# Patient Record
Sex: Male | Born: 1950 | Race: White | Hispanic: No | Marital: Married | Smoking: Never smoker
Health system: Southern US, Community
[De-identification: ages and names within clinical notes are randomized; demographics above are authoritative.]

## PROBLEM LIST (undated history)

## (undated) DIAGNOSIS — E785 Hyperlipidemia, unspecified: Secondary | ICD-10-CM

## (undated) DIAGNOSIS — J309 Allergic rhinitis, unspecified: Secondary | ICD-10-CM

## (undated) DIAGNOSIS — K4090 Unilateral inguinal hernia, without obstruction or gangrene, not specified as recurrent: Secondary | ICD-10-CM

## (undated) DIAGNOSIS — T7840XA Allergy, unspecified, initial encounter: Secondary | ICD-10-CM

## (undated) HISTORY — DX: Allergic rhinitis, unspecified: J30.9

## (undated) HISTORY — DX: Allergy, unspecified, initial encounter: T78.40XA

## (undated) HISTORY — PX: INGUINAL HERNIA REPAIR: SUR1180

## (undated) HISTORY — DX: Unilateral inguinal hernia, without obstruction or gangrene, not specified as recurrent: K40.90

## (undated) HISTORY — PX: CARDIAC SURGERY: SHX584

## (undated) HISTORY — DX: Hyperlipidemia, unspecified: E78.5

---

## 1990-01-18 HISTORY — PX: OTHER SURGICAL HISTORY: SHX169

## 2003-12-06 ENCOUNTER — Ambulatory Visit: Payer: Self-pay | Admitting: Internal Medicine

## 2005-05-06 ENCOUNTER — Ambulatory Visit: Payer: Self-pay | Admitting: Internal Medicine

## 2005-05-12 ENCOUNTER — Ambulatory Visit: Payer: Self-pay | Admitting: Internal Medicine

## 2006-08-24 ENCOUNTER — Ambulatory Visit: Payer: Self-pay | Admitting: Internal Medicine

## 2006-08-24 IMAGING — CR DG HAND COMPLETE 3+V*R*
2 series · 2 of 2 positions shown · non-contrast
Comparison: none

CLINICAL DATA: 5th digit pain and index finger pain.  
 RIGHT HAND ?3 VIEW:

[view not recorded (1 of 2)]
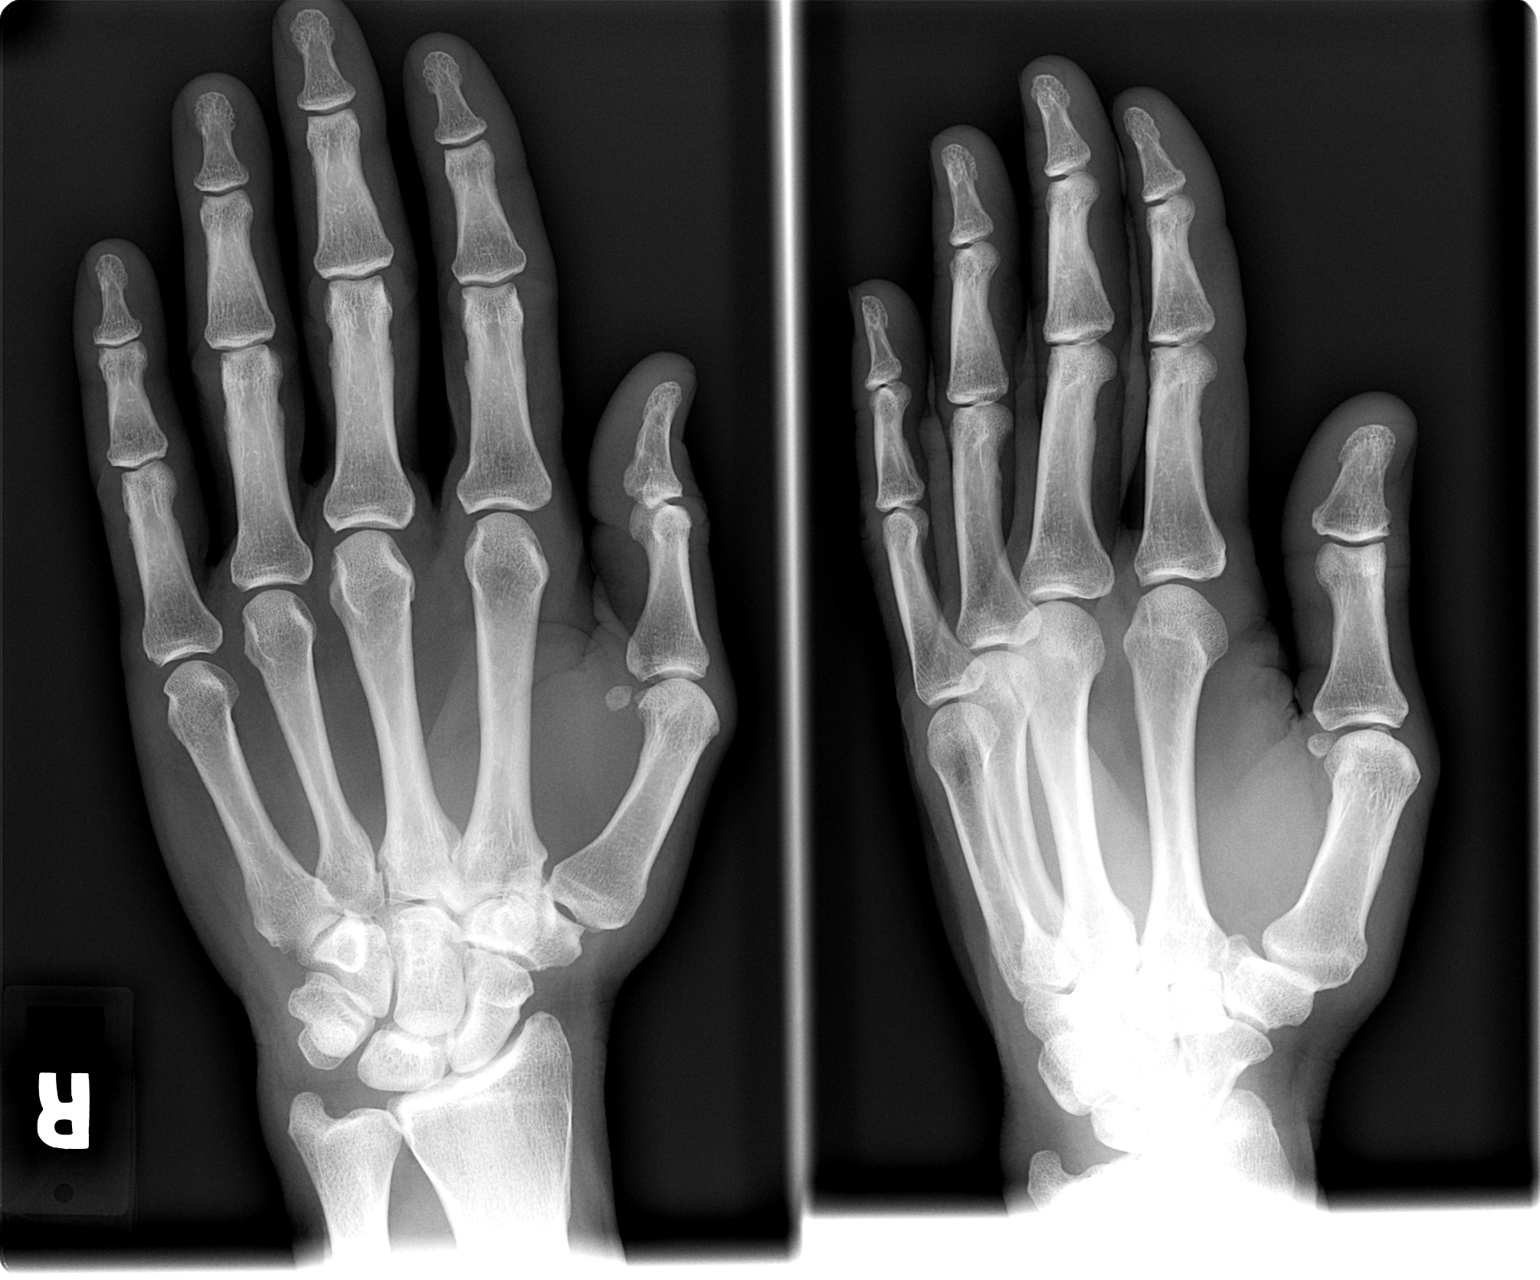

[view not recorded (2 of 2)]
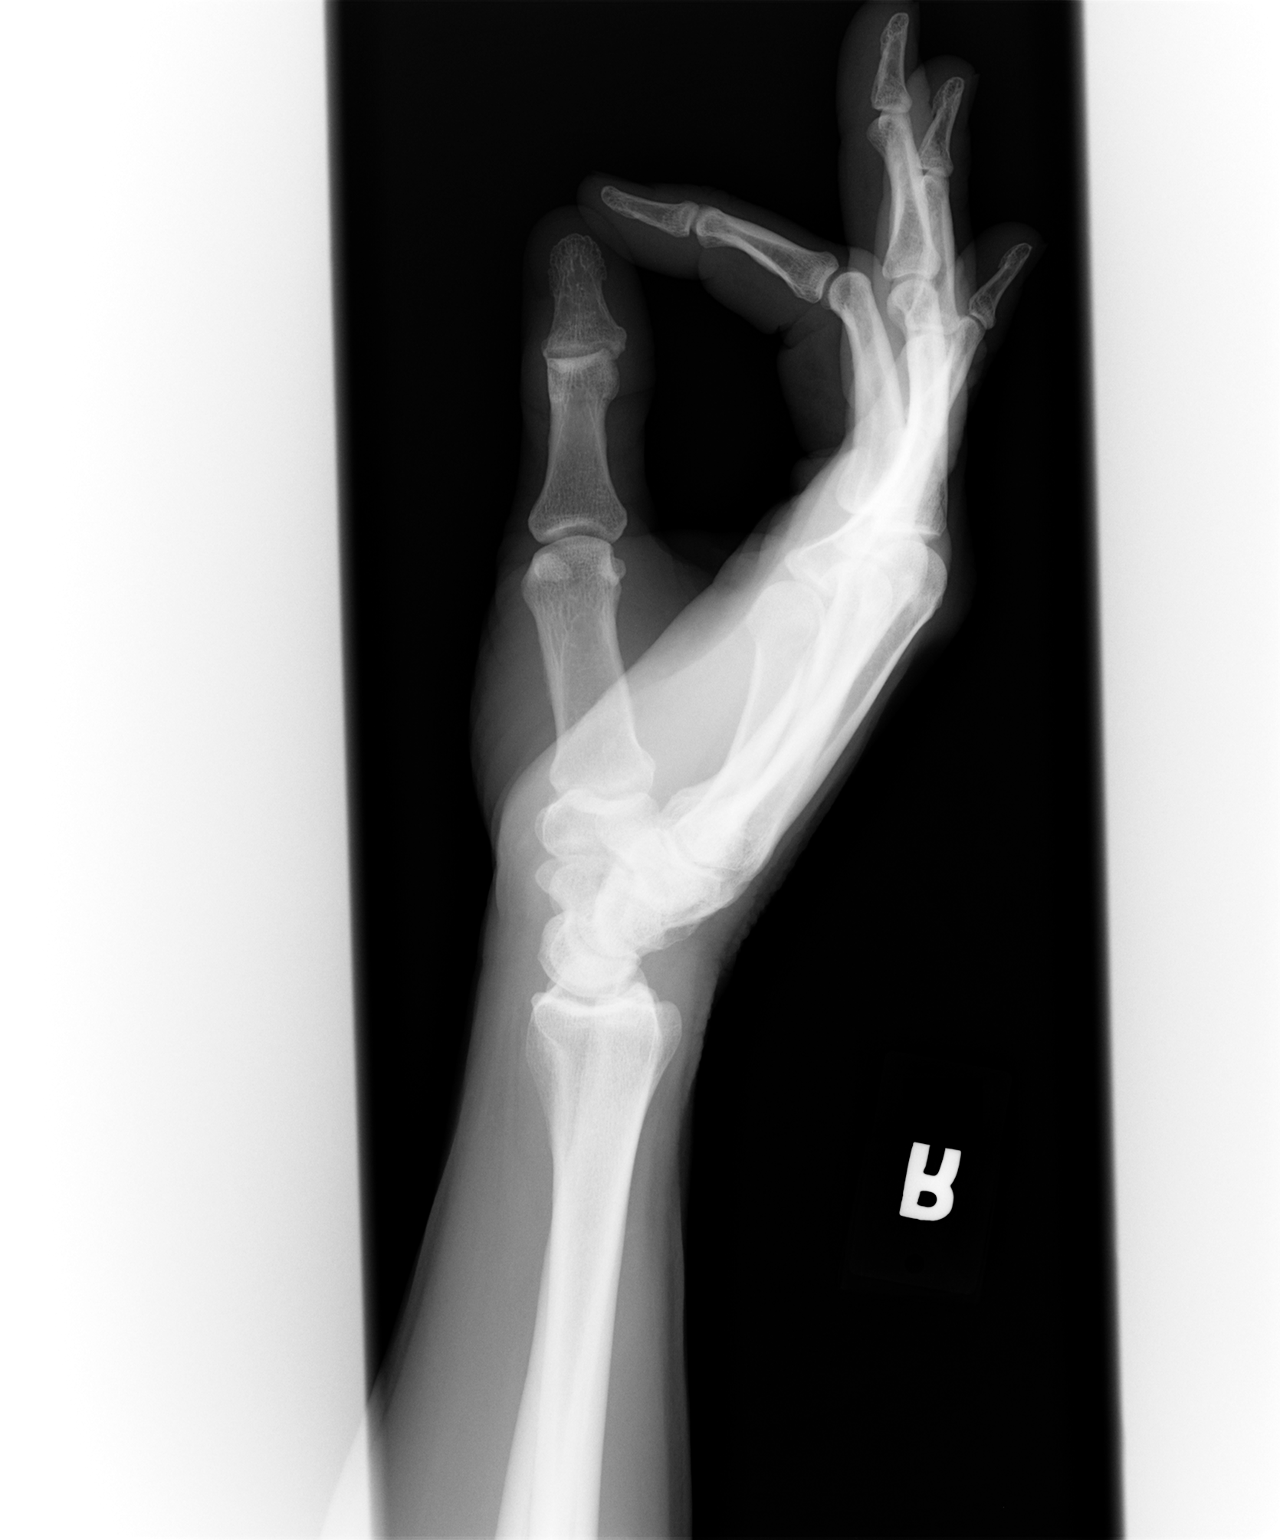

[2 of 2 positions shown; findings below may reference images not displayed]

FINDINGS: No acute osseous or joint abnormality.  No significant degenerative changes.
IMPRESSION: No acute or chronic findings.

## 2007-06-29 ENCOUNTER — Ambulatory Visit: Payer: Self-pay | Admitting: Internal Medicine

## 2007-06-29 DIAGNOSIS — E785 Hyperlipidemia, unspecified: Secondary | ICD-10-CM

## 2007-06-29 DIAGNOSIS — J309 Allergic rhinitis, unspecified: Secondary | ICD-10-CM

## 2007-06-29 HISTORY — DX: Allergic rhinitis, unspecified: J30.9

## 2007-06-29 HISTORY — DX: Hyperlipidemia, unspecified: E78.5

## 2007-06-29 LAB — CONVERTED CEMR LAB
Basophils Absolute: 0.1 10*3/uL (ref 0.0–0.1)
Bilirubin Urine: NEGATIVE
Bilirubin, Direct: 0.1 mg/dL (ref 0.0–0.3)
CO2: 31 meq/L (ref 19–32)
Calcium: 8.6 mg/dL (ref 8.4–10.5)
Eosinophils Absolute: 0.5 10*3/uL (ref 0.0–0.7)
GFR calc Af Amer: 128 mL/min
GFR calc non Af Amer: 106 mL/min
HDL: 38.4 mg/dL — ABNORMAL LOW (ref >39.0)
Hemoglobin: 14.5 g/dL (ref 13.0–17.0)
Ketones, ur: NEGATIVE mg/dL
LDL Cholesterol: 141 mg/dL — ABNORMAL HIGH (ref 0–99)
Leukocytes, UA: NEGATIVE
Lymphocytes Relative: 31.6 % (ref 12.0–46.0)
MCHC: 34.8 g/dL (ref 30.0–36.0)
Monocytes Absolute: 0.6 10*3/uL (ref 0.1–1.0)
Neutro Abs: 2.7 10*3/uL (ref 1.4–7.7)
RDW: 13 % (ref 11.5–14.6)
Sodium: 135 meq/L (ref 135–145)
TSH: 0.94 microintl units/mL (ref 0.35–5.50)
Total Bilirubin: 0.9 mg/dL (ref 0.3–1.2)
Total CHOL/HDL Ratio: 4.9
Triglycerides: 44 mg/dL (ref 0–149)
pH: 7 (ref 5.0–8.0)

## 2007-07-07 ENCOUNTER — Telehealth: Payer: Self-pay | Admitting: Internal Medicine

## 2008-10-29 ENCOUNTER — Emergency Department (HOSPITAL_COMMUNITY): Admission: EM | Admit: 2008-10-29 | Discharge: 2008-10-29 | Payer: Self-pay | Admitting: Family Medicine

## 2011-06-16 ENCOUNTER — Telehealth: Payer: Self-pay

## 2011-06-16 NOTE — Telephone Encounter (Signed)
Ok with me 

## 2011-06-16 NOTE — Telephone Encounter (Signed)
Message copied by Pincus Sanes on Wed Jun 16, 2011  3:43 PM ------      Message from: COUSIN, [place] T      Created: Tue Jun 15, 2011  4:57 PM      Regarding: KNOTS       Pt says he need to be seen for knots on him--LOV:  2009---pt is scheduled 06/30/11 in a 30 minute slot---pt desire to be worked in sooner---Thank you for your reply--

## 2011-06-17 NOTE — Telephone Encounter (Signed)
Pt sched'd June 6 @ 2p---

## 2011-06-19 ENCOUNTER — Encounter: Payer: Self-pay | Admitting: Internal Medicine

## 2011-06-19 DIAGNOSIS — Z0001 Encounter for general adult medical examination with abnormal findings: Secondary | ICD-10-CM | POA: Insufficient documentation

## 2011-06-19 DIAGNOSIS — Z0000 Encounter for general adult medical examination without abnormal findings: Secondary | ICD-10-CM | POA: Insufficient documentation

## 2011-06-24 ENCOUNTER — Ambulatory Visit (INDEPENDENT_AMBULATORY_CARE_PROVIDER_SITE_OTHER): Payer: PRIVATE HEALTH INSURANCE | Admitting: Internal Medicine

## 2011-06-24 ENCOUNTER — Encounter: Payer: Self-pay | Admitting: Internal Medicine

## 2011-06-24 ENCOUNTER — Other Ambulatory Visit (INDEPENDENT_AMBULATORY_CARE_PROVIDER_SITE_OTHER): Payer: PRIVATE HEALTH INSURANCE

## 2011-06-24 VITALS — BP 108/76 | HR 67 | Temp 97.4°F | Ht 74.0 in | Wt 188.5 lb

## 2011-06-24 DIAGNOSIS — N320 Bladder-neck obstruction: Secondary | ICD-10-CM | POA: Insufficient documentation

## 2011-06-24 DIAGNOSIS — M79671 Pain in right foot: Secondary | ICD-10-CM

## 2011-06-24 DIAGNOSIS — L989 Disorder of the skin and subcutaneous tissue, unspecified: Secondary | ICD-10-CM

## 2011-06-24 DIAGNOSIS — M79609 Pain in unspecified limb: Secondary | ICD-10-CM

## 2011-06-24 DIAGNOSIS — K4090 Unilateral inguinal hernia, without obstruction or gangrene, not specified as recurrent: Secondary | ICD-10-CM

## 2011-06-24 HISTORY — DX: Unilateral inguinal hernia, without obstruction or gangrene, not specified as recurrent: K40.90

## 2011-06-24 LAB — PSA: PSA: 2.61 ng/mL (ref 0.10–4.00)

## 2011-06-24 MED ORDER — ASPIRIN 81 MG PO TBEC: 81.0000 mg | DELAYED_RELEASE_TABLET | Freq: Every day | ORAL | Status: DC

## 2011-06-24 NOTE — Patient Instructions (Addendum)
Please follow lower cholesterol diet You will be contacted regarding the referral for: General Surgury for the hernia, dermatology for the skin lesion, and podiatry for the foot pain Please go to LAB in the Basement for the blood test to be done today - the PSA only You will be contacted by phone if any changes need to be made immediately.  Otherwise, you will receive a letter about your results with an explanation. Please return in 1 year for your yearly visit, or sooner if needed

## 2011-06-24 NOTE — Assessment & Plan Note (Signed)
With unusual bony swelling to right mid lateral foot with tenderness - for podiatry referral

## 2011-06-24 NOTE — Progress Notes (Signed)
  Subjective:    Patient ID: Alexander Pierce, male    DOB: 05-Mar-1950, 61 y.o.   MRN: [id number]  HPI  Here as new pt to establish;  Overall doing ok, but has increased size and discomfort with right inguinal area for 2-3 months, after some lifting and bending for work.  Also with a tender area to the right lateral mid foot wihtout trauma, fever or hx of gout.    Also with dark raised lesion to the skin worse over 4-6 months, needs derm referral.  No recent worsenig prostatism.  Pt denies chest pain, increased sob or doe, wheezing, orthopnea, PND, increased LE swelling, palpitations, dizziness or syncope.  Pt denies new neurological symptoms such as new headache, or facial or extremity weakness or numbness   Pt denies polydipsia, polyuria.  Pt denies fever, wt loss, night sweats, loss of appetite, or other constitutional symptoms Past Medical History  Diagnosis Date  . HYPERLIPIDEMIA 06/29/2007    Qualifier: Diagnosis of  By: Jonny Ruiz MD, Len Blalock   . ALLERGIC RHINITIS 06/29/2007    Qualifier: Diagnosis of  By: Jonny Ruiz MD, Len Blalock   . Right inguinal hernia 06/24/2011   Past Surgical History  Procedure Date  . Sinus surgury 1992    dr Haroldine Laws    reports that he has never smoked. He has never used smokeless tobacco. He reports that he does not drink alcohol or use illicit drugs. family history includes Hypertension in an unspecified family member; Parkinsonism in an unspecified family member; and Prostate cancer in an unspecified family member. Allergies  Allergen Reactions  . Acetaminophen     REACTION: eyes swell  . Aspirin     REACTION: eyes swell  . Ibuprofen     REACTION: eyes swell  . Penicillins     REACTION: pt cant remember   No current outpatient prescriptions on file prior to visit.   Review of Systems Review of Systems  Constitutional: Negative for diaphoresis and unexpected weight change.  HENT: Negative for tinnitus or hearin gloss   Eyes: Negative for photophobia and visual  disturbance.  Respiratory: Negative for sob, cough Gastrointestinal: Negative for vomiting and blood in stool.  Genitourinary: Negative for hematuria and decreased urine volume.  Musculoskeletal: Negative for gait problem.  Skin: Negative for color change and wound.  Neurological: Negative for tremors and numbness.     Objective:   Physical Exam BP 108/76  Pulse 67  Temp(Src) 97.4 F (36.3 C) (Oral)  Ht 6\' 2"  (1.88 m)  Wt 188 lb 8 oz (85.503 kg)  BMI 24.20 kg/m2  SpO2 96% Physical Exam  VS noted Constitutional: Pt appears well-developed and well-nourished.  HENT: Head: Normocephalic.  Right Ear: External ear normal.  Left Ear: External ear normal.  Eyes: Conjunctivae and EOM are normal. Pupils are equal, round, and reactive to light.  Neck: Normal range of motion. Neck supple.  Cardiovascular: Normal rate and regular rhythm.   Pulmonary/Chest: Effort normal and breath sounds normal.  Abd:  Soft, NT, non-distended, + BS, + mild tender, reducible RIH Neurological: Pt is alert. Not confused Skin: Skin is warm. No erythema. No rash except for reddened tender area to bony protuberance right lateral midfoot; also dark raised 10 mm lesion noted to left abd area Psychiatric: Pt behavior is normal. Thought content normal.     Assessment & Plan:

## 2011-06-24 NOTE — Assessment & Plan Note (Signed)
LUQ abd, enlarging, bening but pt would like removed - refer to derm

## 2011-06-26 ENCOUNTER — Encounter: Payer: Self-pay | Admitting: Internal Medicine

## 2011-06-26 NOTE — Assessment & Plan Note (Signed)
Also for PSA as he is due 

## 2011-06-26 NOTE — Assessment & Plan Note (Signed)
Moderate, mild tender, reducible, for gen surgury referral

## 2011-06-30 ENCOUNTER — Ambulatory Visit: Payer: Self-pay | Admitting: Internal Medicine

## 2011-07-09 ENCOUNTER — Ambulatory Visit (INDEPENDENT_AMBULATORY_CARE_PROVIDER_SITE_OTHER): Payer: PRIVATE HEALTH INSURANCE | Admitting: Surgery

## 2011-07-09 ENCOUNTER — Encounter (INDEPENDENT_AMBULATORY_CARE_PROVIDER_SITE_OTHER): Payer: Self-pay | Admitting: Surgery

## 2011-07-09 VITALS — BP 126/86 | HR 68 | Temp 98.4°F | Resp 16 | Ht 74.0 in | Wt 186.6 lb

## 2011-07-09 DIAGNOSIS — K4090 Unilateral inguinal hernia, without obstruction or gangrene, not specified as recurrent: Secondary | ICD-10-CM

## 2011-07-09 DIAGNOSIS — K4020 Bilateral inguinal hernia, without obstruction or gangrene, not specified as recurrent: Secondary | ICD-10-CM

## 2011-07-09 NOTE — Progress Notes (Addendum)
Re:   Alexander Pierce DOB:   [birth year] MRN:   [id number]  ASSESSMENT AND PLAN: 1.  Bilateral inguinal hernias.    Right larger than left.  He came to me for only a right sided hernia.  I discussed the indications and complications of hernia surgery with the patient.  I discussed both the laparoscopic and open approach to hernia repair.  The potential risks of hernia surgery include, but are not limited to, bleeding, infection, open surgery, nerve injury, and recurrence of the hernia.  I provided the patient literature about hernia surgery.  Because the hernias are bilateral, I think he would be best served with a laparoscopic repair.  We talked about insurance issues.  [[facility] about [facility] night stay.  We will reserve a bed, though he may be able to go home the same day.  DN  09/01/2011]  2.  Hyperlipidemia. 3.  Bladder neck obstruction.  REFERRING PHYSICIAN: [name], MD  HISTORY OF PRESENT ILLNESS: Alexander Pierce is a 61 y.o. (DOB: 08-Feb-1950)  white male whose primary care physician is [name], MD and comes to me today for right inguinal hernia.  The patient noticed a burning in his right groin about 2 months ago. At first he thought he got a chemical on his skin, then he noticed a bulge in his right groin. He saw Dr. [name] who diagnosed a right inguinal hernia and referred him to our office. He got my name through friends and meeting my wife at the farmers market.  He has no history of prior abdominal surgery or problems. He has not had a colonoscopy.  He has no history of stomach disease.  No history of liver disease.  No history of gall bladder disease.  No history of pancreas disease.  No history of colon disease.    Past Medical History  Diagnosis Date  . HYPERLIPIDEMIA 06/29/2007    Qualifier: Diagnosis of  By: Jonny Ruiz MD, Len Blalock   . ALLERGIC RHINITIS 06/29/2007    Qualifier: Diagnosis of  By: Jonny Ruiz MD, Len Blalock   . Right inguinal hernia 06/24/2011    Past  Surgical History  Procedure Date  . Sinus surgury 1992    dr Haroldine Laws    No current outpatient prescriptions on file.    Allergies  Allergen Reactions  . Acetaminophen     REACTION: eyes swell  . Aspirin     REACTION: eyes swell  . Ibuprofen     REACTION: eyes swell  . Penicillins     REACTION: pt cant remember    REVIEW OF SYSTEMS: Skin:  No history of rash.  No history of abnormal moles. Infection:  No history of hepatitis or HIV.  No history of MRSA. Neurologic:  No history of stroke.  No history of seizure.  No history of headaches. Cardiac:  No history of hypertension. No history of heart disease.  No history of prior cardiac catheterization.  No history of seeing a cardiologist. Pulmonary:  Does not smoke cigarettes.  No asthma or bronchitis.  No OSA/CPAP.  Endocrine:  No diabetes. No thyroid disease. Gastrointestinal:  See HPI. Urologic:  No history of kidney stones.  No history of bladder infections. Musculoskeletal:  No history of joint or back disease. Hematologic:  No bleeding disorder.  No history of anemia.  Not anticoagulated. Psycho-social:  The patient is oriented.   The patient has no obvious psychologic or social impairment to understanding our conversation and plan.  SOCIAL and FAMILY HISTORY: Comes by self. Works the [employer] site [employer] business.  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  PHYSICAL EXAM: BP 126/86  Pulse 68  Temp 98.4 F (36.9 C) (Temporal)  Resp 16  Ht 6\' 2"  (1.88 m)  Wt 186 lb 9.6 oz (84.641 kg)  BMI 23.96 kg/m2  General: WN WM who is alert and generally healthy appearing.  HEENT: Normal. Pupils equal. Good dentition. Neck: Supple. No mass.  No thyroid mass. Lymph Nodes:  No supraclavicular or cervical nodes. Lungs: Clear to auscultation and symmetric breath sounds. Heart:  RRR. No murmur or rub.  Abdomen: Soft. No mass. No tenderness. Normal bowel sounds.  No abdominal scars.  He has a medium sized right inguinal hernia.  He has  a small left inguinal hernia.  Both are reducible. Rectal: Not done. Extremities:  Good strength and ROM  in upper and lower extremities. Neurologic:  Grossly intact to motor and sensory function. Psychiatric: Has normal mood and affect. Behavior is normal.   DATA REVIEWED: Dr. Raphael Gibney notes.  Ovidio Kin, MD,  [facility] [address] [place]  [address]   [place], [place]    [address] Phone:  [phone] FAX:  [phone]

## 2011-07-16 ENCOUNTER — Ambulatory Visit (INDEPENDENT_AMBULATORY_CARE_PROVIDER_SITE_OTHER): Payer: PRIVATE HEALTH INSURANCE | Admitting: Surgery

## 2011-08-05 ENCOUNTER — Telehealth (INDEPENDENT_AMBULATORY_CARE_PROVIDER_SITE_OTHER): Payer: Self-pay | Admitting: General Surgery

## 2011-08-05 NOTE — Telephone Encounter (Signed)
Pt called to ask to speak with Dr. Ezzard Standing.  Stated he had been seen in June for BIH and is working with schedulers to get on the OR schedule soon.  He is having some additional discomfort and thinks it may be related to his back, and would like Dr. Allene Pyo opinion.  I offered to hear his concerns, but he prefers speaking with the physician.

## 2011-08-06 ENCOUNTER — Telehealth (INDEPENDENT_AMBULATORY_CARE_PROVIDER_SITE_OTHER): Payer: Self-pay | Admitting: General Surgery

## 2011-08-06 NOTE — Telephone Encounter (Signed)
Pt called; stated he missed Dr. Allene Pyo call yesterday.  Explained DN was on vacation at this time, so unlikely he will try to call back before he returns.  Pt understands.

## 2011-09-02 DIAGNOSIS — K4020 Bilateral inguinal hernia, without obstruction or gangrene, not specified as recurrent: Secondary | ICD-10-CM

## 2011-09-14 ENCOUNTER — Telehealth (INDEPENDENT_AMBULATORY_CARE_PROVIDER_SITE_OTHER): Payer: Self-pay | Admitting: General Surgery

## 2011-09-14 NOTE — Telephone Encounter (Signed)
Spoke with pt and informed him he has an appt w/ Dr. Ezzard Standing on 9/5 and to arrive at 2:45 for a 3:00 appt.

## 2011-09-23 ENCOUNTER — Ambulatory Visit (INDEPENDENT_AMBULATORY_CARE_PROVIDER_SITE_OTHER): Payer: PRIVATE HEALTH INSURANCE | Admitting: Surgery

## 2011-09-23 ENCOUNTER — Encounter (INDEPENDENT_AMBULATORY_CARE_PROVIDER_SITE_OTHER): Payer: Self-pay | Admitting: Surgery

## 2011-09-23 VITALS — BP 132/76 | HR 72 | Temp 98.4°F | Resp 18 | Ht 74.0 in | Wt 189.0 lb

## 2011-09-23 DIAGNOSIS — K4020 Bilateral inguinal hernia, without obstruction or gangrene, not specified as recurrent: Secondary | ICD-10-CM

## 2011-09-23 NOTE — Progress Notes (Signed)
Post op  See 07/09/2011 note. He had a lap bilateral inguinal hernia repair at [facility] on 09/02/2011.  He has done well.  Some pain along right anterior thigh. Otherwise no problems.  Wounds look good.  Ovidio Kin, MD, [facility] Pager: [phone] Office phone:  [phone]

## 2012-11-16 ENCOUNTER — Telehealth: Payer: Self-pay

## 2012-11-16 DIAGNOSIS — Z0000 Encounter for general adult medical examination without abnormal findings: Secondary | ICD-10-CM

## 2012-11-16 NOTE — Telephone Encounter (Signed)
Labs entered.

## 2013-01-03 ENCOUNTER — Encounter: Payer: PRIVATE HEALTH INSURANCE | Admitting: Internal Medicine

## 2013-02-07 ENCOUNTER — Ambulatory Visit (INDEPENDENT_AMBULATORY_CARE_PROVIDER_SITE_OTHER): Payer: BC Managed Care – PPO | Admitting: Internal Medicine

## 2013-02-07 ENCOUNTER — Other Ambulatory Visit (INDEPENDENT_AMBULATORY_CARE_PROVIDER_SITE_OTHER): Payer: BC Managed Care – PPO

## 2013-02-07 ENCOUNTER — Encounter: Payer: Self-pay | Admitting: Internal Medicine

## 2013-02-07 VITALS — BP 112/78 | HR 74 | Temp 97.1°F | Ht 74.0 in | Wt 200.4 lb

## 2013-02-07 DIAGNOSIS — M25561 Pain in right knee: Secondary | ICD-10-CM | POA: Insufficient documentation

## 2013-02-07 DIAGNOSIS — N401 Enlarged prostate with lower urinary tract symptoms: Secondary | ICD-10-CM | POA: Insufficient documentation

## 2013-02-07 DIAGNOSIS — N138 Other obstructive and reflux uropathy: Secondary | ICD-10-CM | POA: Insufficient documentation

## 2013-02-07 DIAGNOSIS — N529 Male erectile dysfunction, unspecified: Secondary | ICD-10-CM

## 2013-02-07 DIAGNOSIS — Z0000 Encounter for general adult medical examination without abnormal findings: Secondary | ICD-10-CM

## 2013-02-07 DIAGNOSIS — Z5181 Encounter for therapeutic drug level monitoring: Secondary | ICD-10-CM | POA: Insufficient documentation

## 2013-02-07 DIAGNOSIS — Z136 Encounter for screening for cardiovascular disorders: Secondary | ICD-10-CM

## 2013-02-07 DIAGNOSIS — M25569 Pain in unspecified knee: Secondary | ICD-10-CM

## 2013-02-07 DIAGNOSIS — L989 Disorder of the skin and subcutaneous tissue, unspecified: Secondary | ICD-10-CM

## 2013-02-07 LAB — CBC WITH DIFFERENTIAL/PLATELET
BASOS PCT: 0.8 % (ref 0.0–3.0)
Basophils Absolute: 0.1 10*3/uL (ref 0.0–0.1)
EOS ABS: 0.6 10*3/uL (ref 0.0–0.7)
EOS PCT: 7.3 % — AB (ref 0.0–5.0)
HCT: 40 % (ref 39.0–52.0)
HEMOGLOBIN: 13.7 g/dL (ref 13.0–17.0)
Lymphocytes Relative: 28.8 % (ref 12.0–46.0)
Lymphs Abs: 2.2 10*3/uL (ref 0.7–4.0)
MCHC: 34.2 g/dL (ref 30.0–36.0)
MCV: 89.6 fl (ref 78.0–100.0)
MONO ABS: 0.8 10*3/uL (ref 0.1–1.0)
Monocytes Relative: 11 % (ref 3.0–12.0)
NEUTROS ABS: 4 10*3/uL (ref 1.4–7.7)
Neutrophils Relative %: 52.1 % (ref 43.0–77.0)
Platelets: 254 10*3/uL (ref 150.0–400.0)
RBC: 4.46 Mil/uL (ref 4.22–5.81)
RDW: 13.4 % (ref 11.5–14.6)
WBC: 7.7 10*3/uL (ref 4.5–10.5)

## 2013-02-07 LAB — URINALYSIS, ROUTINE W REFLEX MICROSCOPIC
Bilirubin Urine: NEGATIVE
Hgb urine dipstick: NEGATIVE
Ketones, ur: NEGATIVE
Leukocytes, UA: NEGATIVE
NITRITE: NEGATIVE
SPECIFIC GRAVITY, URINE: 1.02 (ref 1.000–1.030)
Total Protein, Urine: NEGATIVE
URINE GLUCOSE: NEGATIVE
UROBILINOGEN UA: 0.2 (ref 0.0–1.0)
WBC UA: NONE SEEN — AB (ref 0–?)
pH: 6.5 (ref 5.0–8.0)

## 2013-02-07 MED ORDER — TAMSULOSIN HCL 0.4 MG PO CAPS: 0.4000 mg | ORAL_CAPSULE | Freq: Every day | ORAL | Status: DC

## 2013-02-07 NOTE — Assessment & Plan Note (Signed)
Ok for flomax, consider urology , check psa

## 2013-02-07 NOTE — Progress Notes (Signed)
Pre-visit discussion using our clinic review tool. No additional management support is needed unless otherwise documented below in the visit note.  

## 2013-02-07 NOTE — Assessment & Plan Note (Signed)
Ok for derm for lesion left upper abd

## 2013-02-07 NOTE — Patient Instructions (Addendum)
Your EKG was OK today Please call your insurance to check on the shingles shot coverage, and return for nurse visit at any time if covered Please take all new medication as prescribed - the flomax for the prostate Please continue all other medications as before, and refills have been done if requested. Please have the pharmacy call with any other refills you may need. Please continue your efforts at being more active, low cholesterol diet, and weight control. You are otherwise up to date with prevention measures today.  You will be contacted regarding the referral for: colonoscopy, as well as Dr Katrinka BlazingSmith in our office (sports medicine) for the right knee  Please go to the LAB in the Basement (turn left off the elevator) for the tests to be done today You will be contacted by phone if any changes need to be made immediately.  Otherwise, you will receive a letter about your results with an explanation, but please check with MyChart first.  Please return in 1 year for your yearly visit, or sooner if needed

## 2013-02-07 NOTE — Assessment & Plan Note (Signed)

## 2013-02-07 NOTE — Assessment & Plan Note (Signed)
?   Ligamentous vs DJD, no falls but with persistent ant knee pain, walks quite a bit for work, for sport med referral, cont knee sleeve prn

## 2013-02-07 NOTE — Progress Notes (Signed)
Subjective:    Patient ID: Alexander Pierce, male    DOB: August 06, 1950, 63 y.o.   MRN: [id number]  HPI  Here for wellness and f/u;  Overall doing ok;  Pt denies CP, worsening SOB, DOE, wheezing, orthopnea, PND, worsening LE edema, palpitations, dizziness or syncope.  Pt denies neurological change such as new headache, facial or extremity weakness.  Pt denies polydipsia, polyuria, or low sugar symptoms. Pt states overall good compliance with treatment and medications, good tolerability, and has been trying to follow lower cholesterol diet.  Pt denies worsening depressive symptoms, suicidal ideation or panic. No fever, night sweats, wt loss, loss of appetite, or other constitutional symptoms.  Pt states good ability with ADL's, has low fall risk, home safety reviewed and adequate, no other significant changes in hearing or vision, and only occasionally active with exercise.  Was sick with viral illness about xmas time last yr. No missed work, no acugte complaints. Did have an episode of distal LLE cellulitis likely after hit by a branch with yardwork, better with home tx with hibiclens and triple antibiotic.  Also with recent right knee pain after mistep going down stairs, no swelling, but soreness to ant knee, better with knee sleeve.  Also with some hesitancy mid stream while urinating. Past Medical History  Diagnosis Date  . HYPERLIPIDEMIA 06/29/2007    Qualifier: Diagnosis of  By: Alexander Pierce, Alexander Pierce   . ALLERGIC RHINITIS 06/29/2007    Qualifier: Diagnosis of  By: Alexander Pierce, Alexander Pierce   . Right inguinal hernia 06/24/2011   Past Surgical History  Procedure Laterality Date  . Sinus surgury  1992    dr Alexander Pierce    reports that he has never smoked. He has never used smokeless tobacco. He reports that he does not drink alcohol or use illicit drugs. family history includes Hypertension in an other family member; Parkinsonism in an other family member; Prostate cancer in an other family member. Allergies  Allergen  Reactions  . Acetaminophen     REACTION: eyes swell  . Aspirin     REACTION: eyes swell  . Ibuprofen     REACTION: eyes swell  . Penicillins     REACTION: pt cant remember   No current outpatient prescriptions on file prior to visit.   No current facility-administered medications on file prior to visit.   Review of Systems Constitutional: Negative for diaphoresis, activity change, appetite change or unexpected weight change.  HENT: Negative for hearing loss, ear pain, facial swelling, mouth sores and neck stiffness.   Eyes: Negative for pain, redness and visual disturbance.  Respiratory: Negative for shortness of breath and wheezing.   Cardiovascular: Negative for chest pain and palpitations.  Gastrointestinal: Negative for diarrhea, blood in stool, abdominal distention or other pain Genitourinary: Negative for hematuria, flank pain or change in urine volume.  Musculoskeletal: Negative for myalgias and joint swelling.  Skin: Negative for color change and wound.  Neurological: Negative for syncope and numbness. other than noted Hematological: Negative for adenopathy.  Psychiatric/Behavioral: Negative for hallucinations, self-injury, decreased concentration and agitation.      Objective:   Physical Exam BP 112/78  Pulse 74  Temp(Src) 97.1 F (36.2 C) (Oral)  Ht 6\' 2"  (1.88 m)  Wt 200 lb 6 oz (90.89 kg)  BMI 25.72 kg/m2  SpO2 97% VS noted,  Constitutional: Pt is oriented to person, place, and time. Appears well-developed and well-nourished.  Head: Normocephalic and atraumatic.  Right Ear: External ear normal.  Left  Ear: External ear normal.  Nose: Nose normal.  Mouth/Throat: Oropharynx is clear and moist.  Eyes: Conjunctivae and EOM are normal. Pupils are equal, round, and reactive to light.  Neck: Normal range of motion. Neck supple. No JVD present. No tracheal deviation present.  Cardiovascular: Normal rate, regular rhythm, normal heart sounds and intact distal pulses.    Pulmonary/Chest: Effort normal and breath sounds normal.  Abdominal: Soft. Bowel sounds are normal. There is no tenderness. No HSM  Musculoskeletal: Normal range of motion. Exhibits no edema.  Lymphadenopathy:  Has no cervical adenopathy.  Neurological: Pt is alert and oriented to person, place, and time. Pt has normal reflexes. No cranial nerve deficit.  Skin: Skin is warm and dry. No rash noted.  Psychiatric:  Has  normal mood and affect. Behavior is normal.  Right knee with crepitus , no effusion, decr ROM    Assessment & Plan:

## 2013-02-08 ENCOUNTER — Encounter: Payer: Self-pay | Admitting: Internal Medicine

## 2013-02-08 LAB — BASIC METABOLIC PANEL
BUN: 11 mg/dL (ref 6–23)
CHLORIDE: 100 meq/L (ref 96–112)
CO2: 30 mEq/L (ref 19–32)
CREATININE: 0.9 mg/dL (ref 0.4–1.5)
Calcium: 8.7 mg/dL (ref 8.4–10.5)
GFR: 95.05 mL/min (ref >60.00)
Glucose, Bld: 85 mg/dL (ref 70–99)
Potassium: 4.4 mEq/L (ref 3.5–5.1)
Sodium: 135 mEq/L (ref 135–145)

## 2013-02-08 LAB — TSH: TSH: 1.15 u[IU]/mL (ref 0.35–5.50)

## 2013-02-08 LAB — HEPATIC FUNCTION PANEL
ALT: 26 U/L (ref 0–53)
AST: 24 U/L (ref 0–37)
Albumin: 3.8 g/dL (ref 3.5–5.2)
Alkaline Phosphatase: 62 U/L (ref 39–117)
BILIRUBIN DIRECT: 0.1 mg/dL (ref 0.0–0.3)
Total Bilirubin: 0.3 mg/dL (ref 0.3–1.2)
Total Protein: 7.1 g/dL (ref 6.0–8.3)

## 2013-02-08 LAB — LIPID PANEL
CHOL/HDL RATIO: 5
Cholesterol: 193 mg/dL (ref 0–200)
HDL: 39.5 mg/dL (ref >39.00)
LDL CALC: 136 mg/dL — AB (ref 0–99)
TRIGLYCERIDES: 88 mg/dL (ref 0.0–149.0)
VLDL: 17.6 mg/dL (ref 0.0–40.0)

## 2013-02-08 LAB — PSA: PSA: 2.92 ng/mL (ref 0.10–4.00)

## 2013-02-08 LAB — TESTOSTERONE: Testosterone: 272.67 ng/dL — ABNORMAL LOW (ref 350.00–890.00)

## 2013-02-15 ENCOUNTER — Encounter: Payer: Self-pay | Admitting: Family Medicine

## 2013-02-15 ENCOUNTER — Other Ambulatory Visit (INDEPENDENT_AMBULATORY_CARE_PROVIDER_SITE_OTHER): Payer: BC Managed Care – PPO

## 2013-02-15 ENCOUNTER — Ambulatory Visit (INDEPENDENT_AMBULATORY_CARE_PROVIDER_SITE_OTHER): Payer: BC Managed Care – PPO | Admitting: Family Medicine

## 2013-02-15 VITALS — BP 132/74 | HR 78 | Temp 98.2°F | Resp 16 | Wt 200.0 lb

## 2013-02-15 DIAGNOSIS — M25569 Pain in unspecified knee: Secondary | ICD-10-CM

## 2013-02-15 DIAGNOSIS — M25561 Pain in right knee: Secondary | ICD-10-CM

## 2013-02-15 DIAGNOSIS — M1710 Unilateral primary osteoarthritis, unspecified knee: Secondary | ICD-10-CM

## 2013-02-15 MED ORDER — TRAMADOL HCL 50 MG PO TABS: 50.0000 mg | ORAL_TABLET | Freq: Every evening | ORAL | Status: DC | PRN

## 2013-02-15 NOTE — Patient Instructions (Signed)
Very nice to meet you You have some arthritis.  Tramadol at night.  Take tylenol 650 mg three times a day is the best evidence based medicine we have for arthritis.  Glucosamine sulfate 750mg  twice a day is a supplement that has been shown to help moderate to severe arthritis. Vitamin D 2000 IU daily Fish oil 2 grams daily.  Tumeric 500mg  twice daily.  Capsaicin topically up to four times a day may also help with pain. Cortisone injections are an option if these interventions do not seem to make a difference or need more relief.  If cortisone injections do not help, there are different types of shots that may help but they take longer to take effect.  We can discuss this at follow up.  It's important that you continue to stay active. Controlling your weight is important.  Consider physical therapy to strengthen muscles around the joint that hurts to take pressure off of the joint itself. Shoe inserts with good arch support may be helpful.  [employer] [facility] [employer] [facility] could help.  Water aerobics and cycling with low resistance are the best two types of exercise for arthritis. Exercises most days of the week.  Come back and see me in 3-4 weeks.

## 2013-02-15 NOTE — Progress Notes (Signed)
Pre-visit discussion using our clinic review tool. No additional management support is needed unless otherwise documented below in the visit note.  

## 2013-02-15 NOTE — Assessment & Plan Note (Signed)
Patient's diagnosis, treatment and rehabilitation discussed. I do think patient likely has more osteoarthritis patient does appear that he was very active at one point and I am optimistic he'll do well with conservative therapy. Patient was told over-the-counter medications. Unable to use anti-inflammatories due to patient's allergies. Patient given home exercise program as well as an icing protocol. We did discuss insoles in the shoes they can be beneficial. We discussed bracing of the knee which patient declined. I would like to see patient back again in 4 weeks. If he is continuing to have pain however to do a steroid injection as well as potentially a brace.

## 2013-02-15 NOTE — Progress Notes (Signed)
  I'm seeing this patient by the request  of:  Oliver BarreJames Cyruss, MD   CC: Right knee pain  HPI: Patient is a very nice 63 year old gentleman who is coming in with right knee pain for multiple months duration. Patient does not remember any true injury. 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 Patient is noticed when he gets out of the car he does have significant stiffness. Patient is notice more pain when he is going up or down stairs which he stopped doing secondary to pain. Patient points to the pain mostly on the proximal aspect of the knee just above the patella. Patient states that it feels very tight and describes it as more of a dull sensation. Patient denies any nighttime pain, any radiation of pain or any numbness. Patient rates the pain 3 at 10 in severity. Patient used to be very active but has stopped secondary to his new job as well as the pain in the knee.   Past medical, surgical, family and social history reviewed. Medications reviewed all in the electronic medical record.   Review of Systems: No headache, visual changes, nausea, vomiting, diarrhea, constipation, dizziness, abdominal pain, skin rash, fevers, chills, night sweats, weight loss, swollen lymph nodes, body aches, joint swelling, muscle aches, chest pain, shortness of breath, mood changes.   Objective:    Blood pressure 132/74, pulse 78, temperature 98.2 F (36.8 C), temperature source Oral, resp. rate 16, weight 200 lb (90.719 kg), SpO2 95.00%.   General: No apparent distress alert and oriented x3 mood and affect normal, dressed appropriately.  HEENT: Pupils equal, extraocular movements intact Respiratory: Patient's speak in full sentences and does not appear short of breath Cardiovascular: No lower extremity edema, non tender, no erythema Skin: Warm dry intact with no signs of infection or rash on extremities or on axial skeleton. Abdomen: Soft nontender Neuro: Cranial nerves II through  XII are intact, neurovascularly intact in all extremities with 2+ DTRs and 2+ pulses. Lymph: No lymphadenopathy of posterior or anterior cervical chain or axillae bilaterally.  Gait normal with good balance and coordination.  MSK: Non tender with full range of motion and good stability and symmetric strength and tone of shoulders, elbows, wrist, hip, and ankles bilaterally.  Knee: Right Normal to inspection with no erythema or effusion or obvious bony abnormalities. Mild osteoarthritic changes Palpation reveals patient does have medial and lateral joint line tenderness of the right knee more on the lateral aspect. ROM full in flexion and extension and lower leg rotation. Ligaments with solid consistent endpoints including ACL, PCL, LCL, MCL. Negative Mcmurray's, Apley's, and Thessalonian tests.  painful patellar compression. Patellar glide with mild crepitus. Patellar and quadriceps tendons unremarkable. Hamstring and quadriceps strength is normal.  Contralateral knee unremarkable  MSK US performed of: Right knee This study was ordered, performed, and interpreted by Terrilee FilesZach Khup Sapia D.O.  Knee: All structures visualized. Anteromedial, anterolateral, posteromedial, and posterolateral menisci unremarkable without tearing, fraying, effusion, or displacement. Patient suprapatellar pouch though does have swelling noted. Medial and lateral joint lines do show osteoarthritic changes. Patient patellofemoral joint also has arthritis Patellar Tendon unremarkable on long and transverse views without effusion. No abnormality of prepatellar bursa. LCL and MCL unremarkable on long and transverse views. No abnormality of origin of medial or lateral head of the gastrocnemius.  IMPRESSION:  Osteoarthritic knee with effusion   Impression and Recommendations:     This case required medical decision making of moderate complexity.

## 2013-02-16 ENCOUNTER — Ambulatory Visit: Payer: BC Managed Care – PPO | Admitting: Internal Medicine

## 2013-02-23 ENCOUNTER — Ambulatory Visit (INDEPENDENT_AMBULATORY_CARE_PROVIDER_SITE_OTHER)
Admission: RE | Admit: 2013-02-23 | Discharge: 2013-02-23 | Disposition: A | Discharge status: Home / Self Care | Payer: BC Managed Care – PPO | Source: Ambulatory Visit | Attending: Family Medicine | Admitting: Family Medicine

## 2013-02-23 ENCOUNTER — Ambulatory Visit (INDEPENDENT_AMBULATORY_CARE_PROVIDER_SITE_OTHER): Payer: BC Managed Care – PPO | Admitting: Family Medicine

## 2013-02-23 ENCOUNTER — Encounter: Payer: Self-pay | Admitting: Family Medicine

## 2013-02-23 VITALS — BP 130/90 | HR 67 | Temp 96.8°F | Wt 202.4 lb

## 2013-02-23 DIAGNOSIS — M25569 Pain in unspecified knee: Secondary | ICD-10-CM

## 2013-02-23 DIAGNOSIS — M1710 Unilateral primary osteoarthritis, unspecified knee: Secondary | ICD-10-CM

## 2013-02-23 IMAGING — CR DG KNEE STANDING AP BILAT
1 series · 1 of 1 positions shown · non-contrast
Comparison: None.

CLINICAL DATA: Right knee pain and swelling.  Arthrocentesis today.

EXAM:
BILATERAL KNEES STANDING - 1 VIEW

[view not recorded]
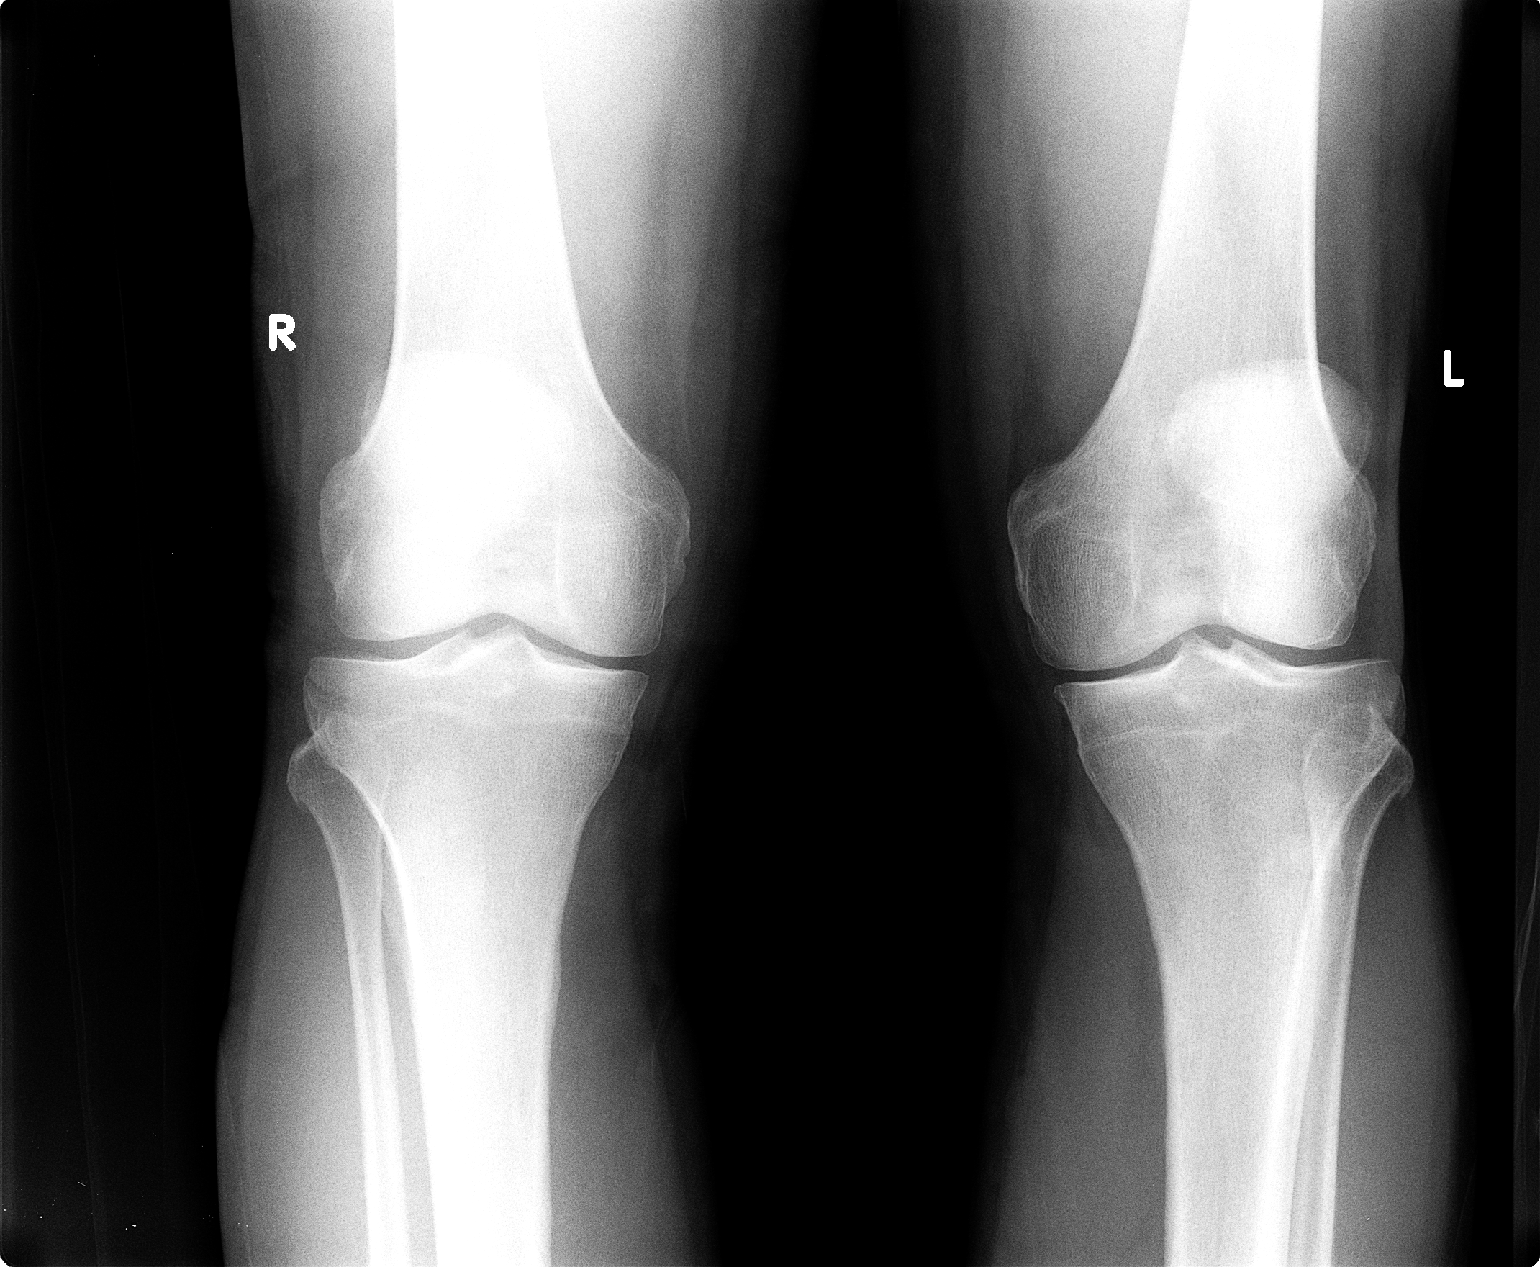

[1 of 1 positions shown; findings below may reference images not displayed]

FINDINGS: Standing AP view of both knees demonstrates mild symmetric medial
joint space loss. There is no evidence of acute fracture or focal
osseous abnormality. The alignment is normal.
IMPRESSION: Mild symmetric medial joint space loss in both knees.

## 2013-02-23 IMAGING — CR DG KNEE COMPLETE 4+V*R*
4 series · 4 of 4 positions shown · non-contrast
Comparison: None.

CLINICAL DATA: Right knee pain and swelling. Arthrocentesis today.

EXAM:
RIGHT KNEE - COMPLETE 4+ VIEW

[view not recorded (1 of 4)]
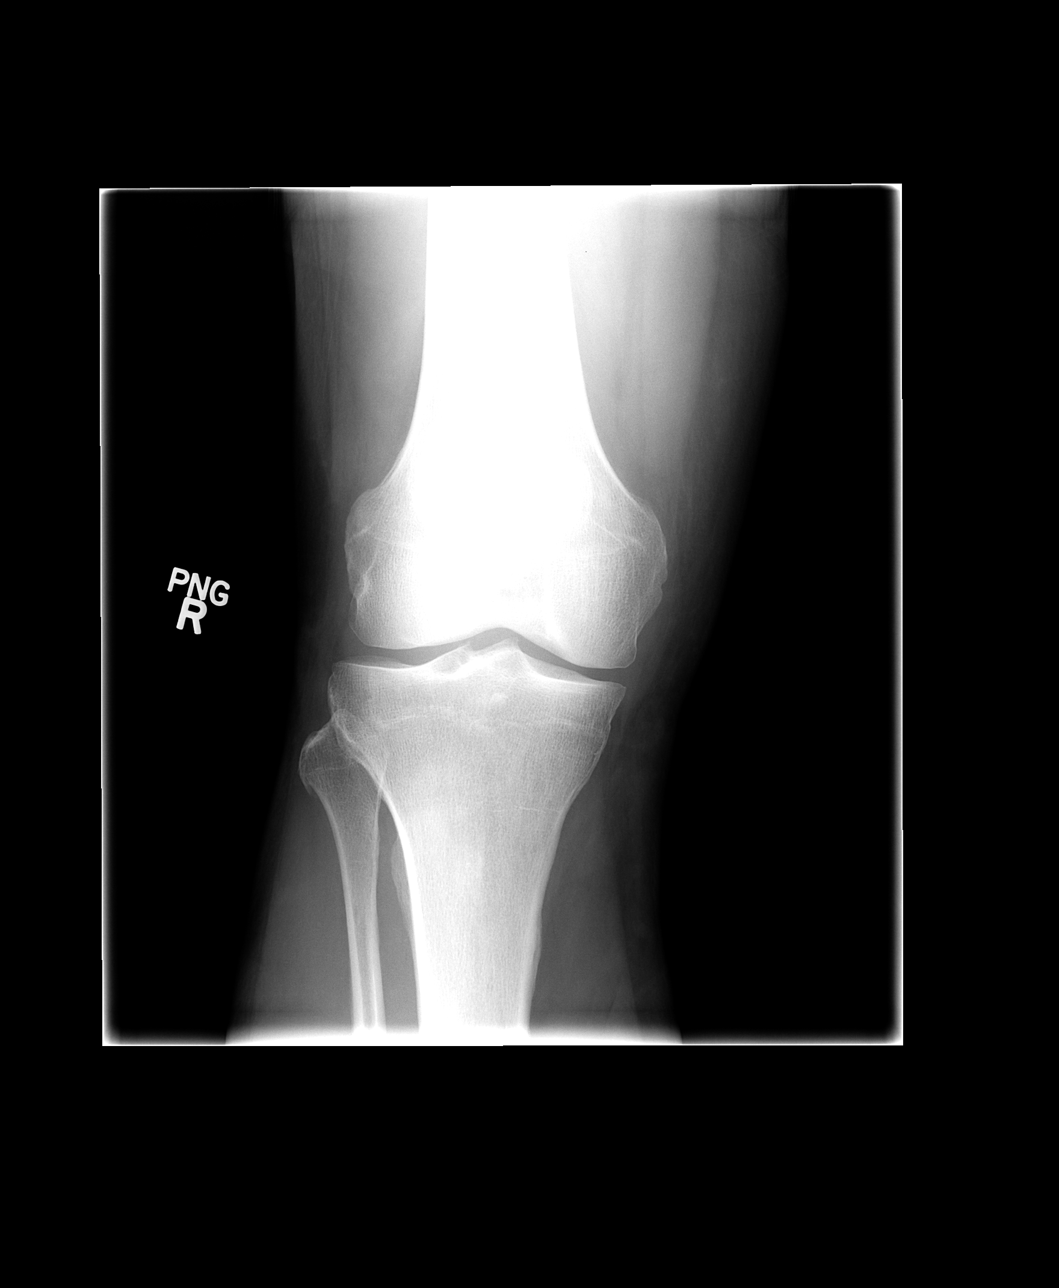

[view not recorded (2 of 4)]
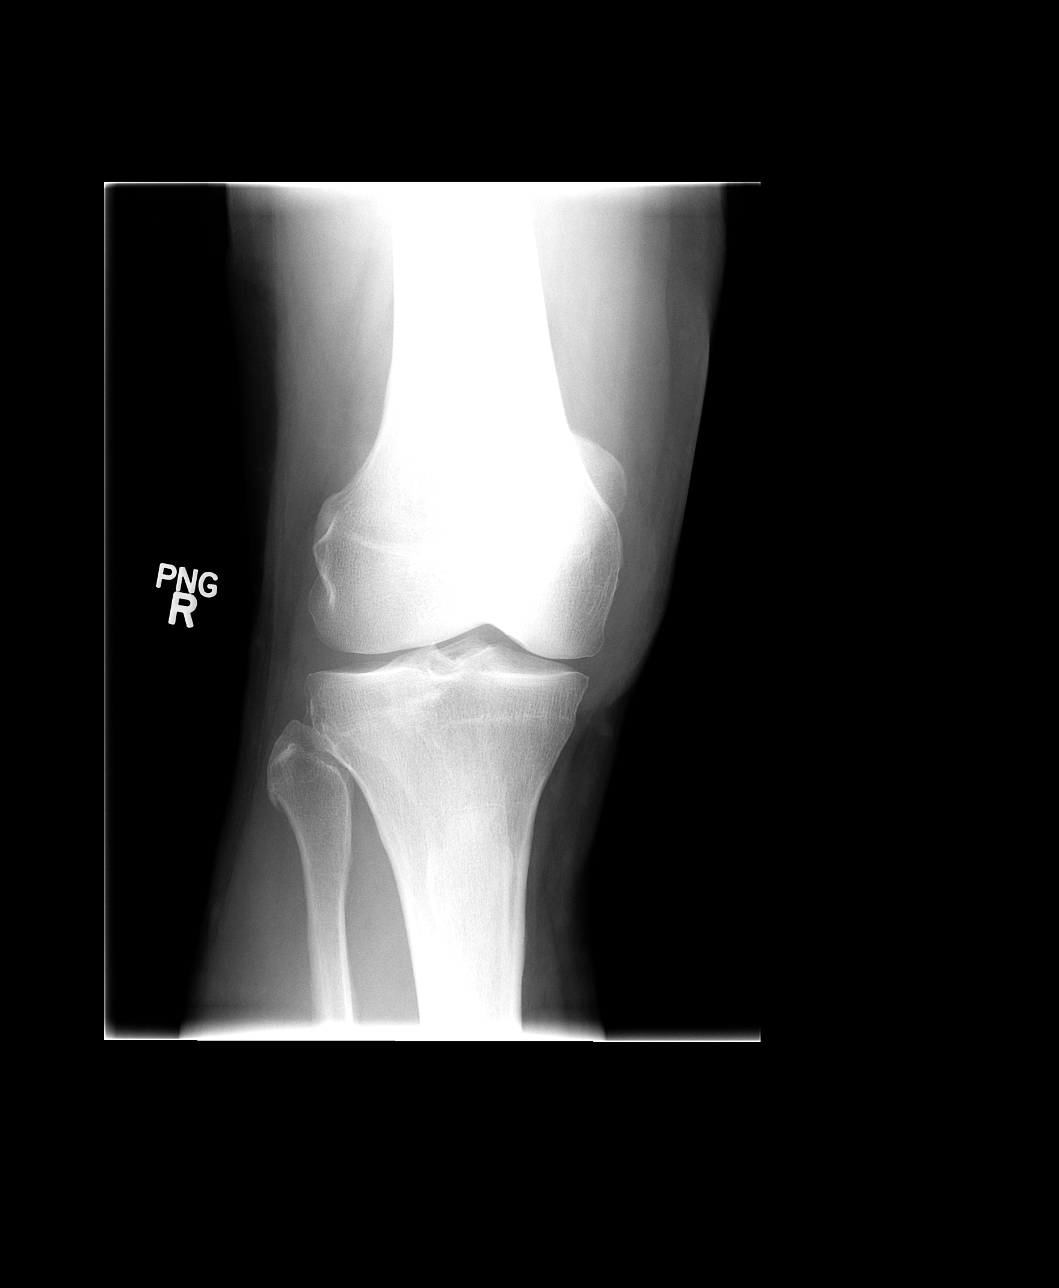

[view not recorded (3 of 4)]
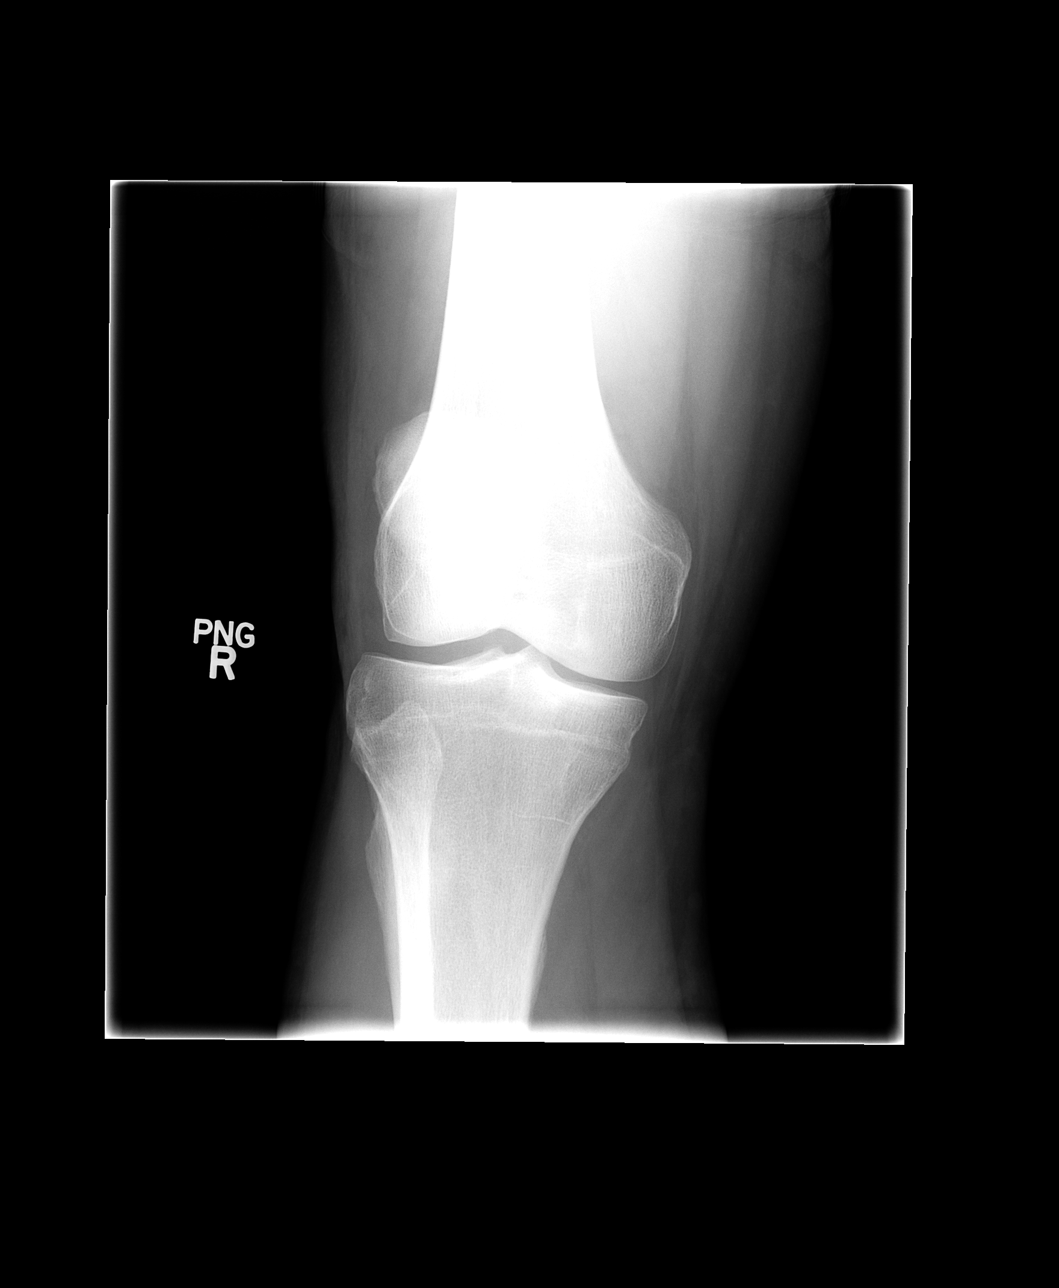

[view not recorded (4 of 4)]
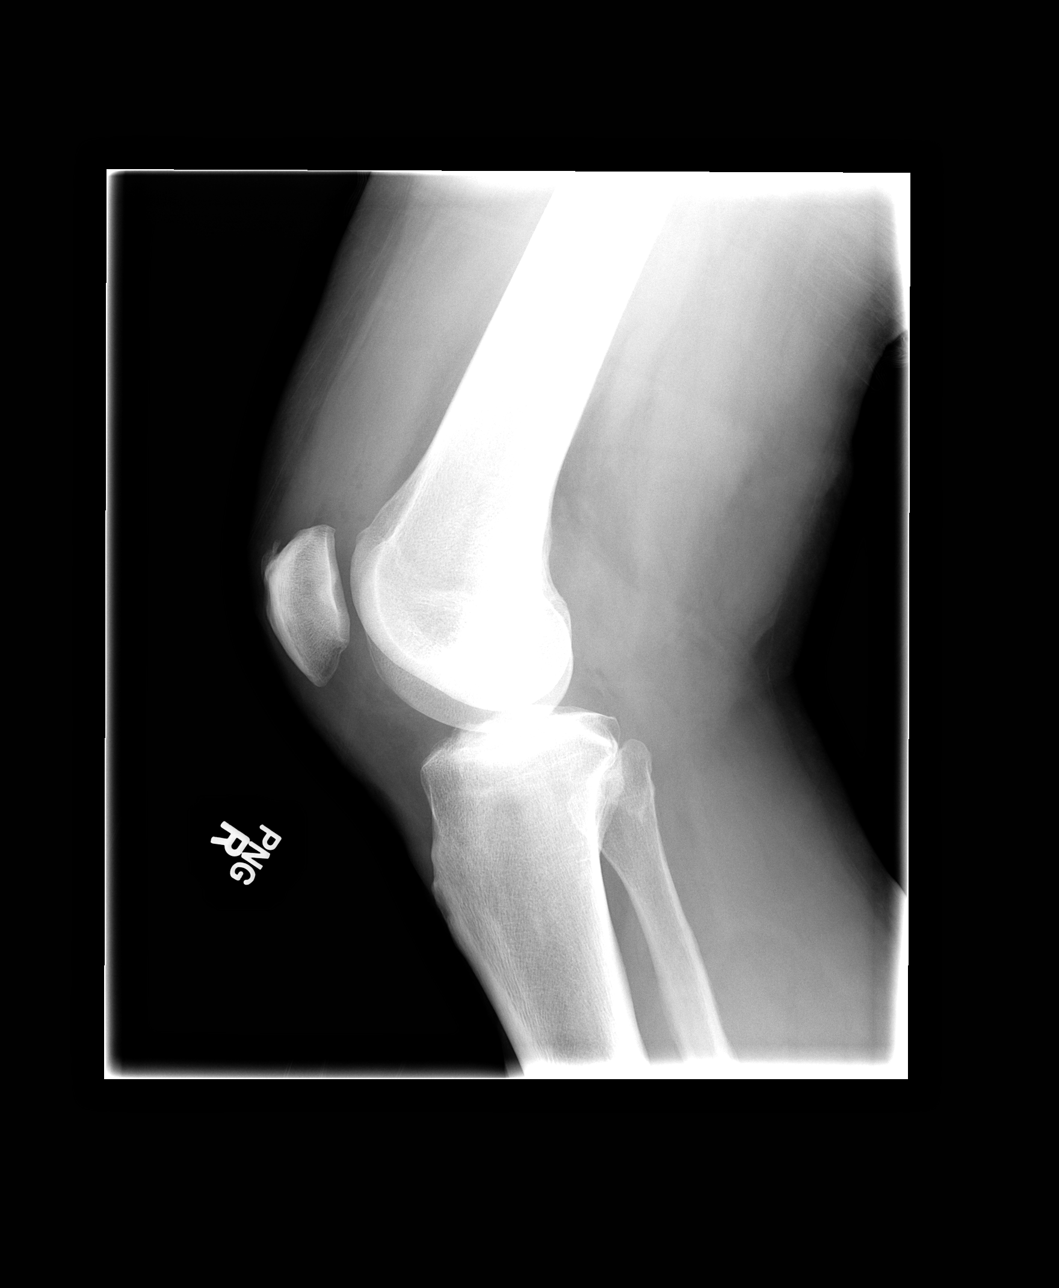

[4 of 4 positions shown; findings below may reference images not displayed]

FINDINGS: The mineralization and alignment are normal. There is no evidence of
acute fracture or dislocation. There is mild medial compartment
joint space loss. There is mild spurring at the quadriceps insertion
on the patella and a small to moderate residual suprapatellar joint
effusion.
IMPRESSION: Small to moderate residual joint effusion following reported
arthrocentesis. No acute osseous findings.

## 2013-02-23 NOTE — Assessment & Plan Note (Signed)
Patient is going to continue to have a recurrent effusion likely secondary to his osteoarthritis. We will evaluate further with x-rays today. Patient is going to wear the compression for the next 72 hours we discussed icing protocol. Encourage him to start the exercises again in 72 hours per Patient and will come back again in 3 weeks for further evaluation. If he continues to have pain we may want to consider Visco supplementation. Before the viscous supplementation we may want to consider another ultrasound to make sure there is no repeat swelling.

## 2013-02-23 NOTE — Progress Notes (Signed)
CC: Right knee pain  HPI: Patient is a very nice 63 year old gentleman who is coming in with right knee pain patient was seen last week and was diagnosed with osteoarthritis with effusion of the knee. Patient stated that he wanted to try conservative therapy. Patient was walking up and down marble stairs all day this weekend unfortunately had increasing swelling of the knee. Patient states that the pain seemed to be worsening as well which makes her trouble to ambulate. Patient states he has noticed swelling and has been wearing a compression sleeve as well as taking ibuprofen without significant improvement. Patient has been trying to take the over-the-counter medicines.  Past medical, surgical, family and social history reviewed. Medications reviewed all in the electronic medical record.   Review of Systems: No headache, visual changes, nausea, vomiting, diarrhea, constipation, dizziness, abdominal pain, skin rash, fevers, chills, night sweats, weight loss, swollen lymph nodes, body aches, joint swelling, muscle aches, chest pain, shortness of breath, mood changes.   Objective:    Blood pressure 130/90, pulse 67, temperature 96.8 F (36 C), temperature source Oral, weight 202 lb 6.4 oz (91.808 kg), SpO2 98.00%.   General: No apparent distress alert and oriented x3 mood and affect normal, dressed appropriately.  HEENT: Pupils equal, extraocular movements intact Respiratory: Patient's speak in full sentences and does not appear short of breath Cardiovascular: No lower extremity edema, non tender, no erythema Skin: Warm dry intact with no signs of infection or rash on extremities or on axial skeleton. Abdomen: Soft nontender Neuro: Cranial nerves II through XII are intact, neurovascularly intact in all extremities with 2+ DTRs and 2+ pulses. Lymph: No lymphadenopathy of posterior or anterior cervical chain or axillae bilaterally.  Gait normal with good balance and coordination.  MSK: Non  tender with full range of motion and good stability and symmetric strength and tone of shoulders, elbows, wrist, hip, and ankles bilaterally.  Knee: Right Normal to inspection with no erythema or obvious bony abnormalities. Mild osteoarthritic changes, patient does have increasing suprapatellar swelling compared to contra lateral side. This is more than previous exam Palpation reveals patient does have medial and lateral joint line tenderness of the right knee more on the lateral aspect. ROM full in flexion and extension and lower leg rotation. Ligaments with solid consistent endpoints including ACL, PCL, LCL, MCL. Negative Mcmurray's, Apley's, and Thessalonian tests.  painful patellar compression. Patellar glide with mild crepitus. Patellar and quadriceps tendons unremarkable. Hamstring and quadriceps strength is normal.  Contralateral knee unremarkable  Procedure note MSK US performed of: Right knee This study was ordered, performed, and interpreted by Terrilee Files D.O.  Knee: All structures visualized. Anteromedial, anterolateral, posteromedial, and posterolateral menisci unremarkable without tearing, fraying, effusion, or displacement. Patient suprapatellar pouch though does have swelling noted. Medial and lateral joint lines do show osteoarthritic changes. Patient patellofemoral joint also has arthritis Patellar Tendon unremarkable on long and transverse views without effusion. No abnormality of prepatellar bursa. LCL and MCL unremarkable on long and transverse views. No abnormality of origin of medial or lateral head of the gastrocnemius. After verbal consent patient was prepped with alcohol swabs and a 27-gauge 1-1/2 inch needle was injected into the lateral superior portion of the knee. Patient did have 5 cc of 0.5% Marcaine injected into the knee and we did drain off 55 cc of clear straw light-colored fluid from the knee. Patient didn't have an injection of 1 cc of Kenalog 40 mg/dL.  Patient tolerated the procedure well and a pressure dressing  was placed. Post-injection instructions given.   Impression and Recommendations:     This case required medical decision making of moderate complexity.

## 2013-02-23 NOTE — Patient Instructions (Signed)
Good to see you I drained your knee today and put in a steroid injection Go downstairs and get xrays today. Set up my chart and I will send you the results Continue the exercises 3 times a week Continue the vitamins.  Ice 20 minutes 2 times a day Would continue the compression sleeve See you in 3 weeks.

## 2013-02-23 NOTE — Progress Notes (Signed)
Pre visit review using our clinic review tool, if applicable. No additional management support is needed unless otherwise documented below in the visit note. 

## 2013-03-16 ENCOUNTER — Ambulatory Visit (INDEPENDENT_AMBULATORY_CARE_PROVIDER_SITE_OTHER): Payer: BC Managed Care – PPO | Admitting: Family Medicine

## 2013-03-16 ENCOUNTER — Encounter: Payer: Self-pay | Admitting: Family Medicine

## 2013-03-16 VITALS — BP 130/90 | HR 67 | Temp 98.0°F | Resp 12 | Wt 202.0 lb

## 2013-03-16 DIAGNOSIS — M47812 Spondylosis without myelopathy or radiculopathy, cervical region: Secondary | ICD-10-CM

## 2013-03-16 DIAGNOSIS — M1710 Unilateral primary osteoarthritis, unspecified knee: Secondary | ICD-10-CM

## 2013-03-16 NOTE — Assessment & Plan Note (Signed)
Patient does have some neck arthritis overall. I think overall this should be mild to moderate. Patient will continue the vitamins, given home exercise program, discussed postural exercises and showed proper technique today. Patient will followup in 3 weeks. If he continues to have pain I would like to get x-rays as well as probably sent to formal physical therapy.

## 2013-03-16 NOTE — Patient Instructions (Signed)
Great to see you For your knee try to do exercises. 2 times a day Ice after activity 20 minutes  Try posture exercise on wall for goal of 5 minutes Tennis ball between shoulder blades when sitting Exercises 3 times a week See you when you need me. Or if neck still hurting maybe 3 week.s

## 2013-03-16 NOTE — Assessment & Plan Note (Signed)
Patient is doing considerably well after the corticosteroid injection. Encourage him to do the home exercises for the knee 2 times a week. Discussed icing protocol at the end of activity. Patient's will come back on an as-needed basis. We can always repeat the steroid injection every 3 months as necessary or he is a candidate for viscous supplementation.

## 2013-03-16 NOTE — Progress Notes (Signed)
  CC: Right knee pain follow up  HPI: Patient is a very nice 63 year old gentleman who is coming in with right knee pain patient was diagnosed with medial knee pain secondary to osteoarthritis. Patient did have x-rays that were reviewed by me. Patient's x-rays did show some moderate narrowing of the medial compartment bilaterally. Patient states after the injection he has had no pain. Patient considers himself 100% better. Patient has not been doing the exercises regularly but is taking the vitamins which has been helpful for most of his aches and pains. Patient is very happy with the results of his knee.  Patient is also complaining of some mild left-sided neck pain. Patient has had this for years and has been told that he had arthritis by a chiropractor. Patient was seen a chiropractor a regular basis and was hoping. Patient denies any radiation in the arms or any numbness or weakness. Patient states that linkages him from time to time. Patient denies that it stops him from any activity or keeps him up at night. Patient with the severity of 3/10.  Past medical, surgical, family and social history reviewed. Medications reviewed all in the electronic medical record.   Review of Systems: No headache, visual changes, nausea, vomiting, diarrhea, constipation, dizziness, abdominal pain, skin rash, fevers, chills, night sweats, weight loss, swollen lymph nodes, body aches, joint swelling, muscle aches, chest pain, shortness of breath, mood changes.   Objective:    Blood pressure 130/90, pulse 67, temperature 98 F (36.7 C), resp. rate 12, weight 202 lb (91.627 kg), SpO2 98.00%.   General: No apparent distress alert and oriented x3 mood and affect normal, dressed appropriately.  HEENT: Pupils equal, extraocular movements intact Respiratory: Patient's speak in full sentences and does not appear short of breath Cardiovascular: No lower extremity edema, non tender, no erythema Skin: Warm dry intact with no  signs of infection or rash on extremities or on axial skeleton. Abdomen: Soft nontender Neuro: Cranial nerves II through XII are intact, neurovascularly intact in all extremities with 2+ DTRs and 2+ pulses. Lymph: No lymphadenopathy of posterior or anterior cervical chain or axillae bilaterally.  Gait normal with good balance and coordination.  MSK: Non tender with full range of motion and good stability and symmetric strength and tone of shoulders, elbows, wrist, hip, and ankles bilaterally.  Knee: Right Normal to inspection with no erythema or obvious bony abnormalities. Mild osteoarthritic changes, no swelling noted.  Palpation reveals patient does have medial and lateral joint line tenderness of the right knee more on the lateral aspect. ROM full in flexion and extension and lower leg rotation. Ligaments with solid consistent endpoints including ACL, PCL, LCL, MCL. Negative Mcmurray's, Apley's, and Thessalonian tests.  painful patellar compression. Patellar glide with mild crepitus. Patellar and quadriceps tendons unremarkable. Hamstring and quadriceps strength is normal.  Contralateral knee unremarkable Neck: Inspection mild increase in kyphosis No palpable stepoffs. Negative Spurling's maneuver. Full neck range of motion with mild limitation of 5 of extension and sided rotation Grip strength and sensation normal in bilateral hands Strength good C4 to T1 distribution No sensory change to C4 to T1 Negative Hoffman sign bilaterally Reflexes normal  Impression and Recommendations:     This case required medical decision making of moderate complexity. Spent greater than 25 minutes with patient face-to-face and had greater than 50% of counseling including as described above in assessment and plan.

## 2013-04-06 ENCOUNTER — Telehealth: Payer: Self-pay

## 2013-04-06 NOTE — Telephone Encounter (Signed)
Phone call from patient [phone] states he has a question for you.  I attempted to call patient to find out exactly what is the question. But no answer and I can not leave a voicemail.

## 2013-04-06 NOTE — Telephone Encounter (Signed)
Please try to call and see what question is if you have time.

## 2013-04-11 ENCOUNTER — Telehealth: Payer: Self-pay

## 2013-04-11 NOTE — Telephone Encounter (Signed)
Called pt, unable to leave a msg.   

## 2013-04-11 NOTE — Telephone Encounter (Signed)
Patient called [facility] requesting a call back regarding his visit with Dr. Katrinka BlazingSmith, did not state details. Thanks

## 2013-04-23 ENCOUNTER — Encounter: Payer: Self-pay | Admitting: Internal Medicine

## 2014-03-01 ENCOUNTER — Other Ambulatory Visit: Payer: Self-pay | Admitting: Internal Medicine

## 2014-03-01 ENCOUNTER — Encounter: Payer: Self-pay | Admitting: Internal Medicine

## 2014-03-01 ENCOUNTER — Other Ambulatory Visit (INDEPENDENT_AMBULATORY_CARE_PROVIDER_SITE_OTHER): Payer: BC Managed Care – PPO

## 2014-03-01 ENCOUNTER — Ambulatory Visit (INDEPENDENT_AMBULATORY_CARE_PROVIDER_SITE_OTHER): Payer: BC Managed Care – PPO | Admitting: Internal Medicine

## 2014-03-01 VITALS — BP 132/90 | HR 75 | Temp 98.4°F | Ht 74.0 in | Wt 199.0 lb

## 2014-03-01 DIAGNOSIS — Z0000 Encounter for general adult medical examination without abnormal findings: Secondary | ICD-10-CM

## 2014-03-01 LAB — BASIC METABOLIC PANEL
BUN: 12 mg/dL (ref 6–23)
CALCIUM: 9 mg/dL (ref 8.4–10.5)
CO2: 30 mEq/L (ref 19–32)
Chloride: 101 mEq/L (ref 96–112)
Creatinine, Ser: 0.88 mg/dL (ref 0.40–1.50)
GFR: 92.25 mL/min (ref >60.00)
Glucose, Bld: 91 mg/dL (ref 70–99)
Potassium: 4.9 mEq/L (ref 3.5–5.1)
SODIUM: 135 meq/L (ref 135–145)

## 2014-03-01 LAB — URINALYSIS, ROUTINE W REFLEX MICROSCOPIC
Bilirubin Urine: NEGATIVE
Hgb urine dipstick: NEGATIVE
Ketones, ur: NEGATIVE
Leukocytes, UA: NEGATIVE
Nitrite: NEGATIVE
SPECIFIC GRAVITY, URINE: 1.01 (ref 1.000–1.030)
TOTAL PROTEIN, URINE-UPE24: NEGATIVE
URINE GLUCOSE: NEGATIVE
Urobilinogen, UA: 0.2 (ref 0.0–1.0)
pH: 7 (ref 5.0–8.0)

## 2014-03-01 LAB — CBC WITH DIFFERENTIAL/PLATELET
BASOS ABS: 0 10*3/uL (ref 0.0–0.1)
Basophils Relative: 0.5 % (ref 0.0–3.0)
Eosinophils Absolute: 0.6 10*3/uL (ref 0.0–0.7)
Eosinophils Relative: 9.9 % — ABNORMAL HIGH (ref 0.0–5.0)
HCT: 44.1 % (ref 39.0–52.0)
Hemoglobin: 15 g/dL (ref 13.0–17.0)
Lymphocytes Relative: 27.4 % (ref 12.0–46.0)
Lymphs Abs: 1.7 10*3/uL (ref 0.7–4.0)
MCHC: 34 g/dL (ref 30.0–36.0)
MCV: 88.9 fl (ref 78.0–100.0)
Monocytes Absolute: 0.6 10*3/uL (ref 0.1–1.0)
Monocytes Relative: 8.8 % (ref 3.0–12.0)
NEUTROS ABS: 3.4 10*3/uL (ref 1.4–7.7)
Neutrophils Relative %: 53.4 % (ref 43.0–77.0)
Platelets: 303 10*3/uL (ref 150.0–400.0)
RBC: 4.96 Mil/uL (ref 4.22–5.81)
RDW: 13.6 % (ref 11.5–15.5)
WBC: 6.4 10*3/uL (ref 4.0–10.5)

## 2014-03-01 LAB — HEPATIC FUNCTION PANEL
ALBUMIN: 4.2 g/dL (ref 3.5–5.2)
ALT: 22 U/L (ref 0–53)
AST: 19 U/L (ref 0–37)
Alkaline Phosphatase: 68 U/L (ref 39–117)
Bilirubin, Direct: 0.1 mg/dL (ref 0.0–0.3)
TOTAL PROTEIN: 7.1 g/dL (ref 6.0–8.3)
Total Bilirubin: 0.6 mg/dL (ref 0.2–1.2)

## 2014-03-01 LAB — LIPID PANEL
CHOL/HDL RATIO: 5
Cholesterol: 210 mg/dL — ABNORMAL HIGH (ref 0–200)
HDL: 40.3 mg/dL (ref >39.00)
LDL Cholesterol: 145 mg/dL — ABNORMAL HIGH (ref 0–99)
NonHDL: 169.7
Triglycerides: 125 mg/dL (ref 0.0–149.0)
VLDL: 25 mg/dL (ref 0.0–40.0)

## 2014-03-01 LAB — TSH: TSH: 1 u[IU]/mL (ref 0.35–4.50)

## 2014-03-01 LAB — PSA: PSA: 3.76 ng/mL (ref 0.10–4.00)

## 2014-03-01 MED ORDER — ATORVASTATIN CALCIUM 10 MG PO TABS: 10.0000 mg | ORAL_TABLET | Freq: Every day | ORAL | Status: DC

## 2014-03-01 NOTE — Assessment & Plan Note (Signed)

## 2014-03-01 NOTE — Patient Instructions (Addendum)
Please continue all other medications as before, and refills have been done if requested.  Please have the pharmacy call with any other refills you may need.  Please continue your efforts at being more active, low cholesterol diet, and weight control.  You are otherwise up to date with prevention measures today.  Please keep your appointments with your specialists as you may have planned  You will be contacted regarding the referral for: colonoscopy for an early Fri AM if possible after Mar 15  Please consider calling for a Nurse Visit for the Prevnar pneumonia shot  Please go to the LAB in the Basement (turn left off the elevator) for the tests to be done today  You will be contacted by phone if any changes need to be made immediately.  Otherwise, you will receive a letter about your results with an explanation, but please check with MyChart first.  Please remember to sign up for MyChart if you have not done so, as this will be important to you in the future with finding out test results, communicating by private email, and scheduling acute appointments online when needed.  Please return in 1 year for your yearly visit, or sooner if needed, with Lab testing done 3-5 days before

## 2014-03-01 NOTE — Progress Notes (Signed)
Pre visit review using our clinic review tool, if applicable. No additional management support is needed unless otherwise documented below in the visit note. 

## 2014-03-01 NOTE — Progress Notes (Signed)
Subjective:    Patient ID: Alexander Pierce, male    DOB: April 19, 1950, 64 y.o.   MRN: [id number]  HPI  Here for wellness and f/u;  Overall doing ok;  Pt denies CP, worsening SOB, DOE, wheezing, orthopnea, PND, worsening LE edema, palpitations, dizziness or syncope.  Pt denies neurological change such as new headache, facial or extremity weakness.  Pt denies polydipsia, polyuria, or low sugar symptoms. Pt states overall good compliance with treatment and medications, good tolerability, and has been trying to follow lower cholesterol diet.  Pt denies worsening depressive symptoms, suicidal ideation or panic. No fever, night sweats, wt loss, loss of appetite, or other constitutional symptoms.  Pt states good ability with ADL's, has low fall risk, home safety reviewed and adequate, no other significant changes in hearing or vision, and only occasionally active with exercise.   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 Still working full time for [employer], 60 hrs/wk.  No current complaints  Decliens immunizations  Past Medical History  Diagnosis Date  . HYPERLIPIDEMIA 06/29/2007    Qualifier: Diagnosis of  By: Jonny RuizJohn MD, Len BlalockJames W   . ALLERGIC RHINITIS 06/29/2007    Qualifier: Diagnosis of  By: Jonny RuizJohn MD, Len BlalockJames W   . Right inguinal hernia 06/24/2011   Past Surgical History  Procedure Laterality Date  . Sinus surgury  1992    dr Haroldine Lawscrossley    reports that he has never smoked. He has never used smokeless tobacco. He reports that he does not drink alcohol or use illicit drugs. family history includes Hypertension in an other family member; Parkinsonism in an other family member; Prostate cancer in an other family member. Allergies  Allergen Reactions  . Acetaminophen     REACTION: eyes swell  . Aspirin     REACTION: eyes swell  . Ibuprofen     REACTION: eyes swell  . Penicillins     REACTION: pt cant remember   No current outpatient prescriptions on file prior to visit.   No  current facility-administered medications on file prior to visit.   Review of Systems Constitutional: Negative for increased diaphoresis, other activity, appetite or other siginficant weight change  HENT: Negative for worsening hearing loss, ear pain, facial swelling, mouth sores and neck stiffness.   Eyes: Negative for other worsening pain, redness or visual disturbance.  Respiratory: Negative for shortness of breath and wheezing.   Cardiovascular: Negative for chest pain and palpitations.  Gastrointestinal: Negative for diarrhea, blood in stool, abdominal distention or other pain Genitourinary: Negative for hematuria, flank pain or change in urine volume.  Musculoskeletal: Negative for myalgias or other joint complaints.  Skin: Negative for color change and wound.  Neurological: Negative for syncope and numbness. other than noted Hematological: Negative for adenopathy. or other swelling Psychiatric/Behavioral: Negative for hallucinations, self-injury, decreased concentration or other worsening agitation.      Objective:   Physical Exam BP 132/90 mmHg  Pulse 75  Temp(Src) 98.4 F (36.9 C) (Oral)  Ht 6\' 2"  (1.88 m)  Wt 199 lb (90.266 kg)  BMI 25.54 kg/m2 VS noted,  Constitutional: Pt is oriented to person, place, and time. Appears well-developed and well-nourished.  Head: Normocephalic and atraumatic.  Right Ear: External ear normal.  Left Ear: External ear normal.  Nose: Nose normal.  Mouth/Throat: Oropharynx is clear and moist.  Eyes: Conjunctivae and EOM are normal. Pupils are equal, round, and reactive to light.  Neck: Normal range of motion. Neck supple. No JVD  present. No tracheal deviation present.  Cardiovascular: Normal rate, regular rhythm, normal heart sounds and intact distal pulses.   Pulmonary/Chest: Effort normal and breath sounds without rales or wheezing  Abdominal: Soft. Bowel sounds are normal. NT. No HSM  Musculoskeletal: Normal range of motion. Exhibits no  edema.  Lymphadenopathy:  Has no cervical adenopathy.  Neurological: Pt is alert and oriented to person, place, and time. Pt has normal reflexes. No cranial nerve deficit. Motor grossly intact Skin: Skin is warm and dry. No rash noted.  Psychiatric:  Has normal mood and affect. Behavior is normal.     Assessment & Plan:

## 2014-03-25 ENCOUNTER — Encounter: Payer: Self-pay | Admitting: Internal Medicine

## 2014-03-29 ENCOUNTER — Telehealth: Payer: Self-pay

## 2014-04-01 NOTE — Telephone Encounter (Signed)
LVM for the patient to call the practice to confirm date of flu vaccination or to call the office for an apt to take a flu shot. 

## 2014-05-24 ENCOUNTER — Ambulatory Visit (AMBULATORY_SURGERY_CENTER): Payer: Self-pay | Admitting: *Deleted

## 2014-05-24 ENCOUNTER — Encounter: Payer: Self-pay | Admitting: Internal Medicine

## 2014-05-24 VITALS — Ht 74.0 in | Wt 202.4 lb

## 2014-05-24 DIAGNOSIS — Z1211 Encounter for screening for malignant neoplasm of colon: Secondary | ICD-10-CM

## 2014-05-24 NOTE — Progress Notes (Signed)
No soy egg allergy No issues with past sedation No home 02 No diet pills emmi declined

## 2014-06-07 ENCOUNTER — Encounter: Payer: BC Managed Care – PPO | Admitting: Internal Medicine

## 2014-06-19 ENCOUNTER — Encounter: Payer: BC Managed Care – PPO | Admitting: Internal Medicine

## 2014-09-20 ENCOUNTER — Encounter: Payer: BC Managed Care – PPO | Admitting: Internal Medicine

## 2014-12-20 ENCOUNTER — Ambulatory Visit: Payer: BC Managed Care – PPO | Admitting: Internal Medicine

## 2014-12-20 DIAGNOSIS — Z0289 Encounter for other administrative examinations: Secondary | ICD-10-CM

## 2014-12-25 ENCOUNTER — Other Ambulatory Visit: Payer: Self-pay | Admitting: Internal Medicine

## 2014-12-25 ENCOUNTER — Other Ambulatory Visit (INDEPENDENT_AMBULATORY_CARE_PROVIDER_SITE_OTHER): Payer: BC Managed Care – PPO

## 2014-12-25 ENCOUNTER — Ambulatory Visit (INDEPENDENT_AMBULATORY_CARE_PROVIDER_SITE_OTHER): Payer: BC Managed Care – PPO | Admitting: Internal Medicine

## 2014-12-25 ENCOUNTER — Encounter: Payer: Self-pay | Admitting: Internal Medicine

## 2014-12-25 VITALS — BP 124/86 | HR 67 | Temp 97.7°F | Ht 74.0 in | Wt 201.0 lb

## 2014-12-25 DIAGNOSIS — Z0189 Encounter for other specified special examinations: Secondary | ICD-10-CM

## 2014-12-25 DIAGNOSIS — R972 Elevated prostate specific antigen [PSA]: Secondary | ICD-10-CM | POA: Diagnosis not present

## 2014-12-25 DIAGNOSIS — R198 Other specified symptoms and signs involving the digestive system and abdomen: Secondary | ICD-10-CM

## 2014-12-25 DIAGNOSIS — E785 Hyperlipidemia, unspecified: Secondary | ICD-10-CM

## 2014-12-25 DIAGNOSIS — Z0000 Encounter for general adult medical examination without abnormal findings: Secondary | ICD-10-CM

## 2014-12-25 DIAGNOSIS — K6289 Other specified diseases of anus and rectum: Secondary | ICD-10-CM | POA: Insufficient documentation

## 2014-12-25 LAB — PSA: PSA: 4.17 ng/mL — ABNORMAL HIGH (ref 0.10–4.00)

## 2014-12-25 NOTE — Progress Notes (Signed)
Pre visit review using our clinic review tool, if applicable. No additional management support is needed unless otherwise documented below in the visit note. 

## 2014-12-25 NOTE — Assessment & Plan Note (Signed)
Most recent psa with ? Increased velocity increase over baseline, for f/u psa today, to urology if > 4

## 2014-12-25 NOTE — Assessment & Plan Note (Signed)
C/w nonthrombosed hemorrhoid most likely vs cyst, ok to cont to follow

## 2014-12-25 NOTE — Assessment & Plan Note (Signed)
Has not taken statin, wanted to cont to try diet, for cont'd diet efforts, f/u with labs next visit Lab Results  Component Value Date   LDLCALC 145* 03/01/2014

## 2014-12-25 NOTE — Addendum Note (Signed)
Addended by: Corwin LevinsJOHN, JAMES W on: 12/25/2014 12:37 PM   Modules accepted: Orders

## 2014-12-25 NOTE — Progress Notes (Signed)
Subjective:    Patient ID: Alexander Pierce, male    DOB: Sep 24, 1950, 64 y.o.   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  Pt denies new neurological symptoms such as new headache, or facial or extremity weakness or numbness.   Pt states overall good compliance with meds, mostly trying to follow appropriate diet, with wt overall stable,  but little exercise however, plans to start doing more now that the campaign season is done.  Denies urinary symptoms such as dysuria, frequency, urgency, flank pain, hematuria or n/v, fever, chills.  Hands and feet can seem cold at times with shaking hands.  Also has 10 day hx of painless perianal swelling mass without fever, draiange or prior hx. Past Medical History  Diagnosis Date  . HYPERLIPIDEMIA 06/29/2007    Qualifier: Diagnosis of  By: Jonny RuizJohn MD, Len BlalockJames W   . ALLERGIC RHINITIS 06/29/2007    Qualifier: Diagnosis of  By: Jonny RuizJohn MD, Len BlalockJames W   . Right inguinal hernia 06/24/2011  . Allergy     seasonal   Past Surgical History  Procedure Laterality Date  . Sinus surgury  1992    dr Haroldine Lawscrossley  . Inguinal hernia repair      reports that he has never smoked. He has never used smokeless tobacco. He reports that he does not drink alcohol or use illicit drugs. family history includes Hypertension in an other family member; Parkinsonism in an other family member; Prostate cancer in an other family member. There is no history of Colon cancer, Rectal cancer, or Stomach cancer. Allergies  Allergen Reactions  . Acetaminophen     REACTION: eyes swell  . Aspirin     REACTION: eyes swell  . Ibuprofen     REACTION: eyes swell  . Penicillins     REACTION: pt cant remember   No current outpatient prescriptions on file prior to visit.   No current  facility-administered medications on file prior to visit.   Review of Systems  Constitutional: Negative for unusual diaphoresis or night sweats HENT: Negative for ringing in ear or discharge Eyes: Negative for double vision or worsening visual disturbance.  Respiratory: Negative for choking and stridor.   Gastrointestinal: Negative for vomiting or other signifcant bowel change Genitourinary: Negative for hematuria or change in urine volume.  Musculoskeletal: Negative for other MSK pain or swelling Skin: Negative for color change and worsening wound.  Neurological: Negative for tremors and numbness other than noted  Psychiatric/Behavioral: Negative for decreased concentration or agitation other than above       Objective:   Physical Exam BP 124/86 mmHg  Pulse 67  Temp(Src) 97.7 F (36.5 C) (Oral)  Ht 6\' 2"  (1.88 m)  Wt 201 lb (91.173 kg)  BMI 25.80 kg/m2  SpO2 98% VS noted,  Constitutional: Pt appears in no significant distress HENT: Head: NCAT.  Right Ear: External ear normal.  Left Ear: External ear normal.  Eyes: . Pupils are equal, round, and reactive to light. Conjunctivae and EOM are normal Neck: Normal range of motion. Neck supple.  Cardiovascular: Normal rate and regular rhythm.   Pulmonary/Chest: Effort normal and breath sounds without rales or wheezing.  Abd:  Soft, NT, ND, + BS Neurological: Pt is alert. Not confused , motor grossly intact, no cold  extremities noted today Skin: Skin is warm. No rash, no LE edema Psychiatric: Pt behavior is normal. No agitation.  Rectal exam: dre not done, but ext exam with .5 cm slight raised pink domed soft compressible mass noted, nontender, no erythema or drainage Lab Results  Component Value Date   WBC 6.4 03/01/2014   HGB 15.0 03/01/2014   HCT 44.1 03/01/2014   PLT 303.0 03/01/2014   GLUCOSE 91 03/01/2014   CHOL 210* 03/01/2014   TRIG 125.0 03/01/2014   HDL 40.30 03/01/2014   LDLCALC 145* 03/01/2014   ALT 22 03/01/2014    AST 19 03/01/2014   NA 135 03/01/2014   K 4.9 03/01/2014   CL 101 03/01/2014   CREATININE 0.88 03/01/2014   BUN 12 03/01/2014   CO2 30 03/01/2014   TSH 1.00 03/01/2014   PSA 3.76 03/01/2014       Assessment & Plan:

## 2014-12-25 NOTE — Patient Instructions (Signed)
Please continue all other medications as before, and refills have been done if requested.  Please have the pharmacy call with any other refills you may need.  Please continue your efforts at being more active, low cholesterol diet, and weight control.  You are otherwise up to date with prevention measures today.  Please keep your appointments with your specialists as you may have planned  Please go to the LAB in the Basement (turn left off the elevator) for the tests to be done today - just the PSA today  You will be contacted by phone if any changes need to be made immediately.  Otherwise, you will receive a letter about your results with an explanation, but please check with MyChart first.  You will be contacted regarding the referral for: colonoscopy (they should call you)  Please remember to sign up for MyChart if you have not done so, as this will be important to you in the future with finding out test results, communicating by private email, and scheduling acute appointments online when needed.  Please return in 3 months, or sooner if needed, with Lab testing done 3-5 days before

## 2015-03-18 ENCOUNTER — Ambulatory Visit: Payer: BC Managed Care – PPO | Admitting: Family Medicine

## 2015-03-26 ENCOUNTER — Other Ambulatory Visit (INDEPENDENT_AMBULATORY_CARE_PROVIDER_SITE_OTHER): Payer: BC Managed Care – PPO

## 2015-03-26 ENCOUNTER — Ambulatory Visit (INDEPENDENT_AMBULATORY_CARE_PROVIDER_SITE_OTHER): Payer: BC Managed Care – PPO | Admitting: Family Medicine

## 2015-03-26 ENCOUNTER — Ambulatory Visit (INDEPENDENT_AMBULATORY_CARE_PROVIDER_SITE_OTHER): Payer: BC Managed Care – PPO | Admitting: Internal Medicine

## 2015-03-26 ENCOUNTER — Encounter: Payer: Self-pay | Admitting: Family Medicine

## 2015-03-26 ENCOUNTER — Encounter: Payer: Self-pay | Admitting: Internal Medicine

## 2015-03-26 ENCOUNTER — Other Ambulatory Visit: Payer: Self-pay | Admitting: Internal Medicine

## 2015-03-26 VITALS — BP 118/78 | HR 64 | Temp 98.4°F | Resp 20 | Wt 198.0 lb

## 2015-03-26 VITALS — BP 118/78 | HR 64 | Ht 74.0 in | Wt 198.0 lb

## 2015-03-26 DIAGNOSIS — L989 Disorder of the skin and subcutaneous tissue, unspecified: Secondary | ICD-10-CM

## 2015-03-26 DIAGNOSIS — R972 Elevated prostate specific antigen [PSA]: Secondary | ICD-10-CM

## 2015-03-26 DIAGNOSIS — E785 Hyperlipidemia, unspecified: Secondary | ICD-10-CM

## 2015-03-26 DIAGNOSIS — M25562 Pain in left knee: Secondary | ICD-10-CM | POA: Diagnosis not present

## 2015-03-26 DIAGNOSIS — M1710 Unilateral primary osteoarthritis, unspecified knee: Secondary | ICD-10-CM

## 2015-03-26 DIAGNOSIS — Z0000 Encounter for general adult medical examination without abnormal findings: Secondary | ICD-10-CM | POA: Diagnosis not present

## 2015-03-26 DIAGNOSIS — M25561 Pain in right knee: Secondary | ICD-10-CM

## 2015-03-26 DIAGNOSIS — M170 Bilateral primary osteoarthritis of knee: Secondary | ICD-10-CM

## 2015-03-26 LAB — LIPID PANEL
Cholesterol: 208 mg/dL — ABNORMAL HIGH (ref 0–200)
HDL: 43.7 mg/dL (ref >39.00)
LDL CALC: 141 mg/dL — AB (ref 0–99)
NONHDL: 164.22
Total CHOL/HDL Ratio: 5
Triglycerides: 118 mg/dL (ref 0.0–149.0)
VLDL: 23.6 mg/dL (ref 0.0–40.0)

## 2015-03-26 LAB — URINALYSIS, ROUTINE W REFLEX MICROSCOPIC
Bilirubin Urine: NEGATIVE
HGB URINE DIPSTICK: NEGATIVE
Ketones, ur: NEGATIVE
LEUKOCYTES UA: NEGATIVE
NITRITE: NEGATIVE
RBC / HPF: NONE SEEN (ref 0–?)
Specific Gravity, Urine: 1.01 (ref 1.000–1.030)
Total Protein, Urine: NEGATIVE
Urine Glucose: NEGATIVE
Urobilinogen, UA: 0.2 (ref 0.0–1.0)
WBC UA: NONE SEEN (ref 0–?)
pH: 7 (ref 5.0–8.0)

## 2015-03-26 LAB — CBC WITH DIFFERENTIAL/PLATELET
Basophils Absolute: 0 10*3/uL (ref 0.0–0.1)
Basophils Relative: 0.7 % (ref 0.0–3.0)
EOS PCT: 10.8 % — AB (ref 0.0–5.0)
Eosinophils Absolute: 0.8 10*3/uL — ABNORMAL HIGH (ref 0.0–0.7)
HEMATOCRIT: 43.9 % (ref 39.0–52.0)
HEMOGLOBIN: 14.9 g/dL (ref 13.0–17.0)
LYMPHS ABS: 2.1 10*3/uL (ref 0.7–4.0)
Lymphocytes Relative: 28.6 % (ref 12.0–46.0)
MCHC: 33.9 g/dL (ref 30.0–36.0)
MCV: 88.4 fl (ref 78.0–100.0)
MONOS PCT: 9.9 % (ref 3.0–12.0)
Monocytes Absolute: 0.7 10*3/uL (ref 0.1–1.0)
NEUTROS PCT: 50 % (ref 43.0–77.0)
Neutro Abs: 3.6 10*3/uL (ref 1.4–7.7)
Platelets: 288 10*3/uL (ref 150.0–400.0)
RBC: 4.97 Mil/uL (ref 4.22–5.81)
RDW: 13.7 % (ref 11.5–15.5)
WBC: 7.2 10*3/uL (ref 4.0–10.5)

## 2015-03-26 LAB — HEPATIC FUNCTION PANEL
ALBUMIN: 4.4 g/dL (ref 3.5–5.2)
ALK PHOS: 72 U/L (ref 39–117)
ALT: 23 U/L (ref 0–53)
AST: 20 U/L (ref 0–37)
BILIRUBIN DIRECT: 0.1 mg/dL (ref 0.0–0.3)
Total Bilirubin: 0.6 mg/dL (ref 0.2–1.2)
Total Protein: 7.1 g/dL (ref 6.0–8.3)

## 2015-03-26 LAB — BASIC METABOLIC PANEL
BUN: 11 mg/dL (ref 6–23)
CALCIUM: 9.4 mg/dL (ref 8.4–10.5)
CO2: 30 mEq/L (ref 19–32)
Chloride: 99 mEq/L (ref 96–112)
Creatinine, Ser: 0.93 mg/dL (ref 0.40–1.50)
GFR: 86.27 mL/min (ref >60.00)
GLUCOSE: 96 mg/dL (ref 70–99)
Potassium: 5.1 mEq/L (ref 3.5–5.1)
SODIUM: 135 meq/L (ref 135–145)

## 2015-03-26 LAB — PSA: PSA: 3.98 ng/mL (ref 0.10–4.00)

## 2015-03-26 LAB — TSH: TSH: 0.77 u[IU]/mL (ref 0.35–4.50)

## 2015-03-26 MED ORDER — ATORVASTATIN CALCIUM 10 MG PO TABS
10.0000 mg | ORAL_TABLET | Freq: Every day | ORAL | Status: DC
Start: 2015-03-26 — End: 2016-04-21

## 2015-03-26 MED ORDER — DICLOFENAC SODIUM 2 % TD SOLN: 2.0000 "application " | Freq: Two times a day (BID) | TRANSDERMAL | Status: DC

## 2015-03-26 NOTE — Assessment & Plan Note (Signed)
To cont low chol diet, goal < 100, d/w pt, for lipid recheck , consider statin start

## 2015-03-26 NOTE — Progress Notes (Signed)
Pre visit review using our clinic review tool, if applicable. No additional management support is needed unless otherwise documented below in the visit note. 

## 2015-03-26 NOTE — Assessment & Plan Note (Signed)
For Dermatology referral, has several lesions  And hx of 2 precancerous lesions off about 2 yrs ago

## 2015-03-26 NOTE — Progress Notes (Signed)
I'm seeing this patient by the request  of:  [name], MD   CC: Right knee pain  HPI: Patient is a very nice 65 year old gentleman who is coming in with right knee pain.  Patient was seen greater than 2 years ago and was found to have a knee effusion. Patient did have an injection and aspiration done at that time. Patient was to do different over-the-counter medications. States that he was doing significantly better for quite some time. Started having worsening symptoms again recently. Does not remember nature injury. States that it's more on the medial aspect of the knees. Seems worse with a lot of walking or going up or downstairs. Denies any significant pain at rest. Denies any instability. Patient is wondering known if he needs to do anything more aggressive.  Past Medical History  Diagnosis Date  . HYPERLIPIDEMIA 06/29/2007    Qualifier: Diagnosis of  By: Jonny Ruiz MD, Len Blalock   . ALLERGIC RHINITIS 06/29/2007    Qualifier: Diagnosis of  By: Jonny Ruiz MD, Len Blalock   . Right inguinal hernia 06/24/2011  . Allergy     seasonal   Past Surgical History  Procedure Laterality Date  . Sinus surgury  1992    dr Haroldine Laws  . Inguinal hernia repair     Social History  Substance Use Topics  . Smoking status: Never Smoker   . Smokeless tobacco: Never Used  . Alcohol Use: No   Allergies  Allergen Reactions  . Acetaminophen     REACTION: eyes swell  . Aspirin     REACTION: eyes swell  . Ibuprofen     REACTION: eyes swell  . Penicillins     REACTION: pt cant remember   Family History  Problem Relation Age of Onset  . Parkinsonism    . Prostate cancer    . Hypertension    . Colon cancer Neg Hx   . Rectal cancer Neg Hx   . Stomach cancer Neg Hx       Past medical, surgical, family and social history reviewed. Medications reviewed all in the electronic medical record.   Review of Systems: No headache, visual changes, nausea, vomiting, diarrhea, constipation, dizziness, abdominal pain,  skin rash, fevers, chills, night sweats, weight loss, swollen lymph nodes, body aches, joint swelling, muscle aches, chest pain, shortness of breath, mood changes.   Objective:    Blood pressure 118/78, pulse 64, height  (1.88 m), weight 198 lb (89.812 kg), SpO2 97 %.   General: No apparent distress alert and oriented x3 mood and affect normal, dressed appropriately.  HEENT: Pupils equal, extraocular movements intact Respiratory: Patient's speak in full sentences and does not appear short of breath Cardiovascular: No lower extremity edema, non tender, no erythema Skin: Warm dry intact with no signs of infection or rash on extremities or on axial skeleton. Abdomen: Soft nontender Neuro: Cranial nerves II through XII are intact, neurovascularly intact in all extremities with 2+ DTRs and 2+ pulses. Lymph: No lymphadenopathy of posterior or anterior cervical chain or axillae bilaterally.  Gait normal with good balance and coordination.  MSK: Non tender with full range of motion and good stability and symmetric strength and tone of shoulders, elbows, wrist, hip, and ankles bilaterally.  Knee: Right Normal to inspection with no erythema or effusion or obvious bony abnormalities. Mild osteoarthritic changes Palpation reveals patient does have medial and lateral joint line tenderness of the right knee more on the lateral aspect. ROM full in flexion and extension and  lower leg rotation. Ligaments with solid consistent endpoints including ACL, PCL, LCL, MCL. Negative Mcmurray's, Apley's, and Thessalonian tests.  painful patellar compression. Patellar glide with mild crepitus. Patellar and quadriceps tendons unremarkable. Hamstring and quadriceps strength is normal.  Contralateral knee Mild tenderness over the medial joint line as well.  MSK US performed of: Right knee This study was ordered, performed, and interpreted by Terrilee FilesZach Karizma Cheek D.O.  Knee: All structures visualized. Anteromedial,  anterolateral, posteromedial, and posterolateral menisci unremarkable without tearing, fraying, effusion, or displacement. Tearing of the medial joint line bilaterally record of left Medial and lateral joint lines do show osteoarthritic changes. Patient patellofemoral joint also has arthritis Patellar Tendon unremarkable on long and transverse views without effusion. No abnormality of prepatellar bursa. LCL and MCL unremarkable on long and transverse views. No abnormality of origin of medial or lateral head of the gastrocnemius.  IMPRESSION:  Osteoarthritic knee    Impression and Recommendations:     This case required medical decision making of moderate complexity.

## 2015-03-26 NOTE — Assessment & Plan Note (Signed)

## 2015-03-26 NOTE — Assessment & Plan Note (Signed)
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 Patient though will likely will do well. Not stopping him from any activities. Patient given a prescription for topical anti-inflammatory's. We discussed home exercises and over-the-counter medications. We discussed icing pedicle. Patient will remain active. Discussed proper shoes. Patient come back and see me again in 4 weeks. If worsening symptoms we'll consider repeat x-rays as well as possible injections.

## 2015-03-26 NOTE — Assessment & Plan Note (Signed)
For f/u PSA per pt request, but still to f/u in April with urology for same, I believe upward trend makes chance of prostate ca more likely in his case - d/w pt

## 2015-03-26 NOTE — Patient Instructions (Addendum)
Your EKG was OK today  Please continue all other medications as before, and refills have been done if requested.  Please have the pharmacy call with any other refills you may need.  Please continue your efforts at being more active, low cholesterol diet, and weight control.  You are otherwise up to date with prevention measures today.  You will be contacted regarding the referral for: cologuard (you should be contacted by mail)  You will be contacted regarding the referral for: Dermatology  Please keep your appointments with your specialists as you may have planned- urology in April,. And Dr Katrinka BlazingSmith later today  Please go to the LAB in the Basement (turn left off the elevator) for the tests to be done today  You will be contacted by phone if any changes need to be made immediately.  Otherwise, you will receive a letter about your results with an explanation, but please check with MyChart first.  Please remember to sign up for MyChart if you have not done so, as this will be important to you in the future with finding out test results, communicating by private email, and scheduling acute appointments online when needed.  Please return in 1 year for your yearly visit, or sooner if needed, with Lab testing done 3-5 days before

## 2015-03-26 NOTE — Progress Notes (Signed)
Subjective:    Patient ID: Alexander Pierce, male    DOB: February 27, 1950, 65 y.o.   MRN: [id number]  HPI  Here for wellness and f/u;  Overall doing ok;  Pt denies Chest pain, worsening SOB, DOE, wheezing, orthopnea, PND, worsening LE edema, palpitations, dizziness or syncope.  Pt denies neurological change such as new headache, facial or extremity weakness.  Pt denies polydipsia, polyuria, or low sugar symptoms. Pt states overall good compliance with treatment and medications, good tolerability, and has been trying to follow appropriate diet.  Pt denies worsening depressive symptoms, suicidal ideation or panic. No fever, night sweats, wt loss, loss of appetite, or other constitutional symptoms.  Pt states good ability with ADL's, has low fall risk, home safety reviewed and adequate, no other significant changes in hearing or vision, and only occasionally active with exercise.  Did see urology jan 2017, with plan for psa then and consider prostate biopsy need. Pt asks for f/u today as well.  Also has appt later this Am with Dr Smith/sport med for bilat knee DJD.   Not taking statin, wanted to work on lower chol diet first after rx last yr.  Non ASA candidiate due to allergy.  No current complaints Past Medical History  Diagnosis Date  . HYPERLIPIDEMIA 06/29/2007    Qualifier: Diagnosis of  By: Jonny RuizJohn MD, Len BlalockJames W   . ALLERGIC RHINITIS 06/29/2007    Qualifier: Diagnosis of  By: Jonny RuizJohn MD, Len BlalockJames W   . Right inguinal hernia 06/24/2011  . Allergy     seasonal   Past Surgical History  Procedure Laterality Date  . Sinus surgury  1992    dr Haroldine Lawscrossley  . Inguinal hernia repair      reports that he has never smoked. He has never used smokeless tobacco. He reports that he does not drink alcohol or use illicit drugs. family history is negative for Colon cancer, Rectal cancer, and Stomach cancer. Allergies  Allergen Reactions  . Acetaminophen     REACTION: eyes swell  . Aspirin     REACTION: eyes swell  .  Ibuprofen     REACTION: eyes swell  . Penicillins     REACTION: pt cant remember   Current Outpatient Prescriptions on File Prior to Visit  Medication Sig Dispense Refill  . Multiple Vitamin (MULTIVITAMIN) tablet Take 1 tablet by mouth daily.     No current facility-administered medications on file prior to visit.   Review of Systems Constitutional: Negative for increased diaphoresis, other activity, appetite or siginficant weight change other than noted HENT: Negative for worsening hearing loss, ear pain, facial swelling, mouth sores and neck stiffness.   Eyes: Negative for other worsening pain, redness or visual disturbance.  Respiratory: Negative for shortness of breath and wheezing  Cardiovascular: Negative for chest pain and palpitations.  Gastrointestinal: Negative for diarrhea, blood in stool, abdominal distention or other pain Genitourinary: Negative for hematuria, flank pain or change in urine volume.  Musculoskeletal: Negative for myalgias or other joint complaints.  Skin: Negative for color change and wound or drainage.  Neurological: Negative for syncope and numbness. other than noted Hematological: Negative for adenopathy. or other swelling Psychiatric/Behavioral: Negative for hallucinations, SI, self-injury, decreased concentration or other worsening agitation.      Objective:   Physical Exam BP 118/78 mmHg  Pulse 64  Temp(Src) 98.4 F (36.9 C) (Oral)  Resp 20  Wt 198 lb (89.812 kg)  SpO2 97% VS noted,  Constitutional: Pt is oriented to person, place,  and time. Appears well-developed and well-nourished, in no significant distress Head: Normocephalic and atraumatic.  Right Ear: External ear normal.  Left Ear: External ear normal.  Nose: Nose normal.  Mouth/Throat: Oropharynx is clear and moist.  Eyes: Conjunctivae and EOM are normal. Pupils are equal, round, and reactive to light.  Neck: Normal range of motion. Neck supple. No JVD present. No tracheal deviation  present or significant neck LA or mass Cardiovascular: Normal rate, regular rhythm, normal heart sounds and intact distal pulses.   Pulmonary/Chest: Effort normal and breath sounds without rales or wheezing  Abdominal: Soft. Bowel sounds are normal. NT. No HSM  Musculoskeletal: Normal range of motion. Exhibits no edema.  Lymphadenopathy:  Has no cervical adenopathy.  Neurological: Pt is alert and oriented to person, place, and time. Pt has normal reflexes. No cranial nerve deficit. Motor grossly intact Skin: Skin is warm and dry. No rash noted.  Psychiatric:  Has normal mood and affect. Behavior is normal.     Assessment & Plan:

## 2015-03-26 NOTE — Patient Instructions (Addendum)
Good to se eyou  Ice 20 minutes 2 times daily. Usually after activity and before bed. Stay active [employer] orthotics "total support" online would be great  pennsaid pinkie amount topically 2 times daily as needed.  Turmeric 500mg  twice daily  Vitamin D 2000 IU daily  See me again in 4 weeks and look forward to meeting your wife.

## 2015-03-26 NOTE — Assessment & Plan Note (Signed)
Patient does have knee arthritis bilaterally. Encourage him to start doing the over-the-counter medications a seem to help previously. Patient even a prescription for topical anti-inflammatory's. Home exercise given. We discussed if any worsening symptoms injections could be a good option as well as possible repeat imaging. Patient will come back and see me again in 3-4 weeks for further evaluation.

## 2015-03-27 LAB — HEPATITIS C ANTIBODY: HCV Ab: NEGATIVE

## 2015-04-23 ENCOUNTER — Ambulatory Visit: Payer: BC Managed Care – PPO | Admitting: Family Medicine

## 2015-05-19 ENCOUNTER — Ambulatory Visit (INDEPENDENT_AMBULATORY_CARE_PROVIDER_SITE_OTHER): Payer: BC Managed Care – PPO | Admitting: Family Medicine

## 2015-05-19 ENCOUNTER — Encounter: Payer: Self-pay | Admitting: Family Medicine

## 2015-05-19 VITALS — BP 134/80 | HR 79 | Ht 74.0 in | Wt 200.0 lb

## 2015-05-19 DIAGNOSIS — M1710 Unilateral primary osteoarthritis, unspecified knee: Secondary | ICD-10-CM

## 2015-05-19 NOTE — Progress Notes (Signed)
Pre visit review using our clinic review tool, if applicable. No additional management support is needed unless otherwise documented below in the visit note. 

## 2015-05-19 NOTE — Progress Notes (Signed)
I'm seeing this patient by the request  of:  Oliver BarreJames Macky, MD   CC: Right knee pain follow-up  HPI: Patient is a very nice 65 year old gentleman who is coming in with right knee pain. Patient does have arthritic changes of the knees bilaterally. Elected try conservative therapy with topical, icing, and over-the-counter medications. States that he is feeling approximately 70% better. No pain that is stopping him from activities. No swelling. Unable to do all activities and no trouble with sleeping. No giving out on him or locking.  Past Medical History  Diagnosis Date  . HYPERLIPIDEMIA 06/29/2007    Qualifier: Diagnosis of  By: Jonny RuizJohn MD, Len BlalockJames W   . ALLERGIC RHINITIS 06/29/2007    Qualifier: Diagnosis of  By: Jonny RuizJohn MD, Len BlalockJames W   . Right inguinal hernia 06/24/2011  . Allergy     seasonal   Past Surgical History  Procedure Laterality Date  . Sinus surgury  1992    dr Haroldine Lawscrossley  . Inguinal hernia repair     Social History  Substance Use Topics  . Smoking status: Never Smoker   . Smokeless tobacco: Never Used  . Alcohol Use: No   Allergies  Allergen Reactions  . Acetaminophen     REACTION: eyes swell  . Aspirin     REACTION: eyes swell  . Ibuprofen     REACTION: eyes swell  . Penicillins     REACTION: pt cant remember   Family History  Problem Relation Age of Onset  . Parkinsonism    . Prostate cancer    . Hypertension    . Colon cancer Neg Hx   . Rectal cancer Neg Hx   . Stomach cancer Neg Hx       Past medical, surgical, family and social history reviewed. Medications reviewed all in the electronic medical record.   Review of Systems: No headache, visual changes, nausea, vomiting, diarrhea, constipation, dizziness, abdominal pain, skin rash, fevers, chills, night sweats, weight loss, swollen lymph nodes, body aches, joint swelling, muscle aches, chest pain, shortness of breath, mood changes.   Objective:    Blood pressure 134/80, pulse 79, height 6\' 2"  (1.88 m), weight  200 lb (90.719 kg), SpO2 96 %.   General: No apparent distress alert and oriented x3 mood and affect normal, dressed appropriately.  HEENT: Pupils equal, extraocular movements intact Respiratory: Patient's speak in full sentences and does not appear short of breath Cardiovascular: No lower extremity edema, non tender, no erythema Skin: Warm dry intact with no signs of infection or rash on extremities or on axial skeleton. Abdomen: Soft nontender Neuro: Cranial nerves II through XII are intact, neurovascularly intact in all extremities with 2+ DTRs and 2+ pulses. Lymph: No lymphadenopathy of posterior or anterior cervical chain or axillae bilaterally.  Gait normal with good balance and coordination.  MSK: Non tender with full range of motion and good stability and symmetric strength and tone of shoulders, elbows, wrist, hip, and ankles bilaterally.  Knee: Right Normal to inspection with no erythema or effusion or obvious bony abnormalities. Mild osteoarthritic changes Mild medial joint tendernessct. ROM full in flexion and extension and lower leg rotation. Ligaments with solid consistent endpoints including ACL, PCL, LCL, MCL.  Negative Mcmurray's, Apley's, and Thessalonian tests.  painful patellar compression. Patellar glide with mild crepitus. Patellar and quadriceps tendons unremarkable. Hamstring and quadriceps strength is normal.  /lateral knee unremarkable   Impression and Recommendations:     This case required medical decision making of moderate complexity.

## 2015-05-19 NOTE — Patient Instructions (Signed)
Good to see you  Ice is your friend  Keep doing what you are doing  See me again when you  Need me

## 2015-05-19 NOTE — Assessment & Plan Note (Signed)
Patient is doing very well at this time. Patient encouraged to continue with the conservative therapy. We'll see me on an as-needed basis. Worsening symptoms formal physical therapy, x-rays and injections would be warranted.

## 2016-03-26 ENCOUNTER — Encounter: Payer: Self-pay | Admitting: Internal Medicine

## 2016-03-26 LAB — COLOGUARD

## 2016-04-21 ENCOUNTER — Encounter: Payer: Self-pay | Admitting: Internal Medicine

## 2016-04-21 ENCOUNTER — Other Ambulatory Visit (INDEPENDENT_AMBULATORY_CARE_PROVIDER_SITE_OTHER): Payer: BC Managed Care – PPO

## 2016-04-21 ENCOUNTER — Ambulatory Visit (INDEPENDENT_AMBULATORY_CARE_PROVIDER_SITE_OTHER): Payer: BC Managed Care – PPO | Admitting: Internal Medicine

## 2016-04-21 VITALS — BP 138/88 | HR 68 | Temp 97.6°F | Ht 74.0 in | Wt 202.0 lb

## 2016-04-21 DIAGNOSIS — Z0000 Encounter for general adult medical examination without abnormal findings: Secondary | ICD-10-CM

## 2016-04-21 LAB — LIPID PANEL
CHOL/HDL RATIO: 5
Cholesterol: 217 mg/dL — ABNORMAL HIGH (ref 0–200)
HDL: 42.5 mg/dL (ref >39.00)
LDL Cholesterol: 148 mg/dL — ABNORMAL HIGH (ref 0–99)
NONHDL: 174.71
Triglycerides: 134 mg/dL (ref 0.0–149.0)
VLDL: 26.8 mg/dL (ref 0.0–40.0)

## 2016-04-21 LAB — HEPATIC FUNCTION PANEL
ALBUMIN: 4.3 g/dL (ref 3.5–5.2)
ALT: 21 U/L (ref 0–53)
AST: 19 U/L (ref 0–37)
Alkaline Phosphatase: 67 U/L (ref 39–117)
Bilirubin, Direct: 0.1 mg/dL (ref 0.0–0.3)
TOTAL PROTEIN: 7.2 g/dL (ref 6.0–8.3)
Total Bilirubin: 0.5 mg/dL (ref 0.2–1.2)

## 2016-04-21 LAB — BASIC METABOLIC PANEL
BUN: 12 mg/dL (ref 6–23)
CALCIUM: 9.2 mg/dL (ref 8.4–10.5)
CHLORIDE: 100 meq/L (ref 96–112)
CO2: 30 mEq/L (ref 19–32)
CREATININE: 0.95 mg/dL (ref 0.40–1.50)
GFR: 83.91 mL/min (ref >60.00)
Glucose, Bld: 97 mg/dL (ref 70–99)
Potassium: 5.2 mEq/L — ABNORMAL HIGH (ref 3.5–5.1)
Sodium: 133 mEq/L — ABNORMAL LOW (ref 135–145)

## 2016-04-21 LAB — CBC WITH DIFFERENTIAL/PLATELET
BASOS ABS: 0 10*3/uL (ref 0.0–0.1)
Basophils Relative: 0.6 % (ref 0.0–3.0)
EOS ABS: 0.6 10*3/uL (ref 0.0–0.7)
Eosinophils Relative: 9.2 % — ABNORMAL HIGH (ref 0.0–5.0)
HEMATOCRIT: 43.9 % (ref 39.0–52.0)
HEMOGLOBIN: 14.9 g/dL (ref 13.0–17.0)
Lymphocytes Relative: 24.8 % (ref 12.0–46.0)
Lymphs Abs: 1.6 10*3/uL (ref 0.7–4.0)
MCHC: 33.9 g/dL (ref 30.0–36.0)
MCV: 89.8 fl (ref 78.0–100.0)
MONO ABS: 0.6 10*3/uL (ref 0.1–1.0)
Monocytes Relative: 9.5 % (ref 3.0–12.0)
Neutro Abs: 3.7 10*3/uL (ref 1.4–7.7)
Neutrophils Relative %: 55.9 % (ref 43.0–77.0)
Platelets: 288 10*3/uL (ref 150.0–400.0)
RBC: 4.89 Mil/uL (ref 4.22–5.81)
RDW: 14 % (ref 11.5–15.5)
WBC: 6.6 10*3/uL (ref 4.0–10.5)

## 2016-04-21 LAB — URINALYSIS, ROUTINE W REFLEX MICROSCOPIC
Bilirubin Urine: NEGATIVE
HGB URINE DIPSTICK: NEGATIVE
KETONES UR: NEGATIVE
LEUKOCYTES UA: NEGATIVE
NITRITE: NEGATIVE
RBC / HPF: NONE SEEN (ref 0–?)
SPECIFIC GRAVITY, URINE: 1.01 (ref 1.000–1.030)
Total Protein, Urine: NEGATIVE
URINE GLUCOSE: NEGATIVE
Urobilinogen, UA: 0.2 (ref 0.0–1.0)
WBC UA: NONE SEEN (ref 0–?)
pH: 7 (ref 5.0–8.0)

## 2016-04-21 LAB — TSH: TSH: 1.15 u[IU]/mL (ref 0.35–4.50)

## 2016-04-21 LAB — PSA: PSA: 3.85 ng/mL (ref 0.10–4.00)

## 2016-04-21 NOTE — Patient Instructions (Signed)

## 2016-04-21 NOTE — Progress Notes (Addendum)
Subjective:    Patient ID: Alexander Pierce, male    DOB: 1950-02-04, 66 y.o.   MRN: [id number]  HPI  Here for wellness and f/u;  Overall doing ok;  Pt denies Chest pain, worsening SOB, DOE, wheezing, orthopnea, PND, worsening LE edema, palpitations, dizziness or syncope.  Pt denies neurological change such as new headache, facial or extremity weakness.  Pt denies polydipsia, polyuria, or low sugar symptoms. Pt states overall good compliance with treatment and medications, good tolerability, and has been trying to follow appropriate diet.  Pt denies worsening depressive symptoms, suicidal ideation or panic. No fever, night sweats, wt loss, loss of appetite, or other constitutional symptoms.  Pt states good ability with ADL's, has low fall risk, home safety reviewed and adequate, no other significant changes in hearing or vision, and only occasionally active with exercise. Has not actually done the cologuard but has the kit at home and plans to do it.  Declines immunizations including the prevnar.Did not take the lipitor after last yr lab, trying to work on diet first.  Past Medical History:  Diagnosis Date  . ALLERGIC RHINITIS 06/29/2007   Qualifier: Diagnosis of  By: Jenny Reichmann MD, Hunt Oris   . Allergy    seasonal  . HYPERLIPIDEMIA 06/29/2007   Qualifier: Diagnosis of  By: Jenny Reichmann MD, Hunt Oris   . Right inguinal hernia 06/24/2011   Past Surgical History:  Procedure Laterality Date  . INGUINAL HERNIA REPAIR    . sinus surgury  1992   dr Ernesto Rutherford    reports that he has never smoked. He has never used smokeless tobacco. He reports that he does not drink alcohol or use drugs. family history is not on file. Allergies  Allergen Reactions  . Acetaminophen     REACTION: eyes swell  . Aspirin     REACTION: eyes swell  . Ibuprofen     REACTION: eyes swell  . Penicillins     REACTION: pt cant remember   No current outpatient prescriptions on file prior to visit.   No current facility-administered  medications on file prior to visit.    Review of Systems Constitutional: Negative for other unusual diaphoresis, sweats, appetite or weight changes HENT: Negative for other worsening hearing loss, ear pain, facial swelling, mouth sores or neck stiffness.   Eyes: Negative for other worsening pain, redness or other visual disturbance.  Respiratory: Negative for other stridor or swelling Cardiovascular: Negative for other palpitations or other chest pain  Gastrointestinal: Negative for worsening diarrhea or loose stools, blood in stool, distention or other pain Genitourinary: Negative for hematuria, flank pain or other change in urine volume.  Musculoskeletal: Negative for myalgias or other joint swelling.  Skin: Negative for other color change, or other wound or worsening drainage.  Neurological: Negative for other syncope or numbness. Hematological: Negative for other adenopathy or swelling Psychiatric/Behavioral: Negative for hallucinations, other worsening agitation, SI, self-injury, or new decreased concentration All other system neg per pt    Objective:   Physical Exam BP 138/88   Pulse 68   Temp 97.6 F (36.4 C) (Oral)   Ht '6\' 2"'  (1.88 m)   Wt 202 lb (91.6 kg)   SpO2 98%   BMI 25.94 kg/m  VS noted, mild overwt Constitutional: Pt is oriented to person, place, and time. Appears well-developed and well-nourished, in no significant distress and comfortable Head: Normocephalic and atraumatic  Eyes: Conjunctivae and EOM are normal. Pupils are equal, round, and reactive to light Right Ear: External  ear normal without discharge Left Ear: External ear normal without discharge Nose: Nose without discharge or deformity Mouth/Throat: Oropharynx is without other ulcerations and moist  Neck: Normal range of motion. Neck supple. No JVD present. No tracheal deviation present or significant neck LA or mass Cardiovascular: Normal rate, regular rhythm, normal heart sounds and intact distal  pulses.   Pulmonary/Chest: WOB normal and breath sounds without rales or wheezing  Abdominal: Soft. Bowel sounds are normal. NT. No HSM  Musculoskeletal: Normal range of motion. Exhibits no edema Lymphadenopathy: Has no other cervical adenopathy.  Neurological: Pt is alert and oriented to person, place, and time. Pt has normal reflexes. No cranial nerve deficit. Motor grossly intact, Gait intact Skin: Skin is warm and dry. No rash noted or new ulcerations Psychiatric:  Has normal mood and affect. Behavior is normal without agitation No other exam findings  ecg today I have personally interpreted   Sinus  Rhythm   - WNL  Assessment & Plan:

## 2016-04-21 NOTE — Assessment & Plan Note (Signed)

## 2016-04-21 NOTE — Progress Notes (Signed)
Pre visit review using our clinic review tool, if applicable. No additional management support is needed unless otherwise documented below in the visit note. 

## 2016-05-03 ENCOUNTER — Ambulatory Visit (INDEPENDENT_AMBULATORY_CARE_PROVIDER_SITE_OTHER): Payer: BC Managed Care – PPO | Admitting: Internal Medicine

## 2016-05-03 ENCOUNTER — Other Ambulatory Visit (INDEPENDENT_AMBULATORY_CARE_PROVIDER_SITE_OTHER): Payer: BC Managed Care – PPO

## 2016-05-03 ENCOUNTER — Telehealth: Payer: Self-pay | Admitting: Internal Medicine

## 2016-05-03 ENCOUNTER — Encounter: Payer: Self-pay | Admitting: Internal Medicine

## 2016-05-03 VITALS — BP 144/94 | HR 65 | Temp 98.5°F | Ht 74.0 in | Wt 204.4 lb

## 2016-05-03 DIAGNOSIS — J069 Acute upper respiratory infection, unspecified: Secondary | ICD-10-CM | POA: Diagnosis not present

## 2016-05-03 DIAGNOSIS — S40862A Insect bite (nonvenomous) of left upper arm, initial encounter: Secondary | ICD-10-CM

## 2016-05-03 DIAGNOSIS — R509 Fever, unspecified: Secondary | ICD-10-CM

## 2016-05-03 DIAGNOSIS — W57XXXA Bitten or stung by nonvenomous insect and other nonvenomous arthropods, initial encounter: Secondary | ICD-10-CM

## 2016-05-03 DIAGNOSIS — E871 Hypo-osmolality and hyponatremia: Secondary | ICD-10-CM | POA: Diagnosis not present

## 2016-05-03 LAB — CBC WITH DIFFERENTIAL/PLATELET
BASOS ABS: 0 10*3/uL (ref 0.0–0.1)
Basophils Relative: 1 % (ref 0.0–3.0)
Eosinophils Absolute: 0.5 10*3/uL (ref 0.0–0.7)
Eosinophils Relative: 9.5 % — ABNORMAL HIGH (ref 0.0–5.0)
HEMATOCRIT: 43.1 % (ref 39.0–52.0)
Hemoglobin: 14.6 g/dL (ref 13.0–17.0)
LYMPHS PCT: 36.1 % (ref 12.0–46.0)
Lymphs Abs: 1.9 10*3/uL (ref 0.7–4.0)
MCHC: 33.9 g/dL (ref 30.0–36.0)
MCV: 88.9 fl (ref 78.0–100.0)
MONOS PCT: 18 % — AB (ref 3.0–12.0)
Monocytes Absolute: 0.9 10*3/uL (ref 0.1–1.0)
NEUTROS ABS: 1.9 10*3/uL (ref 1.4–7.7)
Neutrophils Relative %: 35.4 % — ABNORMAL LOW (ref 43.0–77.0)
PLATELETS: 252 10*3/uL (ref 150.0–400.0)
RBC: 4.86 Mil/uL (ref 4.22–5.81)
RDW: 13.7 % (ref 11.5–15.5)
WBC: 5.2 10*3/uL (ref 4.0–10.5)

## 2016-05-03 LAB — COMPREHENSIVE METABOLIC PANEL
ALT: 17 U/L (ref 0–53)
AST: 19 U/L (ref 0–37)
Albumin: 4.2 g/dL (ref 3.5–5.2)
Alkaline Phosphatase: 72 U/L (ref 39–117)
BILIRUBIN TOTAL: 0.4 mg/dL (ref 0.2–1.2)
BUN: 13 mg/dL (ref 6–23)
CALCIUM: 8.8 mg/dL (ref 8.4–10.5)
CHLORIDE: 95 meq/L — AB (ref 96–112)
CO2: 29 meq/L (ref 19–32)
Creatinine, Ser: 0.95 mg/dL (ref 0.40–1.50)
GFR: 83.9 mL/min (ref >60.00)
Glucose, Bld: 93 mg/dL (ref 70–99)
Potassium: 4.4 mEq/L (ref 3.5–5.1)
Sodium: 129 mEq/L — ABNORMAL LOW (ref 135–145)
Total Protein: 7.4 g/dL (ref 6.0–8.3)

## 2016-05-03 MED ORDER — DOXYCYCLINE MONOHYDRATE 100 MG PO CAPS: 100.0000 mg | ORAL_CAPSULE | Freq: Two times a day (BID) | ORAL | 0 refills | Status: AC

## 2016-05-03 NOTE — Progress Notes (Signed)
Pre visit review using our clinic review tool, if applicable. No additional management support is needed unless otherwise documented below in the visit note. 

## 2016-05-03 NOTE — Patient Instructions (Signed)
Tick Bite Information, Adult Ticks are insects that draw blood for food. Most ticks live in shrubs and grassy areas. They climb onto people and animals that brush against the leaves and grasses that they rest on. Then they bite, attaching themselves to the skin. Most ticks are harmless, but some ticks carry germs that can spread to a person through a bite and cause a disease. To reduce your risk of getting a disease from a tick bite, it is important to take steps to prevent tick bites. It is also important to check for ticks after being outdoors. If you find that a tick has attached to you, watch for symptoms of disease. How can I prevent tick bites? Take these steps to help prevent tick bites when you are outdoors in an area where ticks are found:  Use insect repellent that has DEET (20% or higher), picaridin, or IR3535 in it. Use it on:  Skin that is showing.  The top of your boots.  Your pant legs.  Your sleeve cuffs.  For repellent products that contain permethrin, follow product instructions. Use these products on:  Clothing.  Gear.  Boots.  Tents.  Wear protective clothing. Long sleeves and long pants offer the best protection from ticks.  Wear light-colored clothing so you can see ticks more easily.  Tuck your pant legs into your socks.  If you go walking on a trail, stay in the middle of the trail so your skin, hair, and clothing do not touch the bushes.  Avoid walking through areas with long grass.  Check for ticks on your clothing, hair, and skin often while you are outside, and check again before you go inside. Make sure to check the places that ticks attach themselves most often. These places include the scalp, neck, armpits, waist, groin, and joint areas. Ticks that carry a disease called Lyme disease have to be attached to the skin for 24-48 hours. Checking for ticks every day will lessen your risk of this and other diseases.  When you come indoors, wash your  clothes and take a shower or a bath right away. Dry your clothes in a dryer on high heat for at least 60 minutes. This will kill any ticks in your clothes. What is the proper way to remove a tick? If you find a tick on your body, remove it as soon as possible. Removing a tick sooner rather than later can prevent germs from passing from the tick to your body. To remove a tick that is crawling on your skin but has not bitten:  Go outdoors and brush the tick off.  Remove the tick with tape or a lint roller. To remove a tick that is attached to your skin:  Wash your hands.  If you have latex gloves, put them on.  Use tweezers, curved forceps, or a tick-removal tool to gently grasp the tick as close to your skin and the tick's head as possible.  Gently pull with steady, upward pressure until the tick lets go. When removing the tick:  Take care to keep the tick's head attached to its body.  Do not twist or jerk the tick. This can make the tick's head or mouth break off.  Do not squeeze or crush the tick's body. This could force disease-carrying fluids from the tick into your body. Do not try to remove a tick with heat, alcohol, petroleum jelly, or fingernail polish. Using these methods can cause the tick to salivate and regurgitate into your   bloodstream, increasing your risk of getting a disease. What should I do after removing a tick?  Clean the bite area with soap and water, rubbing alcohol, or an iodine scrub.  If an antiseptic cream or ointment is available, apply a small amount to the bite site.  Wash and disinfect any instruments that you used to remove the tick. How should I dispose of a tick? To dispose of a live tick, use one of these methods:  Place it in rubbing alcohol.  Place it in a sealed bag or container.  Wrap it tightly in tape.  Flush it down the toilet. Contact a health care provider if:  You have symptoms of a disease after a tick bite. Symptoms of a  tick-borne disease can occur from moments after the tick bites to up to 30 days after a tick is removed. Symptoms include:  Muscle, joint, or bone pain.  Difficulty walking or moving your legs.  Numbness in the legs.  Paralysis.  Red rash around the tick bite area that is shaped like a target or a "bull's-eye."  Redness and swelling in the area of the tick bite.  Fever.  Repeated vomiting.  Diarrhea.  Weight loss.  Tender, swollen lymph glands.  Shortness of breath.  Cough.  Pain in the abdomen.  Headache.  Abnormal tiredness.  A change in your level of consciousness.  Confusion. Get help right away if:  You are not able to remove a tick.  A part of a tick breaks off and gets stuck in your skin.  Your symptoms get worse. Summary  Ticks may carry germs that can spread to a person through a bite and cause disease.  Wear protective clothing and use insect repellent to prevent tick bites. Follow product instructions.  If you find a tick on your body, remove it as soon as possible. If the tick is attached, do not try to remove with heat, alcohol, petroleum jelly, or fingernail polish.  Remove the attached tick using tweezers, curved forceps, or a tick-removal tool. Gently pull with steady, upward pressure until the tick lets go. Do not twist or jerk the tick. Do not squeeze or crush the tick's body.  If you have symptoms after being bitten by a tick, contact a health care provider. This information is not intended to replace advice given to you by your health care provider. Make sure you discuss any questions you have with your health care provider. Document Released: 01/02/2000 Document Revised: 10/17/2015 Document Reviewed: 10/17/2015 Elsevier Interactive Patient Education  2017 [employer]  

## 2016-05-03 NOTE — Progress Notes (Signed)
Subjective:  Patient ID: Alexander Pierce, male    DOB: 1950/07/09  Age: 66 y.o. MRN: [id number]  CC: Fever and Tick Removal   HPI Alexander Pierce presents for concerns about a tick bite on his left upper arm. He removed a tick 2 days ago and the site looks normal but over last few days he has developed a mild headache with chills, low-grade fever, sneezing, runny nose, scratchy throat, and muscle aches. He denies nausea, vomiting, cough, abdominal pain, diarrhea, lymphadenopathy, dizziness, lightheadedness, or dysuria. He had lab work done 12 days prior to this visit and it showed a mildly elevated potassium at 5.2 and a slightly low sodium at 133. His calcium was normal at 9.2 and his renal function was normal with a creatinine of 0.95 and a normal BUN 12.  No outpatient prescriptions prior to visit.   No facility-administered medications prior to visit.     ROS Review of Systems  Constitutional: Positive for chills and fever. Negative for activity change, appetite change, diaphoresis, fatigue and unexpected weight change.  HENT: Positive for sore throat. Negative for sinus pressure and trouble swallowing.   Eyes: Negative for photophobia, pain and visual disturbance.  Respiratory: Negative.  Negative for cough, shortness of breath and wheezing.   Gastrointestinal: Negative for abdominal pain, constipation, diarrhea, nausea and vomiting.  Endocrine: Negative.   Genitourinary: Negative.  Negative for difficulty urinating, flank pain, frequency and hematuria.  Musculoskeletal: Positive for myalgias. Negative for back pain, neck pain and neck stiffness.  Skin: Negative.  Negative for color change and rash.  Allergic/Immunologic: Negative.   Neurological: Positive for headaches. Negative for dizziness, seizures, syncope, weakness and light-headedness.  Hematological: Negative for adenopathy. Does not bruise/bleed easily.  Psychiatric/Behavioral: Negative.     Objective:  BP (!) 144/94 (BP  Location: Left Arm, Patient Position: Sitting, Cuff Size: Normal)   Pulse 65   Temp 98.5 F (36.9 C) (Oral)   Ht  (1.88 m)   Wt 204 lb 6 oz (92.7 kg)   SpO2 98%   BMI 26.24 kg/m   BP Readings from Last 3 Encounters:  05/03/16 (!) 144/94  04/21/16 138/88  05/19/15 134/80    Wt Readings from Last 3 Encounters:  05/03/16 204 lb 6 oz (92.7 kg)  04/21/16 202 lb (91.6 kg)  05/19/15 200 lb (90.7 kg)    Physical Exam  Constitutional: He is oriented to person, place, and time.  Non-toxic appearance. He does not have a sickly appearance. He does not appear ill. No distress.  HENT:  Mouth/Throat: Oropharynx is clear and moist. No oropharyngeal exudate.  Eyes: Conjunctivae are normal. Right eye exhibits no discharge. Left eye exhibits no discharge. No scleral icterus.  Neck: Normal range of motion. Neck supple. No JVD present. No tracheal deviation present. No thyromegaly present.  Cardiovascular: Normal rate, regular rhythm, normal heart sounds and intact distal pulses.  Exam reveals no gallop and no friction rub.   No murmur heard. Pulmonary/Chest: Effort normal and breath sounds normal. No stridor. No respiratory distress. He has no wheezes. He has no rales. He exhibits no tenderness.  Abdominal: Soft. Bowel sounds are normal. He exhibits no distension and no mass. There is no tenderness. There is no rebound and no guarding.  Musculoskeletal: Normal range of motion. He exhibits no edema, tenderness or deformity.  Lymphadenopathy:    He has no cervical adenopathy.  Neurological: He is oriented to person, place, and time.  Skin: Skin is warm and dry.  No rash noted. He is not diaphoretic. No erythema. No pallor.  Vitals reviewed.   Lab Results  Component Value Date   WBC 5.2 05/03/2016   HGB 14.6 05/03/2016   HCT 43.1 05/03/2016   PLT 252.0 05/03/2016   GLUCOSE 93 05/03/2016   CHOL 217 (H) 04/21/2016   TRIG 134.0 04/21/2016   HDL 42.50 04/21/2016   LDLCALC 148 (H)  04/21/2016   ALT 17 05/03/2016   AST 19 05/03/2016   NA 129 (L) 05/03/2016   K 4.4 05/03/2016   CL 95 (L) 05/03/2016   CREATININE 0.95 05/03/2016   BUN 13 05/03/2016   CO2 29 05/03/2016   TSH 1.15 04/21/2016   PSA 3.85 04/21/2016    Dg Knee Bilateral Standing Ap  Result Date: 02/23/2013 CLINICAL DATA:  Right knee pain and swelling.  Arthrocentesis today. EXAM: BILATERAL KNEES STANDING - 1 VIEW COMPARISON:  None. FINDINGS: Standing AP view of both knees demonstrates mild symmetric medial joint space loss. There is no evidence of acute fracture or focal osseous abnormality. The alignment is normal. IMPRESSION: Mild symmetric medial joint space loss in both knees. Electronically Signed   By: Roxy Horseman M.D.   On: 02/23/2013 16:23   Dg Knee Complete 4 Views Right  Result Date: 02/23/2013 CLINICAL DATA:  Right knee pain and swelling. Arthrocentesis today. EXAM: RIGHT KNEE - COMPLETE 4+ VIEW COMPARISON:  None. FINDINGS: The mineralization and alignment are normal. There is no evidence of acute fracture or dislocation. There is mild medial compartment joint space loss. There is mild spurring at the quadriceps insertion on the patella and a small to moderate residual suprapatellar joint effusion. IMPRESSION: Small to moderate residual joint effusion following reported arthrocentesis. No acute osseous findings. Electronically Signed   By: Roxy Horseman M.D.   On: 02/23/2013 16:25    Assessment & Plan:   Alexander Pierce was seen today for fever and tick removal.  Diagnoses and all orders for this visit:  Tick bite of left upper arm, initial encounter- he has mild constitutional symptoms, no rash, normal CBC and normal liver enzymes so I don't think he has tick borne illness but will prophylax against RMSF and Lyme dz with a course of doxycycline -     Comprehensive metabolic panel; Future -     CBC with Differential/Platelet; Future -     Rocky mtn spotted fvr abs pnl(IgG+IgM); Future -     doxycycline  (MONODOX) 100 MG capsule; Take 1 capsule (100 mg total) by mouth 2 (two) times daily.  Fever, unspecified fever cause- as above -     Comprehensive metabolic panel; Future -     CBC with Differential/Platelet; Future -     Rocky mtn spotted fvr abs pnl(IgG+IgM); Future  Upper respiratory tract infection, unspecified type- he appears to have a viral syndrome, he does not request anything to help control the symptoms.  Hyponatremia- over the last 12 days his sodium is decreased from 133 now down to 129, his chloride is low as well, his potassium and chloride are normal, he does not appear to be in the midst of an adrenal insufficiency crisis since he has an above normal blood pressure and no symptoms of  hyponatremia. I have asked him to return to recheck the sodium level as well as to screen for adrenal insufficiency and SIADH. -     Basic metabolic panel; Future -     Cortisol; Future -     ACTH; Future -  Urinalysis, Routine w reflex microscopic; Future -     Sodium, urine, random; Future   I am having Alexander Pierce start on doxycycline.  Meds ordered this encounter  Medications  . doxycycline (MONODOX) 100 MG capsule    Sig: Take 1 capsule (100 mg total) by mouth 2 (two) times daily.    Dispense:  28 capsule    Refill:  0     Follow-up: Return in about 3 weeks (around 05/24/2016).  Sanda Linger, MD

## 2016-05-04 DIAGNOSIS — E871 Hypo-osmolality and hyponatremia: Secondary | ICD-10-CM | POA: Insufficient documentation

## 2016-05-07 ENCOUNTER — Other Ambulatory Visit (INDEPENDENT_AMBULATORY_CARE_PROVIDER_SITE_OTHER): Payer: BC Managed Care – PPO

## 2016-05-07 DIAGNOSIS — E871 Hypo-osmolality and hyponatremia: Secondary | ICD-10-CM | POA: Diagnosis not present

## 2016-05-07 LAB — URINALYSIS, ROUTINE W REFLEX MICROSCOPIC
BILIRUBIN URINE: NEGATIVE
HGB URINE DIPSTICK: NEGATIVE
KETONES UR: NEGATIVE
LEUKOCYTES UA: NEGATIVE
Nitrite: NEGATIVE
RBC / HPF: NONE SEEN (ref 0–?)
Specific Gravity, Urine: 1.015 (ref 1.000–1.030)
Total Protein, Urine: NEGATIVE
UROBILINOGEN UA: 0.2 (ref 0.0–1.0)
Urine Glucose: NEGATIVE
WBC UA: NONE SEEN (ref 0–?)
pH: 7 (ref 5.0–8.0)

## 2016-05-07 LAB — BASIC METABOLIC PANEL
BUN: 16 mg/dL (ref 6–23)
CALCIUM: 8.9 mg/dL (ref 8.4–10.5)
CO2: 31 meq/L (ref 19–32)
Chloride: 100 mEq/L (ref 96–112)
Creatinine, Ser: 0.99 mg/dL (ref 0.40–1.50)
GFR: 80 mL/min (ref >60.00)
GLUCOSE: 82 mg/dL (ref 70–99)
Potassium: 4.3 mEq/L (ref 3.5–5.1)
SODIUM: 135 meq/L (ref 135–145)

## 2016-05-07 LAB — CORTISOL: Cortisol, Plasma: 6.2 ug/dL

## 2016-05-08 LAB — ROCKY MTN SPOTTED FVR ABS PNL(IGG+IGM)
RMSF IGG: NOT DETECTED
RMSF IGM: NOT DETECTED

## 2016-05-08 LAB — SODIUM, URINE, RANDOM: Sodium, Ur: 79 mmol/L (ref 28–272)

## 2016-05-10 LAB — ACTH: C206 ACTH: 18 pg/mL (ref 6–50)

## 2016-12-22 NOTE — Telephone Encounter (Signed)
error 

## 2017-04-22 ENCOUNTER — Encounter: Payer: BC Managed Care – PPO | Admitting: Internal Medicine

## 2017-04-27 ENCOUNTER — Other Ambulatory Visit (INDEPENDENT_AMBULATORY_CARE_PROVIDER_SITE_OTHER): Payer: BC Managed Care – PPO

## 2017-04-27 ENCOUNTER — Encounter: Payer: Self-pay | Admitting: Internal Medicine

## 2017-04-27 ENCOUNTER — Other Ambulatory Visit: Payer: Self-pay | Admitting: Internal Medicine

## 2017-04-27 ENCOUNTER — Ambulatory Visit (INDEPENDENT_AMBULATORY_CARE_PROVIDER_SITE_OTHER): Payer: BC Managed Care – PPO | Admitting: Internal Medicine

## 2017-04-27 VITALS — BP 124/86 | HR 61 | Temp 98.1°F | Ht 74.0 in | Wt 209.0 lb

## 2017-04-27 DIAGNOSIS — R972 Elevated prostate specific antigen [PSA]: Secondary | ICD-10-CM

## 2017-04-27 DIAGNOSIS — Z0000 Encounter for general adult medical examination without abnormal findings: Secondary | ICD-10-CM

## 2017-04-27 LAB — HEPATIC FUNCTION PANEL
ALBUMIN: 4.3 g/dL (ref 3.5–5.2)
ALK PHOS: 66 U/L (ref 39–117)
ALT: 20 U/L (ref 0–53)
AST: 21 U/L (ref 0–37)
BILIRUBIN TOTAL: 0.5 mg/dL (ref 0.2–1.2)
Bilirubin, Direct: 0.1 mg/dL (ref 0.0–0.3)
Total Protein: 7.2 g/dL (ref 6.0–8.3)

## 2017-04-27 LAB — CBC WITH DIFFERENTIAL/PLATELET
BASOS ABS: 0.1 10*3/uL (ref 0.0–0.1)
Basophils Relative: 1.1 % (ref 0.0–3.0)
Eosinophils Absolute: 0.5 10*3/uL (ref 0.0–0.7)
Eosinophils Relative: 8.5 % — ABNORMAL HIGH (ref 0.0–5.0)
HCT: 44.1 % (ref 39.0–52.0)
HEMOGLOBIN: 15.1 g/dL (ref 13.0–17.0)
Lymphocytes Relative: 26.8 % (ref 12.0–46.0)
Lymphs Abs: 1.6 10*3/uL (ref 0.7–4.0)
MCHC: 34.2 g/dL (ref 30.0–36.0)
MCV: 89.2 fl (ref 78.0–100.0)
MONOS PCT: 10.4 % (ref 3.0–12.0)
Monocytes Absolute: 0.6 10*3/uL (ref 0.1–1.0)
Neutro Abs: 3.3 10*3/uL (ref 1.4–7.7)
Neutrophils Relative %: 53.2 % (ref 43.0–77.0)
Platelets: 275 10*3/uL (ref 150.0–400.0)
RBC: 4.94 Mil/uL (ref 4.22–5.81)
RDW: 13.9 % (ref 11.5–15.5)
WBC: 6.1 10*3/uL (ref 4.0–10.5)

## 2017-04-27 LAB — LIPID PANEL
CHOL/HDL RATIO: 5
Cholesterol: 196 mg/dL (ref 0–200)
HDL: 39.8 mg/dL (ref >39.00)
LDL Cholesterol: 136 mg/dL — ABNORMAL HIGH (ref 0–99)
NONHDL: 156.46
Triglycerides: 101 mg/dL (ref 0.0–149.0)
VLDL: 20.2 mg/dL (ref 0.0–40.0)

## 2017-04-27 LAB — URINALYSIS, ROUTINE W REFLEX MICROSCOPIC
Bilirubin Urine: NEGATIVE
HGB URINE DIPSTICK: NEGATIVE
Ketones, ur: NEGATIVE
Leukocytes, UA: NEGATIVE
Nitrite: NEGATIVE
PH: 6 (ref 5.0–8.0)
RBC / HPF: NONE SEEN (ref 0–?)
SPECIFIC GRAVITY, URINE: 1.015 (ref 1.000–1.030)
TOTAL PROTEIN, URINE-UPE24: NEGATIVE
UROBILINOGEN UA: 0.2 (ref 0.0–1.0)
Urine Glucose: NEGATIVE

## 2017-04-27 LAB — BASIC METABOLIC PANEL
BUN: 11 mg/dL (ref 6–23)
CALCIUM: 9 mg/dL (ref 8.4–10.5)
CO2: 29 meq/L (ref 19–32)
CREATININE: 0.96 mg/dL (ref 0.40–1.50)
Chloride: 99 mEq/L (ref 96–112)
GFR: 82.65 mL/min (ref >60.00)
GLUCOSE: 95 mg/dL (ref 70–99)
Potassium: 5 mEq/L (ref 3.5–5.1)
Sodium: 134 mEq/L — ABNORMAL LOW (ref 135–145)

## 2017-04-27 LAB — PSA: PSA: 6.53 ng/mL — ABNORMAL HIGH (ref 0.10–4.00)

## 2017-04-27 LAB — TSH: TSH: 1.08 u[IU]/mL (ref 0.35–4.50)

## 2017-04-27 MED ORDER — DOXYCYCLINE HYCLATE 100 MG PO TABS: 100.0000 mg | ORAL_TABLET | Freq: Two times a day (BID) | ORAL | 0 refills | Status: DC

## 2017-04-27 NOTE — Progress Notes (Signed)
Subjective:    Patient ID: Alexander DeitersJohn M Guidone, male    DOB: [birth year], 67 y.o.   MRN: [id number]  HPI  Here for wellness and f/u;  Overall doing ok;  Pt denies Chest pain, worsening SOB, DOE, wheezing, orthopnea, PND, worsening LE edema, palpitations, dizziness or syncope.  Pt denies neurological change such as new headache, facial or extremity weakness.  Pt denies polydipsia, polyuria, or low sugar symptoms. Pt states overall good compliance with treatment and medications, good tolerability, and has been trying to follow appropriate diet.  Pt denies worsening depressive symptoms, suicidal ideation or panic. No fever, night sweats, wt loss, loss of appetite, or other constitutional symptoms.  Pt states good ability with ADL's, has low fall risk, home safety reviewed and adequate, no other significant changes in hearing or vision, and only occasionally active with exercise. Wt Readings from Last 3 Encounters:  04/27/17 209 lb (94.8 kg)  05/03/16 204 lb 6 oz (92.7 kg)  04/21/16 202 lb (91.6 kg)  Declines prevnar. Plans on doing the cologuard, just has not done it yet.  Has seen urology with f/u psa - stable to improved now.  No new complaints or other interval hx Past Medical History:  Diagnosis Date  . ALLERGIC RHINITIS 06/29/2007   Qualifier: Diagnosis of  By: Jonny RuizJohn MD, Len BlalockJames W   . Allergy    seasonal  . HYPERLIPIDEMIA 06/29/2007   Qualifier: Diagnosis of  By: Jonny RuizJohn MD, Len BlalockJames W   . Right inguinal hernia 06/24/2011   Past Surgical History:  Procedure Laterality Date  . INGUINAL HERNIA REPAIR    . sinus surgury  1992   dr Haroldine Lawscrossley    reports that he has never smoked. He has never used smokeless tobacco. He reports that he does not drink alcohol or use drugs. family history includes Hypertension in his unknown relative; Parkinsonism in his unknown relative; Prostate cancer in his unknown relative. Allergies  Allergen Reactions  . Acetaminophen     REACTION: eyes swell  . Aspirin     REACTION:  eyes swell  . Ibuprofen     REACTION: eyes swell  . Penicillins     REACTION: pt cant remember   No current outpatient medications on file prior to visit.   No current facility-administered medications on file prior to visit.    Review of Systems Constitutional: Negative for other unusual diaphoresis, sweats, appetite or weight changes HENT: Negative for other worsening hearing loss, ear pain, facial swelling, mouth sores or neck stiffness.   Eyes: Negative for other worsening pain, redness or other visual disturbance.  Respiratory: Negative for other stridor or swelling Cardiovascular: Negative for other palpitations or other chest pain  Gastrointestinal: Negative for worsening diarrhea or loose stools, blood in stool, distention or other pain Genitourinary: Negative for hematuria, flank pain or other change in urine volume.  Musculoskeletal: Negative for myalgias or other joint swelling.  Skin: Negative for other color change, or other wound or worsening drainage.  Neurological: Negative for other syncope or numbness. Hematological: Negative for other adenopathy or swelling Psychiatric/Behavioral: Negative for hallucinations, other worsening agitation, SI, self-injury, or new decreased concentration All other system neg per pt    Objective:   Physical Exam BP 124/86   Pulse 61   Temp 98.1 F (36.7 C) (Oral)   Ht 6\' 2"  (1.88 m)   Wt 209 lb (94.8 kg)   SpO2 99%   BMI 26.83 kg/m  VS noted,  Constitutional: Pt is oriented to person, place,  and time. Appears well-developed and well-nourished, in no significant distress and comfortable Head: Normocephalic and atraumatic  Eyes: Conjunctivae and EOM are normal. Pupils are equal, round, and reactive to light Right Ear: External ear normal without discharge Left Ear: External ear normal without discharge Nose: Nose without discharge or deformity Mouth/Throat: Oropharynx is without other ulcerations and moist  Neck: Normal range of  motion. Neck supple. No JVD present. No tracheal deviation present or significant neck LA or mass Cardiovascular: Normal rate, regular rhythm, normal heart sounds and intact distal pulses.   Pulmonary/Chest: WOB normal and breath sounds without rales or wheezing  Abdominal: Soft. Bowel sounds are normal. NT. No HSM  Musculoskeletal: Normal range of motion. Exhibits no edema Lymphadenopathy: Has no other cervical adenopathy.  Neurological: Pt is alert and oriented to person, place, and time. Pt has normal reflexes. No cranial nerve deficit. Motor grossly intact, Gait intact Skin: Skin is warm and dry. No rash noted or new ulcerations Psychiatric:  Has normal mood and affect. Behavior is normal without agitation No other exam findings  Lab Results  Component Value Date   WBC 5.2 05/03/2016   HGB 14.6 05/03/2016   HCT 43.1 05/03/2016   PLT 252.0 05/03/2016   GLUCOSE 82 05/07/2016   CHOL 217 (H) 04/21/2016   TRIG 134.0 04/21/2016   HDL 42.50 04/21/2016   LDLCALC 148 (H) 04/21/2016   ALT 17 05/03/2016   AST 19 05/03/2016   NA 135 05/07/2016   K 4.3 05/07/2016   CL 100 05/07/2016   CREATININE 0.99 05/07/2016   BUN 16 05/07/2016   CO2 31 05/07/2016   TSH 1.15 04/21/2016   PSA 3.85 04/21/2016       Assessment & Plan:

## 2017-04-27 NOTE — Patient Instructions (Addendum)

## 2017-04-28 ENCOUNTER — Telehealth: Payer: Self-pay

## 2017-04-28 NOTE — Telephone Encounter (Signed)
-----   Message from Corwin LevinsJames W Zebulan, MD sent at 04/27/2017 12:53 PM EDT ----- Letter sent, cont same tx except   The test results show that your current treatment is OK, except the PSA is quite a bit elevated.  This happens occasionally due to low grade infection, and not necessarily prostate cancer.  For you, we should treat with an antibiotic for 4 wks (doxycyline twice daily), then ask you to repeat the PSA in 4 weeks at the lab (no office visit needed).  If the PSA remains elevated, we would need you to return to Urology for further consideration.  I will send the prescription, and you should hear from the office as well.    Shirron to please inform pt, I will do rx and lab order

## 2017-04-28 NOTE — Telephone Encounter (Signed)
Pt has been informed and expressed understanding.  

## 2017-04-30 NOTE — Assessment & Plan Note (Signed)

## 2018-01-23 ENCOUNTER — Ambulatory Visit: Payer: BC Managed Care – PPO | Admitting: Family Medicine

## 2018-01-23 ENCOUNTER — Ambulatory Visit: Payer: Self-pay

## 2018-01-23 ENCOUNTER — Encounter: Payer: Self-pay | Admitting: Family Medicine

## 2018-01-23 VITALS — BP 130/80 | HR 72 | Ht 74.0 in | Wt 214.0 lb

## 2018-01-23 DIAGNOSIS — M25521 Pain in right elbow: Secondary | ICD-10-CM | POA: Diagnosis not present

## 2018-01-23 DIAGNOSIS — M7711 Lateral epicondylitis, right elbow: Secondary | ICD-10-CM | POA: Insufficient documentation

## 2018-01-23 NOTE — Patient Instructions (Addendum)
Good to see you  Ice 20 minutes 2 times daily. Usually after activity and before bed. Exercises 3 times a week.  pennsaid pinkie amount topically 2 times daily as needed.  Wrist brace day and night for 2 weeks then nightly for another 2 weeks Lift only underhand of thumbs up  Vitamin D 2000 IU daily  See me again in 4-6 weeks Happy New Year!

## 2018-01-23 NOTE — Assessment & Plan Note (Signed)
Right lateral epicondylitis.  Discussed icing regimen and home exercise.  Discussed which activities to do which wants to avoid.  Increase activity slowly over the course the next several days.  Follow-up with me again in 4 to 6 weeks

## 2018-01-23 NOTE — Progress Notes (Signed)
Alexander Pierce, [place] Pierce Subjective:    I Alexander Pierce am serving as a Neurosurgeon for Dr. Antoine Primas.    CC: Right elbow pain   [id number]  Alexander Pierce is a 68 y.o. male coming in with complaint of right elbow pain. Pain is "off and on". Tree limb fell on it. Has good ROM but lifting things is painful. Neck has general stiffness. No numbness and tingling noted.  Onset- Chronic Location- Elbow but radiates up and down the arm  Character- burning sensation, achy, dull Aggravating factors- Reliving factors-  Therapies tried-  Severity-5 out of 10     Past Medical History:  Diagnosis Date  . ALLERGIC RHINITIS 06/29/2007   Qualifier: Diagnosis of  By: Jonny Ruiz MD, Len Blalock   . Allergy    seasonal  . HYPERLIPIDEMIA 06/29/2007   Qualifier: Diagnosis of  By: Jonny Ruiz MD, Len Blalock   . Right inguinal hernia 06/24/2011   Past Surgical History:  Procedure Laterality Date  . INGUINAL HERNIA REPAIR    . sinus surgury  1992   dr Haroldine Laws   Social History   Socioeconomic History  . Marital status: Married    Spouse name: Not on file  . Number of children: Not on file  . Years of education: Not on file  . Highest education level: Not on file  Occupational History  . Not on file  Social Needs  . Financial resource strain: Not on file  . Food insecurity:    Worry: Not on file    Inability: Not on file  . Transportation needs:    Medical: Not on file    Non-medical: Not on file  Tobacco Use  . Smoking status: Never Smoker  . Smokeless tobacco: Never Used  Substance and Sexual Activity  . Alcohol use: No  . Drug use: No  . Sexual activity: Not on file  Lifestyle  . Physical activity:    Days per week: Not on file    Minutes per session: Not on file  . Stress: Not on file  Relationships  . Social connections:    Talks on phone: Not on file    Gets together: Not on file    Attends religious  service: Not on file    Active member of club or organization: Not on file    Attends meetings of clubs or organizations: Not on file    Relationship status: Not on file  Other Topics Concern  . Not on file  Social History Narrative  . Not on file   Allergies  Allergen Reactions  . Acetaminophen     REACTION: eyes swell  . Aspirin     REACTION: eyes swell  . Ibuprofen     REACTION: eyes swell  . Penicillins     REACTION: pt cant remember   Family History  Problem Relation Age of Onset  . Parkinsonism Unknown   . Prostate cancer Unknown   . Hypertension Unknown   . Colon cancer Neg Hx   . Rectal cancer Neg Hx   . Stomach cancer Neg Hx          Current Outpatient Medications (Other):  .  doxycycline (VIBRA-TABS) 100 MG tablet, Take 1 tablet (100 mg total) by mouth 2 (two) times daily. (Patient not taking: Reported on 01/23/2018)    Past medical history, social, surgical and family history all reviewed in electronic medical record.  No pertanent  information unless stated regarding to the chief complaint.   Review of Systems:  No headache, visual changes, nausea, vomiting, diarrhea, constipation, dizziness, abdominal pain, skin rash, fevers, chills, night sweats, weight loss, swollen lymph nodes, body aches, joint swelling,  chest pain, shortness of breath, mood changes.  Positive muscle aches  Objective  Blood pressure 130/80, pulse 72, height 6\' 2"  (1.88 m), weight 214 lb (97.1 kg), SpO2 96 %.    General: No apparent distress alert and oriented x3 mood and affect normal, dressed appropriately.  HEENT: Pupils equal, extraocular movements intact  Respiratory: Patient's speak in full sentences and does not appear short of breath  Cardiovascular: No lower extremity edema, non tender, no erythema  Skin: Warm dry intact with no signs of infection or rash on extremities or on axial skeleton.  Abdomen: Soft nontender  Neuro: Cranial nerves II through XII are intact,  neurovascularly intact in all extremities with 2+ DTRs and 2+ pulses.  Lymph: No lymphadenopathy of posterior or anterior cervical chain or axillae bilaterally.  Gait antalgic.  MSK:  Non tender with full range of motion and good stability and symmetric strength and tone of shoulders,, wrist, hip, knee and ankles bilaterally.  Elbow: Right Unremarkable to inspection. Range of motion full pronation, supination, flexion, extension. Strength is full to all of the above directions Stable to varus, valgus stress. Negative moving valgus stress test. Tender to palpation over the lateral epicondylar region Ulnar nerve does not sublux. Negative cubital tunnel Tinel's. Contralateral elbow unremarkable  Musculoskeletal ultrasound was performed and interpreted by Terrilee FilesZach  D.O.   Elbow:  Lateral epicondyle and common extensor tendon origin visualized.  Hypoechoic changes with severe calcific changes and what appears to be a bone spur noted.  Increasing Doppler flow noted.  Intrasubstance tearing noted to the common extensor tendon  IMPRESSION: Severe lateral epicondylitis with calcific changes and true tearing   Impression and Recommendations:     This case required medical decision making of moderate complexity. The above documentation has been reviewed and is accurate and complete Judi SaaZachary M , DO       Note: This dictation was prepared with Dragon dictation along with smaller phrase technology. Any transcriptional errors that result from this process are unintentional.

## 2018-03-05 NOTE — Progress Notes (Signed)
[place] [address] [place], [place] [address] Phone: [phone] Subjective:   Alexander Pierce, am serving as a scribe for Dr. Antoine Primas.   CC: Elbow pain follow-up  YOV:[id number]   01/23/2018: Right lateral epicondylitis.  Discussed icing regimen and home exercise.  Discussed which activities to do which wants to avoid.  Increase activity slowly over the course the next several days.  Follow-up with me again in 4 to 6 weeks  Update 03/06/2018:  Alexander Pierce is a 68 y.o. male coming in with complaint of right elbow pain. Patient states that he feels that he is improving. Is having some tenderness over the lateral epicondyle still when he is lying on his side.      Past Medical History:  Diagnosis Date  . ALLERGIC RHINITIS 06/29/2007   Qualifier: Diagnosis of  By: Jonny Ruiz MD, Len Blalock   . Allergy    seasonal  . HYPERLIPIDEMIA 06/29/2007   Qualifier: Diagnosis of  By: Jonny Ruiz MD, Len Blalock   . Right inguinal hernia 06/24/2011   Past Surgical History:  Procedure Laterality Date  . INGUINAL HERNIA REPAIR    . sinus surgury  1992   dr Haroldine Laws   Social History   Socioeconomic History  . Marital status: Married    Spouse name: Not on file  . Number of children: Not on file  . Years of education: Not on file  . Highest education level: Not on file  Occupational History  . Not on file  Social Needs  . Financial resource strain: Not on file  . Food insecurity:    Worry: Not on file    Inability: Not on file  . Transportation needs:    Medical: Not on file    Non-medical: Not on file  Tobacco Use  . Smoking status: Never Smoker  . Smokeless tobacco: Never Used  Substance and Sexual Activity  . Alcohol use: No  . Drug use: No  . Sexual activity: Not on file  Lifestyle  . Physical activity:    Days per week: Not on file    Minutes per session: Not on file  . Stress: Not on file  Relationships  . Social connections:    Talks on phone:  Not on file    Gets together: Not on file    Attends religious service: Not on file    Active member of club or organization: Not on file    Attends meetings of clubs or organizations: Not on file    Relationship status: Not on file  Other Topics Concern  . Not on file  Social History Narrative  . Not on file   Allergies  Allergen Reactions  . Acetaminophen     REACTION: eyes swell  . Aspirin     REACTION: eyes swell  . Ibuprofen     REACTION: eyes swell  . Penicillins     REACTION: pt cant remember   Family History  Problem Relation Age of Onset  . Parkinsonism Unknown   . Prostate cancer Unknown   . Hypertension Unknown   . Colon cancer Neg Hx   . Rectal cancer Neg Hx   . Stomach cancer Neg Hx      Current Outpatient Medications (Cardiovascular):  .  nitroGLYCERIN (NITRODUR - DOSED IN MG/24 HR) 0.2 mg/hr patch, 1/4 patch daily        Past medical history, social, surgical and family history all reviewed in electronic medical record.  No pertanent information unless stated regarding to the chief complaint.   Review of Systems:  No headache, visual changes, nausea, vomiting, diarrhea, constipation, dizziness, abdominal pain, skin rash, fevers, chills, night sweats, weight loss, swollen lymph nodes, body aches, joint swelling, , chest pain, shortness of breath, mood changes.  Positive muscle aches  Objective  Blood pressure 128/82, pulse 78, height 6\' 2"  (1.88 m), weight 214 lb (97.1 kg), SpO2 98 %.   General: No apparent distress alert and oriented x3 mood and affect normal, dressed appropriately.  HEENT: Pupils equal, extraocular movements intact  Respiratory: Patient's speak in full sentences and does not appear short of breath  Cardiovascular: No lower extremity edema, non tender, no erythema  Skin: Warm dry intact with no signs of infection or rash on extremities or on axial skeleton.  Abdomen: Soft nontender  Neuro: Cranial nerves II through XII are  intact, neurovascularly intact in all extremities with 2+ DTRs and 2+ pulses.  Lymph: No lymphadenopathy of posterior or anterior cervical chain or axillae bilaterally.  Gait normal with good balance and coordination.  MSK:  Non tender with full range of motion and good stability and symmetric strength and tone of shoulders,  wrist, hip, knee and ankles bilaterally.  Elbow: Right  Stable to varus, valgus stress. Negative moving valgus stress test. Mild tenderness still over the lateral epicondylar region Negative cubital tunnel Tinel's.  Musculoskeletal ultrasound was performed and interpreted by Alexander Pierce D.O.   Elbow:  Lateral epicondyle and common extensor tendon origin visualized.  Patient continues to have intrasubstance tearing with calcific changes.  Some interval healing noted.  Increase in Doppler flow in neovascularization noted.  Still enlarged near high-grade tear noted.   IMPRESSION: Common extensor tendon tear with mild interval healing Impression and Recommendations:     This case required medical decision making of moderate complexity. The above documentation has been reviewed and is accurate and complete Alexander Saa, DO       Note: This dictation was prepared with Dragon dictation along with smaller phrase technology. Any transcriptional errors that result from this process are unintentional.

## 2018-03-06 ENCOUNTER — Ambulatory Visit: Payer: Self-pay

## 2018-03-06 ENCOUNTER — Encounter: Payer: Self-pay | Admitting: Family Medicine

## 2018-03-06 ENCOUNTER — Other Ambulatory Visit: Payer: BC Managed Care – PPO

## 2018-03-06 ENCOUNTER — Ambulatory Visit: Payer: BC Managed Care – PPO | Admitting: Family Medicine

## 2018-03-06 VITALS — BP 128/82 | HR 78 | Ht 74.0 in | Wt 214.0 lb

## 2018-03-06 DIAGNOSIS — M25521 Pain in right elbow: Secondary | ICD-10-CM

## 2018-03-06 DIAGNOSIS — M7711 Lateral epicondylitis, right elbow: Secondary | ICD-10-CM | POA: Diagnosis not present

## 2018-03-06 MED ORDER — NITROGLYCERIN 0.2 MG/HR TD PT24: MEDICATED_PATCH | TRANSDERMAL | 1 refills | Status: DC

## 2018-03-06 NOTE — Assessment & Plan Note (Signed)
Improvement noted but not as much is 1 1 PM appreciated.  Continue vitamin D, nitroglycerin given warned of potential side effects and drug interactions.  Patient is still willing to try this.  Discussed icing regimen and home exercise.  Discussed which activities to do which wants to avoid.  Patient will continue to avoid repetitive wrist extension.  Follow-up again in 4 to 8 weeks.

## 2018-03-06 NOTE — Patient Instructions (Addendum)
Great to see you  You are doing great  Keep it up  Try to avoid a lot of wrist extension.  Nitroglycerin Protocol   Apply 1/4 nitroglycerin patch to affected area daily.  Change position of patch within the affected area every 12 hours. 12 hours on during the day then off  You may experience a headache during the first 1-2 weeks of using the patch, these should subside.  If you experience headaches after beginning nitroglycerin patch treatment, you may take your preferred over the counter pain reliever.  Another side effect of the nitroglycerin patch is skin irritation or rash related to patch adhesive.  Please notify our office if you develop more severe headaches or rash, and stop the patch.  Tendon healing with nitroglycerin patch may require 12 to 24 weeks depending on the extent of injury.  Men should not use if taking Viagra, Cialis, or Levitra.   Do not use if you have migraines or rosacea.   See me again in 4 weeks

## 2018-04-08 NOTE — Progress Notes (Deleted)
Alexander Pierce, Alexander Pierce Phone: [phone] Subjective:    I'm seeing this patient by the request  of:    CC: Elbow pain follow-up  [id number]  Alexander Pierce is a 68 y.o. male coming in with complaint of ***  Onset-  Location Duration-  Character- Aggravating factors- Reliving factors-  Therapies tried-  Severity-     Past Medical History:  Diagnosis Date  . ALLERGIC RHINITIS 06/29/2007   Qualifier: Diagnosis of  By: Jonny Ruiz MD, Len Blalock   . Allergy    seasonal  . HYPERLIPIDEMIA 06/29/2007   Qualifier: Diagnosis of  By: Jonny Ruiz MD, Len Blalock   . Right inguinal hernia 06/24/2011   Past Surgical History:  Procedure Laterality Date  . INGUINAL HERNIA REPAIR    . sinus surgury  1992   dr Haroldine Laws   Social History   Socioeconomic History  . Marital status: Married    Spouse name: Not on file  . Number of children: Not on file  . Years of education: Not on file  . Highest education level: Not on file  Occupational History  . Not on file  Social Needs  . Financial resource strain: Not on file  . Food insecurity:    Worry: Not on file    Inability: Not on file  . Transportation needs:    Medical: Not on file    Non-medical: Not on file  Tobacco Use  . Smoking status: Never Smoker  . Smokeless tobacco: Never Used  Substance and Sexual Activity  . Alcohol use: No  . Drug use: No  . Sexual activity: Not on file  Lifestyle  . Physical activity:    Days per week: Not on file    Minutes per session: Not on file  . Stress: Not on file  Relationships  . Social connections:    Talks on phone: Not on file    Gets together: Not on file    Attends religious service: Not on file    Active member of club or organization: Not on file    Attends meetings of clubs or organizations: Not on file    Relationship status: Not on file  Other Topics Concern  . Not on file  Social History Narrative  . Not on file    Allergies  Allergen Reactions  . Acetaminophen     REACTION: eyes swell  . Aspirin     REACTION: eyes swell  . Ibuprofen     REACTION: eyes swell  . Penicillins     REACTION: pt cant remember   Family History  Problem Relation Age of Onset  . Parkinsonism Unknown   . Prostate cancer Unknown   . Hypertension Unknown   . Colon cancer Neg Hx   . Rectal cancer Neg Hx   . Stomach cancer Neg Hx      Current Outpatient Medications (Cardiovascular):  .  nitroGLYCERIN (NITRODUR - DOSED IN MG/24 HR) 0.2 mg/hr patch, 1/4 patch daily        Past medical history, social, surgical and family history all reviewed in electronic medical record.  No pertanent information unless stated regarding to the chief complaint.   Review of Systems:  No headache, visual changes, nausea, vomiting, diarrhea, constipation, dizziness, abdominal pain, skin rash, fevers, chills, night sweats, weight loss, swollen lymph nodes, body aches, joint swelling, muscle aches, chest pain, shortness of breath, mood changes.   Objective  There were no vitals  taken for this visit. Systems examined below as of    General: No apparent distress alert and oriented x3 mood and affect normal, dressed appropriately.  HEENT: Pupils equal, extraocular movements intact  Respiratory: Patient's speak in full sentences and does not appear short of breath  Cardiovascular: No lower extremity edema, non tender, no erythema  Skin: Warm dry intact with no signs of infection or rash on extremities or on axial skeleton.  Abdomen: Soft nontender  Neuro: Cranial nerves II through XII are intact, neurovascularly intact in all extremities with 2+ DTRs and 2+ pulses.  Lymph: No lymphadenopathy of posterior or anterior cervical chain or axillae bilaterally.  Gait normal with good balance and coordination.  MSK:  Non tender with full range of motion and good stability and symmetric strength and tone of shoulders, elbows, wrist, hip, knee  and ankles bilaterally.  Elbow: Unremarkable to inspection. Range of motion full pronation, supination, flexion, extension. Strength is full to all of the above directions Stable to varus, valgus stress. Negative moving valgus stress test. No discrete areas of tenderness to palpation. Ulnar nerve does not sublux. Negative cubital tunnel Tinel's.  MSK US performed of: *** This study was ordered, performed, and interpreted by Terrilee Files D.O.  Foot/Ankle:   All structures visualized.   Talar dome unremarkable  Ankle mortise without effusion. Peroneus longus and brevis tendons unremarkable on long and transverse views without sheath effusions. Posterior tibialis, flexor hallucis longus, and flexor digitorum longus tendons unremarkable on long and transverse views without sheath effusions. Achilles tendon visualized along length of tendon and unremarkable on long and transverse views without sheath effusion. Anterior Talofibular Ligament and Calcaneofibular Ligaments unremarkable and intact. Deltoid Ligament unremarkable and intact. Plantar fascia intact and without effusion, normal thickness. No increased doppler signal, cap sign, or thickening of tibial cortex. Power doppler signal normal.  IMPRESSION:  NORMAL ULTRASONOGRAPHIC EXAMINATION OF THE FOOT/ANKLE.    Impression and Recommendations:     This case required medical decision making of moderate complexity. The above documentation has been reviewed and is accurate and complete Judi Saa, DO       Note: This dictation was prepared with Dragon dictation along with smaller phrase technology. Any transcriptional errors that result from this process are unintentional.

## 2018-04-10 ENCOUNTER — Ambulatory Visit: Payer: BC Managed Care – PPO | Admitting: Family Medicine

## 2018-07-19 NOTE — Progress Notes (Signed)
Alexander Pierce D.O. [facility] [address] [place], [place] [address] Phone: [phone] Subjective:   I Alexander NighKana Pierce am serving as a Neurosurgeonscribe for Dr. Antoine PrimasZachary Sharanda Shinault.   CC: Finger injury  BJY:[id number]:Subjective  Alexander Pierce is a 68 y.o. male coming in with complaint of right elbow and finger pain. Elbow is doing well. Pulling vines when a thorn stabbed his knuckle. Finger was swollen when he injured it. Redness at the knuckle. Still swollen today. Has shaken about 500 hands recently that has aggravated his finger. ROM is good he just has constant sharp pain.   Onset- Chronic (months ago) Location - Right ring finger Character- dull pain all the time, shaking hands,  Aggravating factors-  Reliving factors-  Therapies tried- stick with wrapping, buddy taping Severity-7 out of 10 and potentially worsening     Past Medical History:  Diagnosis Date  . ALLERGIC RHINITIS 06/29/2007   Qualifier: Diagnosis of  By: Jonny RuizJohn MD, Len BlalockJames W   . Allergy    seasonal  . HYPERLIPIDEMIA 06/29/2007   Qualifier: Diagnosis of  By: Jonny RuizJohn MD, Len BlalockJames W   . Right inguinal hernia 06/24/2011   Past Surgical History:  Procedure Laterality Date  . INGUINAL HERNIA REPAIR    . sinus surgury  1992   dr Haroldine [place]   Social History   Socioeconomic History  . Marital status: Married    Spouse name: Not on file  . Number of children: Not on file  . Years of education: Not on file  . Highest education level: Not on file  Occupational History  . Not on file  Social Needs  . Financial resource strain: Not on file  . Food insecurity    Worry: Not on file    Inability: Not on file  . Transportation needs    Medical: Not on file    Non-medical: Not on file  Tobacco Use  . Smoking status: Never Smoker  . Smokeless tobacco: Never Used  Substance and Sexual Activity  . Alcohol use: No  . Drug use: No  . Sexual activity: Not on file  Lifestyle  . Physical activity    Days per week: Not on file    Minutes  per session: Not on file  . Stress: Not on file  Relationships  . Social Musicianconnections    Talks on phone: Not on file    Gets together: Not on file    Attends religious service: Not on file    Active member of club or organization: Not on file    Attends meetings of clubs or organizations: Not on file    Relationship status: Not on file  Other Topics Concern  . Not on file  Social History Narrative  . Not on file   Allergies  Allergen Reactions  . Acetaminophen     REACTION: eyes swell  . Aspirin     REACTION: eyes swell  . Ibuprofen     REACTION: eyes swell  . Penicillins     REACTION: pt cant remember   Family History  Problem Relation Age of Onset  . Parkinsonism Unknown   . Prostate cancer Unknown   . Hypertension Unknown   . Colon cancer Neg Hx   . Rectal cancer Neg Hx   . Stomach cancer Neg Hx          Current Outpatient Medications (Other):  .  doxycycline (VIBRA-TABS) 100 MG tablet, Take 1 tablet (100 mg total) by mouth 2 (two) times  daily.    Past medical history, social, surgical and family history all reviewed in electronic medical record.  No pertanent information unless stated regarding to the chief complaint.   Review of Systems:  No headache, visual changes, nausea, vomiting, diarrhea, constipation, dizziness, abdominal pain, skin rash, fevers, chills, night sweats, weight loss, swollen lymph nodes, body aches, joint swelling,  chest pain, shortness of breath, mood changes.  Positive muscle aches  Objective  Blood pressure (!) 150/80, pulse 74, height 6\' 2"  (1.88 m), weight 211 lb (95.7 kg), SpO2 97 %.   General: No apparent distress alert and oriented x3 mood and affect normal, dressed appropriately.  HEENT: Pupils equal, extraocular movements intact  Respiratory: Patient's speak in full sentences and does not appear short of breath  Cardiovascular: No lower extremity edema, non tender, no erythema  Skin: Warm dry intact with no signs of  infection or rash on extremities or on axial skeleton.  Abdomen: Soft nontender  Neuro: Cranial nerves II through XII are intact, neurovascularly intact in all extremities with 2+ DTRs and 2+ pulses.  Lymph: No lymphadenopathy of posterior or anterior cervical chain or axillae bilaterally.  Gait normal with good balance and coordination.  MSK:  Non tender with full range of motion and good stability and symmetric strength and tone of shoulders, , wrist, hip, knee and ankles bilaterally.  Right elbow exam shows no significant improvement.  Near full range of motion.Patient has very minimal tenderness over the lateral epicondylar region.    Right fourth finger shows significant swelling around the PIP joint.  Significant erythema noticing warmness.  Patient does have full range of motion of the finger.  Limited musculoskeletal ultrasound was performed and interpreted by Lyndal Pulley  Limited ultrasound shows patient has significant soft tissue swelling that is consistent with possible abscess formation with severe increase in Doppler flow Very small possible metallic loose body noted on the inferior aspect of the PIP joint.  Very difficult to assess though. Impression: Soft tissue swelling consistent with abscess formation    Impression and Recommendations:     This case required medical decision making of moderate complexity. The above documentation has been reviewed and is accurate and complete Lyndal Pulley, DO       Note: This dictation was prepared with Dragon dictation along with smaller phrase technology. Any transcriptional errors that result from this process are unintentional.

## 2018-07-20 ENCOUNTER — Ambulatory Visit: Payer: Self-pay

## 2018-07-20 ENCOUNTER — Ambulatory Visit (INDEPENDENT_AMBULATORY_CARE_PROVIDER_SITE_OTHER)
Admission: RE | Admit: 2018-07-20 | Discharge: 2018-07-20 | Disposition: A | Discharge status: Home / Self Care | Payer: BC Managed Care – PPO | Source: Ambulatory Visit | Attending: Family Medicine | Admitting: Family Medicine

## 2018-07-20 ENCOUNTER — Other Ambulatory Visit: Payer: Self-pay

## 2018-07-20 ENCOUNTER — Ambulatory Visit: Payer: BC Managed Care – PPO | Admitting: Family Medicine

## 2018-07-20 ENCOUNTER — Encounter: Payer: Self-pay | Admitting: Family Medicine

## 2018-07-20 VITALS — BP 150/80 | HR 74 | Ht 74.0 in | Wt 211.0 lb

## 2018-07-20 DIAGNOSIS — L089 Local infection of the skin and subcutaneous tissue, unspecified: Secondary | ICD-10-CM

## 2018-07-20 DIAGNOSIS — M7711 Lateral epicondylitis, right elbow: Secondary | ICD-10-CM | POA: Diagnosis not present

## 2018-07-20 DIAGNOSIS — M79644 Pain in right finger(s): Secondary | ICD-10-CM | POA: Diagnosis not present

## 2018-07-20 DIAGNOSIS — Z23 Encounter for immunization: Secondary | ICD-10-CM | POA: Diagnosis not present

## 2018-07-20 IMAGING — DX RIGHT RING FINGER 2+V
3 series · 3 of 3 positions shown · non-contrast
Comparison: None.

CLINICAL DATA: Puncture injury to the fourth digit two months ago
with persistent pain and swelling

EXAM:
RIGHT RING FINGER 2+V

[finger ap]
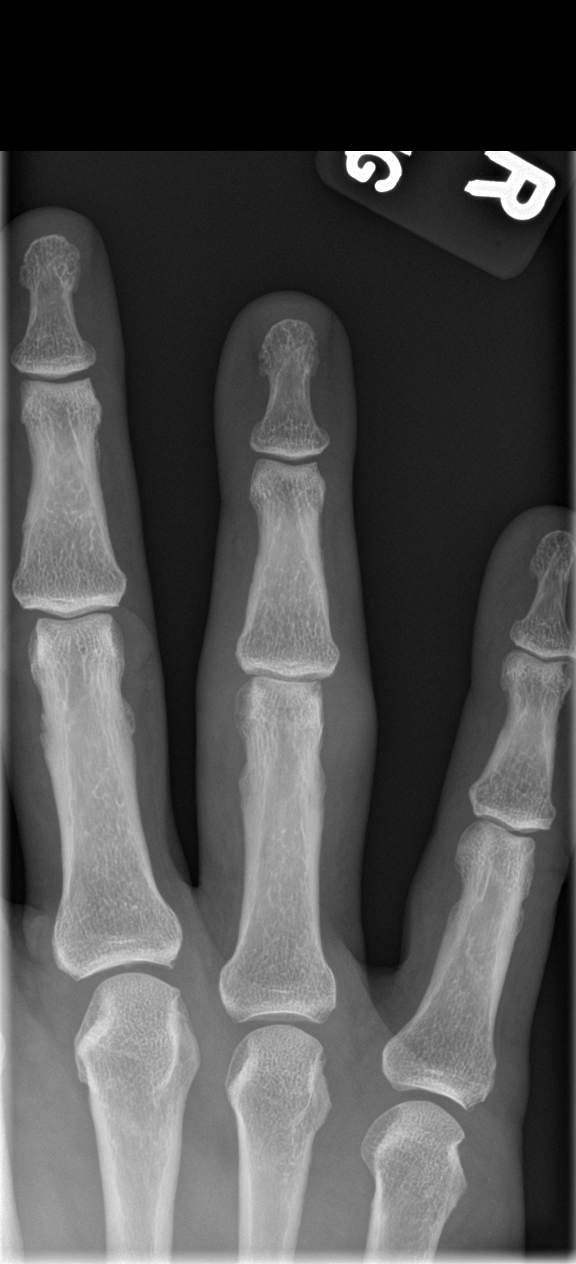

[finger obl]
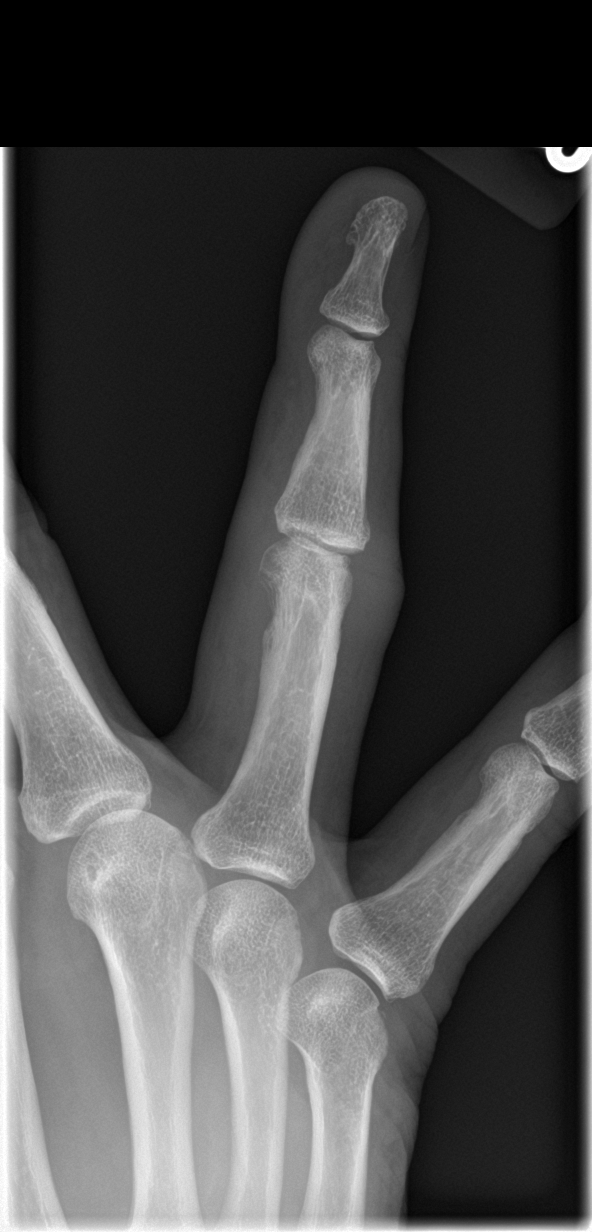

[finger lat]
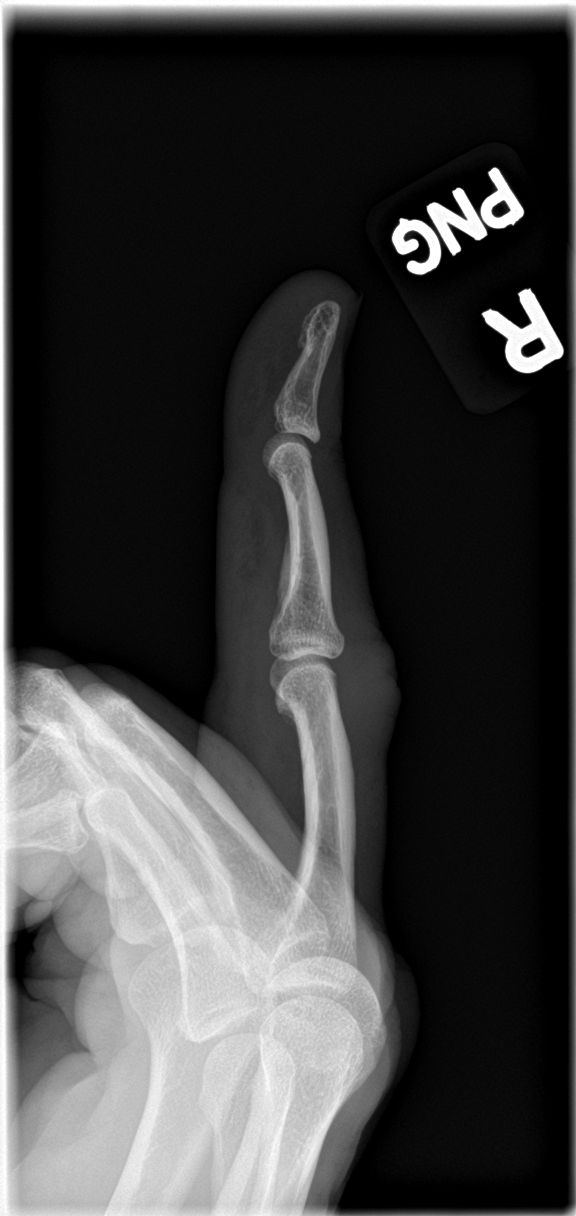

[3 of 3 positions shown; findings below may reference images not displayed]

FINDINGS: Soft tissue swelling is noted about the PIP joint. No definitive
bony erosion or acute fracture is seen. No radiopaque foreign body
is seen.
IMPRESSION: Soft tissue swelling consistent with the recent injury. No bony
abnormality or radiopaque foreign body is seen.

## 2018-07-20 MED ORDER — TETANUS-DIPHTHERIA TOXOIDS TD 5-2 LFU IM INJ: 0.5000 mL | INJECTION | Freq: Once | INTRAMUSCULAR | Status: DC

## 2018-07-20 MED ORDER — DOXYCYCLINE HYCLATE 100 MG PO TABS: 100.0000 mg | ORAL_TABLET | Freq: Two times a day (BID) | ORAL | 0 refills | Status: DC

## 2018-07-20 NOTE — Assessment & Plan Note (Signed)
Improvement noted today.  Continue conservative therapy.  Lower priority until we see the finger healing

## 2018-07-20 NOTE — Patient Instructions (Signed)
Good to see you Follow up 10-14 days

## 2018-07-20 NOTE — Assessment & Plan Note (Signed)
Patient did have more of a finger infection.  Seems to be abscess.  X-rays were independently visualized by me with maybe some mild osteomyelitis.  Discussed with patient in great length.  Started on doxycycline, icing regimen, has been 10 years since his Tdap so this was given today as well.  Patient knows if any streaking of redness he needs to seek medical attention immediately.  Patient is in agreement with the plan and may follow-up with me again in within 2 weeks if continuing to have redness we will need to consider the possibility of sending patient to hand surgeon.

## 2018-08-04 ENCOUNTER — Other Ambulatory Visit: Payer: Self-pay

## 2018-08-04 ENCOUNTER — Ambulatory Visit (INDEPENDENT_AMBULATORY_CARE_PROVIDER_SITE_OTHER): Payer: BC Managed Care – PPO | Admitting: Family Medicine

## 2018-08-04 ENCOUNTER — Ambulatory Visit: Payer: Self-pay

## 2018-08-04 ENCOUNTER — Encounter: Payer: Self-pay | Admitting: Family Medicine

## 2018-08-04 VITALS — BP 130/82 | HR 96 | Ht 74.0 in | Wt 211.2 lb

## 2018-08-04 DIAGNOSIS — M79641 Pain in right hand: Secondary | ICD-10-CM

## 2018-08-04 DIAGNOSIS — L089 Local infection of the skin and subcutaneous tissue, unspecified: Secondary | ICD-10-CM

## 2018-08-04 MED ORDER — DOXYCYCLINE HYCLATE 100 MG PO TABS: 100.0000 mg | ORAL_TABLET | Freq: Two times a day (BID) | ORAL | 0 refills | Status: DC

## 2018-08-04 NOTE — Patient Instructions (Signed)
See me again in 3 weeks 

## 2018-08-04 NOTE — Progress Notes (Signed)
Tawana ScaleZach  D.O. [facility] [address] [place], [place] [address] Phone: [phone] Subjective:   I Alexander Pierce am serving as a Neurosurgeonscribe for Dr. Antoine PrimasZachary .   CC: Finger pain and swelling follow-up  BJY:[id number]:Subjective   07/20/2018: Patient did have more of a finger infection.  Seems to be abscess.  X-rays were independently visualized by me with maybe some mild osteomyelitis.  Discussed with patient in great length.  Started on doxycycline, icing regimen, has been 10 years since his Tdap so this was given today as well.  Patient knows if any streaking of redness he needs to seek medical attention immediately.  Patient is in agreement with the plan and may follow-up with me again in within 2 weeks if continuing to have redness we will need to consider the possibility of sending patient to hand surgeon.  Update 08/04/2018: Alexander Pierce is a 68 y.o. male coming in with complaint of finger pain of right hand. States he is making some improvements. Some soreness in the finger. Patient has had some pain and swelling for some time.  Patient states that it is improving slowly but never did get the antibiotic.  More of a soreness now.     Past Medical History:  Diagnosis Date  . ALLERGIC RHINITIS 06/29/2007   Qualifier: Diagnosis of  By: Jonny RuizJohn MD, Len BlalockJames W   . Allergy    seasonal  . HYPERLIPIDEMIA 06/29/2007   Qualifier: Diagnosis of  By: Jonny RuizJohn MD, Len BlalockJames W   . Right inguinal hernia 06/24/2011   Past Surgical History:  Procedure Laterality Date  . INGUINAL HERNIA REPAIR    . sinus surgury  1992   dr Haroldine Lawscrossley   Social History   Socioeconomic History  . Marital status: Married    Spouse name: Not on file  . Number of children: Not on file  . Years of education: Not on file  . Highest education level: Not on file  Occupational History  . Not on file  Social Needs  . Financial resource strain: Not on file  . Food insecurity    Worry: Not on file    Inability: Not on file   . Transportation needs    Medical: Not on file    Non-medical: Not on file  Tobacco Use  . Smoking status: Never Smoker  . Smokeless tobacco: Never Used  Substance and Sexual Activity  . Alcohol use: No  . Drug use: No  . Sexual activity: Not on file  Lifestyle  . Physical activity    Days per week: Not on file    Minutes per session: Not on file  . Stress: Not on file  Relationships  . Social Musicianconnections    Talks on phone: Not on file    Gets together: Not on file    Attends religious service: Not on file    Active member of club or organization: Not on file    Attends meetings of clubs or organizations: Not on file    Relationship status: Not on file  Other Topics Concern  . Not on file  Social History Narrative  . Not on file   Allergies  Allergen Reactions  . Acetaminophen     REACTION: eyes swell  . Aspirin     REACTION: eyes swell  . Ibuprofen     REACTION: eyes swell  . Penicillins     REACTION: pt cant remember   Family History  Problem Relation Age of Onset  .  Parkinsonism Unknown   . Prostate cancer Unknown   . Hypertension Unknown   . Colon cancer Neg Hx   . Rectal cancer Neg Hx   . Stomach cancer Neg Hx          Current Outpatient Medications (Other):  .  doxycycline (VIBRA-TABS) 100 MG tablet, Take 1 tablet (100 mg total) by mouth 2 (two) times daily.    Past medical history, social, surgical and family history all reviewed in electronic medical record.  No pertanent information unless stated regarding to the chief complaint.   Review of Systems:  No headache, visual changes, nausea, vomiting, diarrhea, constipation, dizziness, abdominal pain, skin rash, fevers, chills, night sweats, weight loss, swollen lymph nodes, body aches, joint swelling, muscle aches, chest pain, shortness of breath, mood changes.   Objective  Blood pressure 130/82, pulse 96, height 6\' 2"  (1.88 m), weight 211 lb 3.2 oz (95.8 kg), SpO2 (!) 71 %.     General: No  apparent distress alert and oriented x3 mood and affect normal, dressed appropriately.  HEENT: Pupils equal, extraocular movements intact  Respiratory: Patient's speak in full sentences and does not appear short of breath  Cardiovascular: No lower extremity edema, non tender, no erythema  Skin: Warm dry intact with no signs of infection or rash on extremities or on axial skeleton.  Abdomen: Soft nontender  Neuro: Cranial nerves II through XII are intact, neurovascularly intact in all extremities with 2+ DTRs and 2+ pulses.  Lymph: No lymphadenopathy of posterior or anterior cervical chain or axillae bilaterally.  Gait normal with good balance and coordination.  MSK:  Non tender with full range of motion and good stability and symmetric strength and tone of shoulders, elbows, wrist, hip, knee and ankles bilaterally.  Mild arthritic changes of multiple joints Right ring finger show significant was soft tissue swelling previous exam.  Still mild warmth to touch.  Still limited to the last 5 degrees of flexion of the PIP joint  Limited musculoskeletal ultrasound was performed and interpreted by Lyndal Pulley  Limited ultrasound still shows the patient has significant soft tissue swelling with hypoechoic changes as well as some increasing Doppler flow.  Still no loose bodies noted.  Patient does have some mild changes of the very erosive changes of the bone still noted.     Impression and Recommendations:     This case required medical decision making of moderate complexity. The above documentation has been reviewed and is accurate and complete Lyndal Pulley, DO       Note: This dictation was prepared with Dragon dictation along with smaller phrase technology. Any transcriptional errors that result from this process are unintentional.

## 2018-08-04 NOTE — Assessment & Plan Note (Signed)
Patient has been improving but continues to have discomfort and pain though.  Patient never took the antibiotic.  Encouraged patient to continue to stay fairly active and monitor for any evidence of redness or erythema.  X-rays did not show any foreign body but possible advanced imaging would be warranted.  Ultrasound today did not show any infectious etiology either.  Follow-up again in 2 to 3 weeks.

## 2018-09-07 ENCOUNTER — Ambulatory Visit: Payer: BC Managed Care – PPO | Admitting: Family Medicine

## 2018-09-15 ENCOUNTER — Ambulatory Visit: Payer: BC Managed Care – PPO | Admitting: Family Medicine

## 2018-09-15 ENCOUNTER — Encounter: Payer: Self-pay | Admitting: Family Medicine

## 2018-09-15 ENCOUNTER — Other Ambulatory Visit: Payer: Self-pay

## 2018-09-15 VITALS — BP 148/70 | HR 68 | Ht 74.0 in | Wt 213.0 lb

## 2018-09-15 DIAGNOSIS — L089 Local infection of the skin and subcutaneous tissue, unspecified: Secondary | ICD-10-CM

## 2018-09-15 NOTE — Progress Notes (Signed)
[facility] [place], [place] [address] Phone: [phone] Subjective:   I Alexander Pierce am serving as a Education administrator for Dr. Hulan Saas.    CC: Hand pain follow-up  DVV:[id number]   08/04/2018 Patient has been improving but continues to have discomfort and pain though.  Patient never took the antibiotic.  Encouraged patient to continue to stay fairly active and monitor for any evidence of redness or erythema.  X-rays did not show any foreign body but possible advanced imaging would be warranted.  Ultrasound today did not show any infectious etiology either.  Follow-up again in 2 to 3 weeks.  Update 09/15/2018 Alexander Pierce is a 68 y.o. male coming in with complaint of right hand pain. States he is feeling a lot better. Small amount of soreness.  Patient states that has full range of motion now.  Has noticed that when he shakes and has some difficulty.      Past Medical History:  Diagnosis Date  . ALLERGIC RHINITIS 06/29/2007   Qualifier: Diagnosis of  By: Jenny Reichmann MD, Hunt Oris   . Allergy    seasonal  . HYPERLIPIDEMIA 06/29/2007   Qualifier: Diagnosis of  By: Jenny Reichmann MD, Hunt Oris   . Right inguinal hernia 06/24/2011   Past Surgical History:  Procedure Laterality Date  . INGUINAL HERNIA REPAIR    . sinus surgury  1992   dr Ernesto Rutherford   Social History   Socioeconomic History  . Marital status: Married    Spouse name: Not on file  . Number of children: Not on file  . Years of education: Not on file  . Highest education level: Not on file  Occupational History  . Not on file  Social Needs  . Financial resource strain: Not on file  . Food insecurity    Worry: Not on file    Inability: Not on file  . Transportation needs    Medical: Not on file    Non-medical: Not on file  Tobacco Use  . Smoking status: Never Smoker  . Smokeless tobacco: Never Used  Substance and Sexual Activity  . Alcohol use: No  . Drug use: No  . Sexual activity: Not on  file  Lifestyle  . Physical activity    Days per week: Not on file    Minutes per session: Not on file  . Stress: Not on file  Relationships  . Social Herbalist on phone: Not on file    Gets together: Not on file    Attends religious service: Not on file    Active member of club or organization: Not on file    Attends meetings of clubs or organizations: Not on file    Relationship status: Not on file  Other Topics Concern  . Not on file  Social History Narrative  . Not on file   Allergies  Allergen Reactions  . Acetaminophen     REACTION: eyes swell  . Aspirin     REACTION: eyes swell  . Ibuprofen     REACTION: eyes swell  . Penicillins     REACTION: pt cant remember   Family History  Problem Relation Age of Onset  . Parkinsonism Unknown   . Prostate cancer Unknown   . Hypertension Unknown   . Colon cancer Neg Hx   . Rectal cancer Neg Hx   . Stomach cancer Neg Hx          Current Outpatient  Medications (Other):  .  doxycycline (VIBRA-TABS) 100 MG tablet, Take 1 tablet (100 mg total) by mouth 2 (two) times daily.    Past medical history, social, surgical and family history all reviewed in electronic medical record.  No pertanent information unless stated regarding to the chief complaint.   Review of Systems:  No headache, visual changes, nausea, vomiting, diarrhea, constipation, dizziness, abdominal pain, skin rash, fevers, chills, night sweats, weight loss, swollen lymph nodes, body aches, joint swelling,  chest pain, shortness of breath, mood changes.  Positive muscle aches  Objective  Blood pressure (!) 148/70, pulse 68, height 6\' 2"  (1.88 m), weight 213 lb (96.6 kg), SpO2 93 %.    General: No apparent distress alert and oriented x3 mood and affect normal, dressed appropriately.  HEENT: Pupils equal, extraocular movements intact  Respiratory: Patient's speak in full sentences and does not appear short of breath  Cardiovascular: No lower  extremity edema, non tender, no erythema  Skin: Warm dry intact with no signs of infection or rash on extremities or on axial skeleton.  Abdomen: Soft nontender  Neuro: Cranial nerves II through XII are intact, neurovascularly intact in all extremities with 2+ DTRs and 2+ pulses.  Lymph: No lymphadenopathy of posterior or anterior cervical chain or axillae bilaterally.  Gait normal with good balance and coordination.  MSK:  tender with full range of motion and good stability and symmetric strength and tone of shoulders, elbows, wrist, hip, knee and ankles bilaterally.  Mild arthritis Right hand exam shows the patient's fourth finger does have full range of motion now at this time.  Minimal discomfort with palpation over the PIP but otherwise unremarkable.   Impression and Recommendations:      The above documentation has been reviewed and is accurate and complete Judi SaaZachary M Smith, DO       Note: This dictation was prepared with Dragon dictation along with smaller phrase technology. Any transcriptional errors that result from this process are unintentional.

## 2018-09-15 NOTE — Patient Instructions (Signed)
Good.

## 2018-11-23 ENCOUNTER — Encounter: Payer: BC Managed Care – PPO | Admitting: Internal Medicine

## 2018-11-24 ENCOUNTER — Other Ambulatory Visit (INDEPENDENT_AMBULATORY_CARE_PROVIDER_SITE_OTHER): Payer: BC Managed Care – PPO

## 2018-11-24 ENCOUNTER — Encounter: Payer: Self-pay | Admitting: Internal Medicine

## 2018-11-24 ENCOUNTER — Other Ambulatory Visit: Payer: Self-pay

## 2018-11-24 ENCOUNTER — Other Ambulatory Visit: Payer: Self-pay | Admitting: Internal Medicine

## 2018-11-24 ENCOUNTER — Ambulatory Visit (INDEPENDENT_AMBULATORY_CARE_PROVIDER_SITE_OTHER): Payer: BC Managed Care – PPO | Admitting: Internal Medicine

## 2018-11-24 VITALS — BP 124/82 | HR 76 | Temp 97.7°F | Ht 74.0 in | Wt 213.0 lb

## 2018-11-24 DIAGNOSIS — E559 Vitamin D deficiency, unspecified: Secondary | ICD-10-CM

## 2018-11-24 DIAGNOSIS — Z125 Encounter for screening for malignant neoplasm of prostate: Secondary | ICD-10-CM

## 2018-11-24 DIAGNOSIS — Z0001 Encounter for general adult medical examination with abnormal findings: Secondary | ICD-10-CM | POA: Diagnosis not present

## 2018-11-24 DIAGNOSIS — E785 Hyperlipidemia, unspecified: Secondary | ICD-10-CM

## 2018-11-24 DIAGNOSIS — Z0000 Encounter for general adult medical examination without abnormal findings: Secondary | ICD-10-CM

## 2018-11-24 DIAGNOSIS — E538 Deficiency of other specified B group vitamins: Secondary | ICD-10-CM

## 2018-11-24 DIAGNOSIS — J309 Allergic rhinitis, unspecified: Secondary | ICD-10-CM

## 2018-11-24 DIAGNOSIS — E611 Iron deficiency: Secondary | ICD-10-CM | POA: Diagnosis not present

## 2018-11-24 DIAGNOSIS — R972 Elevated prostate specific antigen [PSA]: Secondary | ICD-10-CM

## 2018-11-24 LAB — IBC PANEL
Iron: 137 ug/dL (ref 42–165)
Saturation Ratios: 40.9 % (ref 20.0–50.0)
Transferrin: 239 mg/dL (ref 212.0–360.0)

## 2018-11-24 LAB — URINALYSIS, ROUTINE W REFLEX MICROSCOPIC
Bilirubin Urine: NEGATIVE
Hgb urine dipstick: NEGATIVE
Ketones, ur: NEGATIVE
Leukocytes,Ua: NEGATIVE
Nitrite: NEGATIVE
Specific Gravity, Urine: 1.01 (ref 1.000–1.030)
Total Protein, Urine: NEGATIVE
Urine Glucose: NEGATIVE
Urobilinogen, UA: 0.2 (ref 0.0–1.0)
pH: 6.5 (ref 5.0–8.0)

## 2018-11-24 LAB — CBC WITH DIFFERENTIAL/PLATELET
Basophils Absolute: 0 10*3/uL (ref 0.0–0.1)
Basophils Relative: 0.7 % (ref 0.0–3.0)
Eosinophils Absolute: 0.6 10*3/uL (ref 0.0–0.7)
Eosinophils Relative: 9.8 % — ABNORMAL HIGH (ref 0.0–5.0)
HCT: 43.9 % (ref 39.0–52.0)
Hemoglobin: 15 g/dL (ref 13.0–17.0)
Lymphocytes Relative: 26.2 % (ref 12.0–46.0)
Lymphs Abs: 1.7 10*3/uL (ref 0.7–4.0)
MCHC: 34.2 g/dL (ref 30.0–36.0)
MCV: 89.9 fl (ref 78.0–100.0)
Monocytes Absolute: 0.6 10*3/uL (ref 0.1–1.0)
Monocytes Relative: 9.4 % (ref 3.0–12.0)
Neutro Abs: 3.5 10*3/uL (ref 1.4–7.7)
Neutrophils Relative %: 53.9 % (ref 43.0–77.0)
Platelets: 277 10*3/uL (ref 150.0–400.0)
RBC: 4.88 Mil/uL (ref 4.22–5.81)
RDW: 14 % (ref 11.5–15.5)
WBC: 6.6 10*3/uL (ref 4.0–10.5)

## 2018-11-24 LAB — HEPATIC FUNCTION PANEL
ALT: 20 U/L (ref 0–53)
AST: 19 U/L (ref 0–37)
Albumin: 4.3 g/dL (ref 3.5–5.2)
Alkaline Phosphatase: 70 U/L (ref 39–117)
Bilirubin, Direct: 0.1 mg/dL (ref 0.0–0.3)
Total Bilirubin: 0.7 mg/dL (ref 0.2–1.2)
Total Protein: 7 g/dL (ref 6.0–8.3)

## 2018-11-24 LAB — LIPID PANEL
Cholesterol: 201 mg/dL — ABNORMAL HIGH (ref 0–200)
HDL: 39.7 mg/dL (ref >39.00)
LDL Cholesterol: 142 mg/dL — ABNORMAL HIGH (ref 0–99)
NonHDL: 161.69
Total CHOL/HDL Ratio: 5
Triglycerides: 96 mg/dL (ref 0.0–149.0)
VLDL: 19.2 mg/dL (ref 0.0–40.0)

## 2018-11-24 LAB — BASIC METABOLIC PANEL
BUN: 14 mg/dL (ref 6–23)
CO2: 28 mEq/L (ref 19–32)
Calcium: 8.8 mg/dL (ref 8.4–10.5)
Chloride: 99 mEq/L (ref 96–112)
Creatinine, Ser: 0.96 mg/dL (ref 0.40–1.50)
GFR: 77.4 mL/min (ref >60.00)
Glucose, Bld: 95 mg/dL (ref 70–99)
Potassium: 4.2 mEq/L (ref 3.5–5.1)
Sodium: 134 mEq/L — ABNORMAL LOW (ref 135–145)

## 2018-11-24 LAB — TSH: TSH: 1.01 u[IU]/mL (ref 0.35–4.50)

## 2018-11-24 LAB — VITAMIN D 25 HYDROXY (VIT D DEFICIENCY, FRACTURES): VITD: 26.16 ng/mL — ABNORMAL LOW (ref 30.00–100.00)

## 2018-11-24 LAB — VITAMIN B12: Vitamin B-12: 345 pg/mL (ref 211–911)

## 2018-11-24 LAB — PSA: PSA: 5.65 ng/mL — ABNORMAL HIGH (ref 0.10–4.00)

## 2018-11-24 MED ORDER — ATORVASTATIN CALCIUM 20 MG PO TABS: 20.0000 mg | ORAL_TABLET | Freq: Every day | ORAL | 3 refills | Status: DC

## 2018-11-24 MED ORDER — VITAMIN D (ERGOCALCIFEROL) 1.25 MG (50000 UNIT) PO CAPS: 50000.0000 [IU] | ORAL_CAPSULE | ORAL | 0 refills | Status: DC

## 2018-11-24 NOTE — Progress Notes (Signed)
Subjective:    Patient ID: Alexander Pierce, male    DOB: 11-Apr-1950, 68 y.o.   MRN: [id number]  HPI  Here for wellness and f/u;  Overall doing ok;  Pt denies Chest pain, worsening SOB, DOE, wheezing, orthopnea, PND, worsening LE edema, palpitations, dizziness or syncope.  Pt denies neurological change such as new headache, facial or extremity weakness.  Pt denies polydipsia, polyuria, or low sugar symptoms. Pt states overall good compliance with treatment and medications, good tolerability, and has been trying to follow appropriate diet.  Pt denies worsening depressive symptoms, suicidal ideation or panic. No fever, night sweats, wt loss, loss of appetite, or other constitutional symptoms.  Pt states good ability with ADL's, has low fall risk, home safety reviewed and adequate, no other significant changes in hearing or vision, and only occasionally active with exercise. 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  Also, has hx of elev psa, did not see urology, Denies urinary symptoms such as dysuria, frequency, urgency, flank pain, hematuria or n/v, fever, chills. Also, Does have several wks ongoing nasal allergy symptoms with clearish congestion, itch and sneezing, without fever, pain, ST, cough, swelling or wheezing. Past Medical History:  Diagnosis Date  . ALLERGIC RHINITIS 06/29/2007   Qualifier: Diagnosis of  By: Jenny Reichmann MD, Hunt Oris   . Allergy    seasonal  . HYPERLIPIDEMIA 06/29/2007   Qualifier: Diagnosis of  By: Jenny Reichmann MD, Hunt Oris   . Right inguinal hernia 06/24/2011   Past Surgical History:  Procedure Laterality Date  . INGUINAL HERNIA REPAIR    . sinus surgury  1992   dr Ernesto Rutherford    reports that he has never smoked. He has never used smokeless tobacco. He reports that he does not drink alcohol or use drugs. family history includes Hypertension in his unknown relative; Parkinsonism in his unknown relative; Prostate cancer in his unknown relative. Allergies   Allergen Reactions  . Acetaminophen     REACTION: eyes swell  . Aspirin     REACTION: eyes swell  . Ibuprofen     REACTION: eyes swell  . Penicillins     REACTION: pt cant remember   No current outpatient medications on file prior to visit.   No current facility-administered medications on file prior to visit.    Review of Systems Constitutional: Negative for other unusual diaphoresis, sweats, appetite or weight changes HENT: Negative for other worsening hearing loss, ear pain, facial swelling, mouth sores or neck stiffness.   Eyes: Negative for other worsening pain, redness or other visual disturbance.  Respiratory: Negative for other stridor or swelling Cardiovascular: Negative for other palpitations or other chest pain  Gastrointestinal: Negative for worsening diarrhea or loose stools, blood in stool, distention or other pain Genitourinary: Negative for hematuria, flank pain or other change in urine volume.  Musculoskeletal: Negative for myalgias or other joint swelling.  Skin: Negative for other color change, or other wound or worsening drainage.  Neurological: Negative for other syncope or numbness. Hematological: Negative for other adenopathy or swelling Psychiatric/Behavioral: Negative for hallucinations, other worsening agitation, SI, self-injury, or new decreased concentration All otherwise neg per pt    Objective:   Physical Exam BP 124/82   Pulse 76   Temp 97.7 F (36.5 C) (Oral)   Ht 6\' 2"  (1.88 m)   Wt 213 lb (96.6 kg)   SpO2 97%   BMI 27.35 kg/m  VS noted,  Constitutional: Pt is oriented to person,  place, and time. Appears well-developed and well-nourished, in no significant distress and comfortable Head: Normocephalic and atraumatic  Eyes: Conjunctivae and EOM are normal. Pupils are equal, round, and reactive to light Right Ear: External ear normal without discharge Left Ear: External ear normal without discharge Nose: Nose without discharge or deformity  Mouth/Throat: Oropharynx is without other ulcerations and moist  Neck: Normal range of motion. Neck supple. No JVD present. No tracheal deviation present or significant neck LA or mass Cardiovascular: Normal rate, regular rhythm, normal heart sounds and intact distal pulses.   Pulmonary/Chest: WOB normal and breath sounds without rales or wheezing  Abdominal: Soft. Bowel sounds are normal. NT. No HSM  Musculoskeletal: Normal range of motion. Exhibits no edema Lymphadenopathy: Has no other cervical adenopathy.  Neurological: Pt is alert and oriented to person, place, and time. Pt has normal reflexes. No cranial nerve deficit. Motor grossly intact, Gait intact Skin: Skin is warm and dry. No rash noted or new ulcerations Psychiatric:  Has normal mood and affect. Behavior is normal without agitation All otherwise neg per pt Lab Results  Component Value Date   WBC 6.6 11/24/2018   HGB 15.0 11/24/2018   HCT 43.9 11/24/2018   PLT 277.0 11/24/2018   GLUCOSE 95 11/24/2018   CHOL 201 (H) 11/24/2018   TRIG 96.0 11/24/2018   HDL 39.70 11/24/2018   LDLCALC 142 (H) 11/24/2018   ALT 20 11/24/2018   AST 19 11/24/2018   NA 134 (L) 11/24/2018   K 4.2 11/24/2018   CL 99 11/24/2018   CREATININE 0.96 11/24/2018   BUN 14 11/24/2018   CO2 28 11/24/2018   TSH 1.01 11/24/2018   PSA 5.65 (H) 11/24/2018      Assessment & Plan:

## 2018-11-24 NOTE — Patient Instructions (Signed)

## 2018-11-25 ENCOUNTER — Encounter: Payer: Self-pay | Admitting: Internal Medicine

## 2018-11-25 NOTE — Assessment & Plan Note (Signed)
For f/u psa, and consider urology referral,  to f/u any worsening symptoms or concerns  In addition to the time spent performing CPE, I spent an additional 15 minutes face to face,in which greater than 50% of this time was spent in counseling and coordination of care for patient's illness as documented, including the differential dx, treatment, further evaluation and other management of elev psa, HLD, allergic rhinitis

## 2018-11-25 NOTE — Assessment & Plan Note (Signed)
stable overall by history and exam, recent data reviewed with pt, and pt to continue medical treatment as before,  to f/u any worsening symptoms or concerns  

## 2018-12-04 ENCOUNTER — Other Ambulatory Visit: Payer: Self-pay | Admitting: Internal Medicine

## 2018-12-04 NOTE — Telephone Encounter (Signed)
Copied from [place] [phone]. Topic: Quick Communication - Rx Refill/Question >> Dec 04, 2018  3:04 PM Alexander Pierce, [place] A wrote: Medication:atorvastatin (LIPITOR) 20 MG tablet,Vitamin D, Ergocalciferol, (DRISDOL) 1.25 MG (50000 UT) CAPS capsule (Patients states that medication was sen to wrong pharmacy.)  Has the patient contacted their pharmacy? Yes (Agent: If no, request that the patient contact the pharmacy for the refill.) (Agent: If yes, when and what did the pharmacy advise?)Contact PCP  Preferred Pharmacy (with phone number or street name): [facility], [place] - [address] [phone] (Phone) [phone] (Fax)    Agent: Please be advised that RX refills may take up to 3 business days. We ask that you follow-up with your pharmacy.

## 2019-02-21 ENCOUNTER — Ambulatory Visit: Payer: BC Managed Care – PPO | Admitting: Family Medicine

## 2019-02-21 ENCOUNTER — Other Ambulatory Visit: Payer: Self-pay

## 2019-02-21 ENCOUNTER — Ambulatory Visit (INDEPENDENT_AMBULATORY_CARE_PROVIDER_SITE_OTHER): Payer: BC Managed Care – PPO

## 2019-02-21 ENCOUNTER — Encounter: Payer: Self-pay | Admitting: Family Medicine

## 2019-02-21 VITALS — BP 140/90 | HR 74 | Ht 74.0 in | Wt 215.0 lb

## 2019-02-21 DIAGNOSIS — M170 Bilateral primary osteoarthritis of knee: Secondary | ICD-10-CM | POA: Diagnosis not present

## 2019-02-21 DIAGNOSIS — G8929 Other chronic pain: Secondary | ICD-10-CM | POA: Diagnosis not present

## 2019-02-21 DIAGNOSIS — M25562 Pain in left knee: Secondary | ICD-10-CM

## 2019-02-21 IMAGING — DX DG KNEE COMPLETE 4+V*L*
4 series · 4 of 4 positions shown · non-contrast
Comparison: [DATE].

CLINICAL DATA: Left knee pain.

EXAM:
LEFT KNEE - COMPLETE 4+ VIEW

[knee ap]
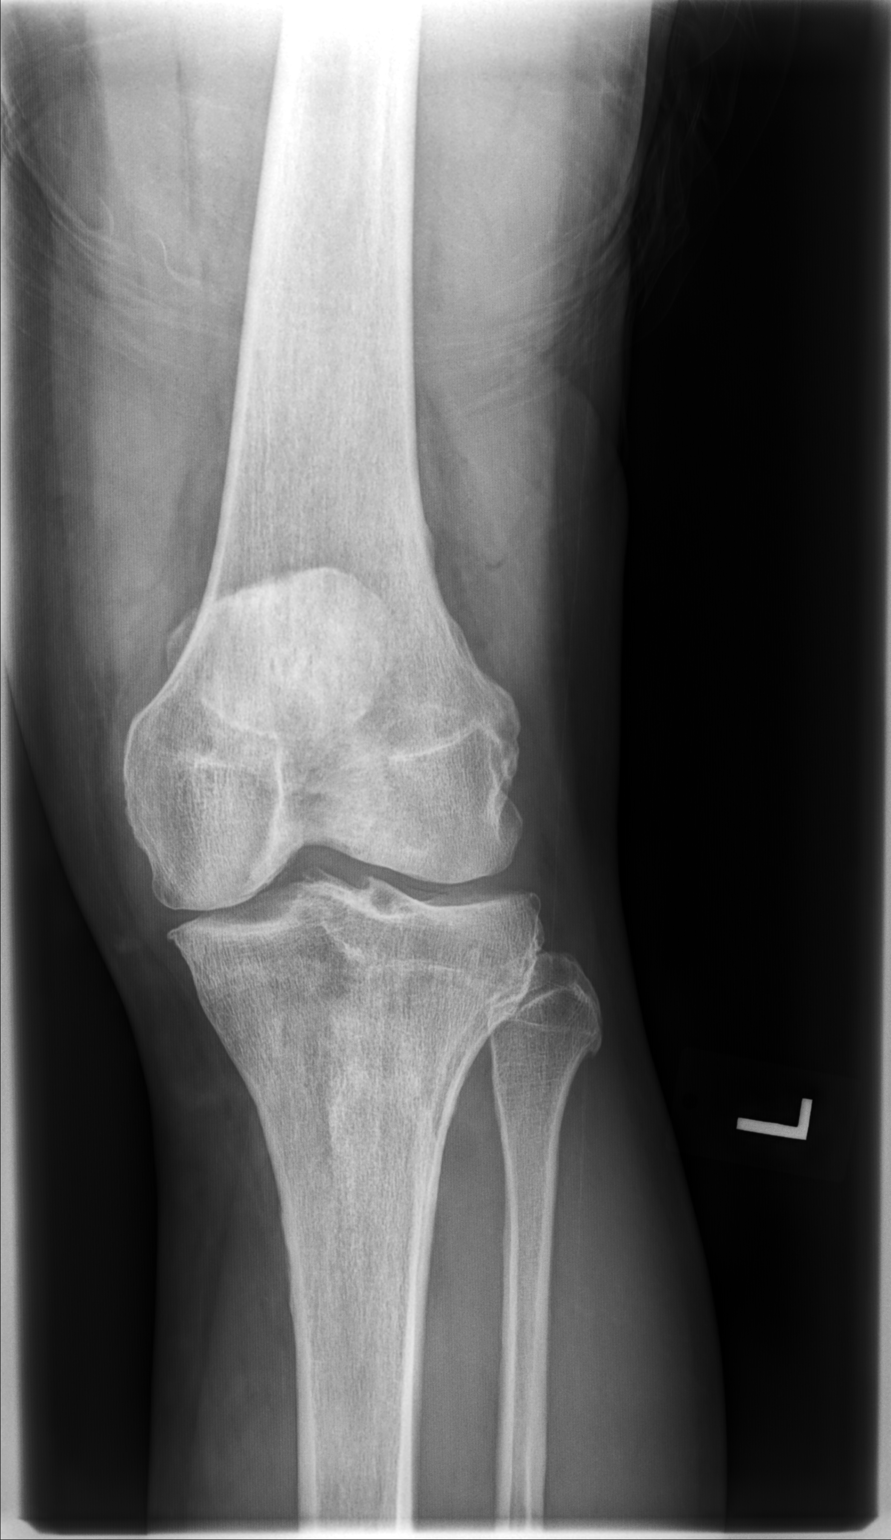

[knee lat]
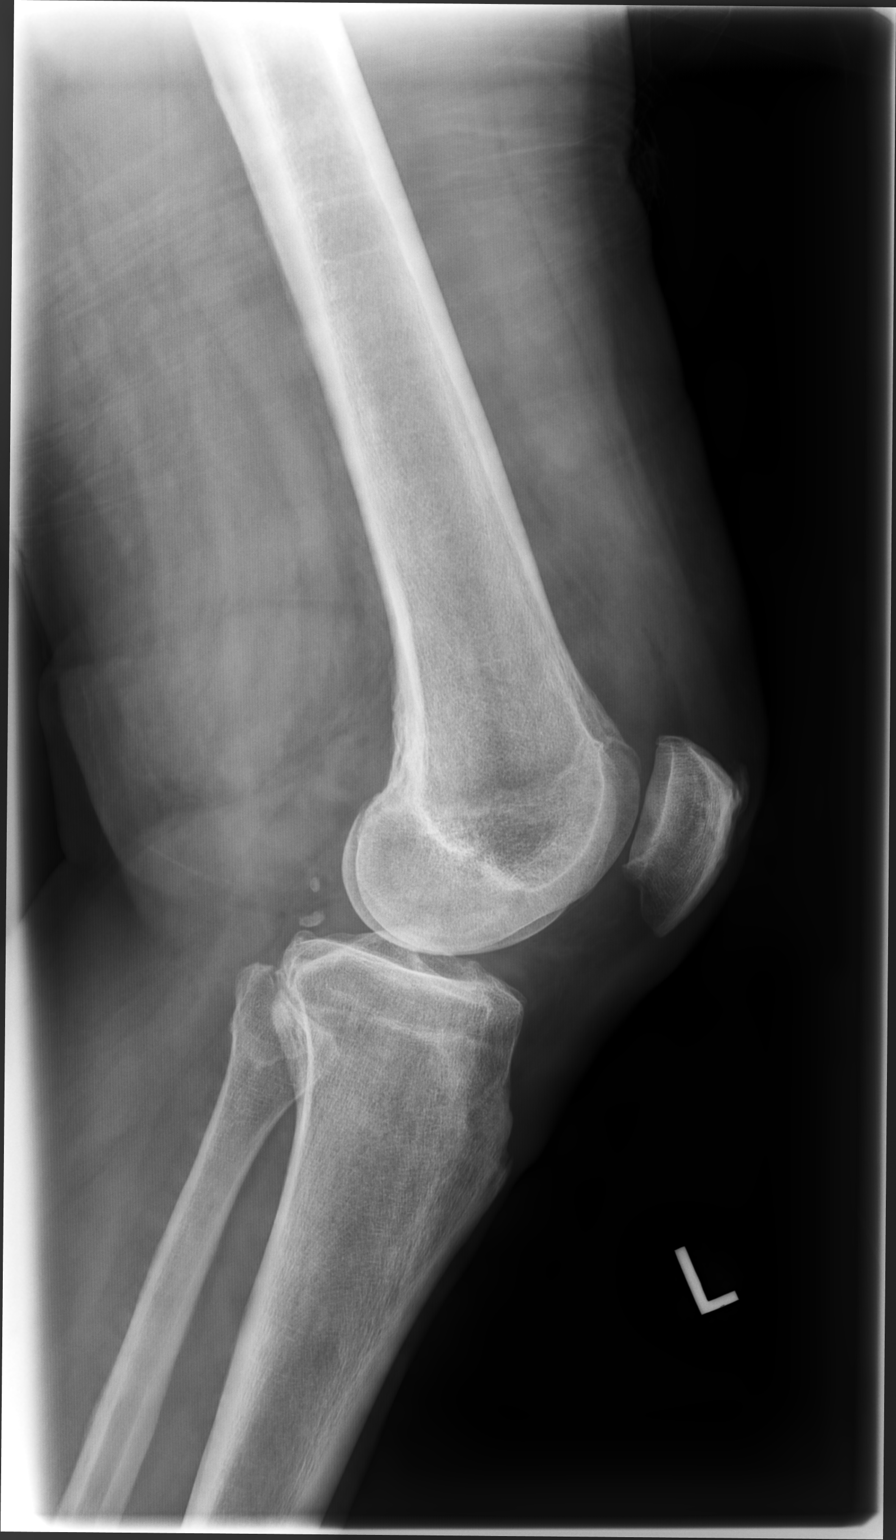

[knee mlo (1 of 2)]
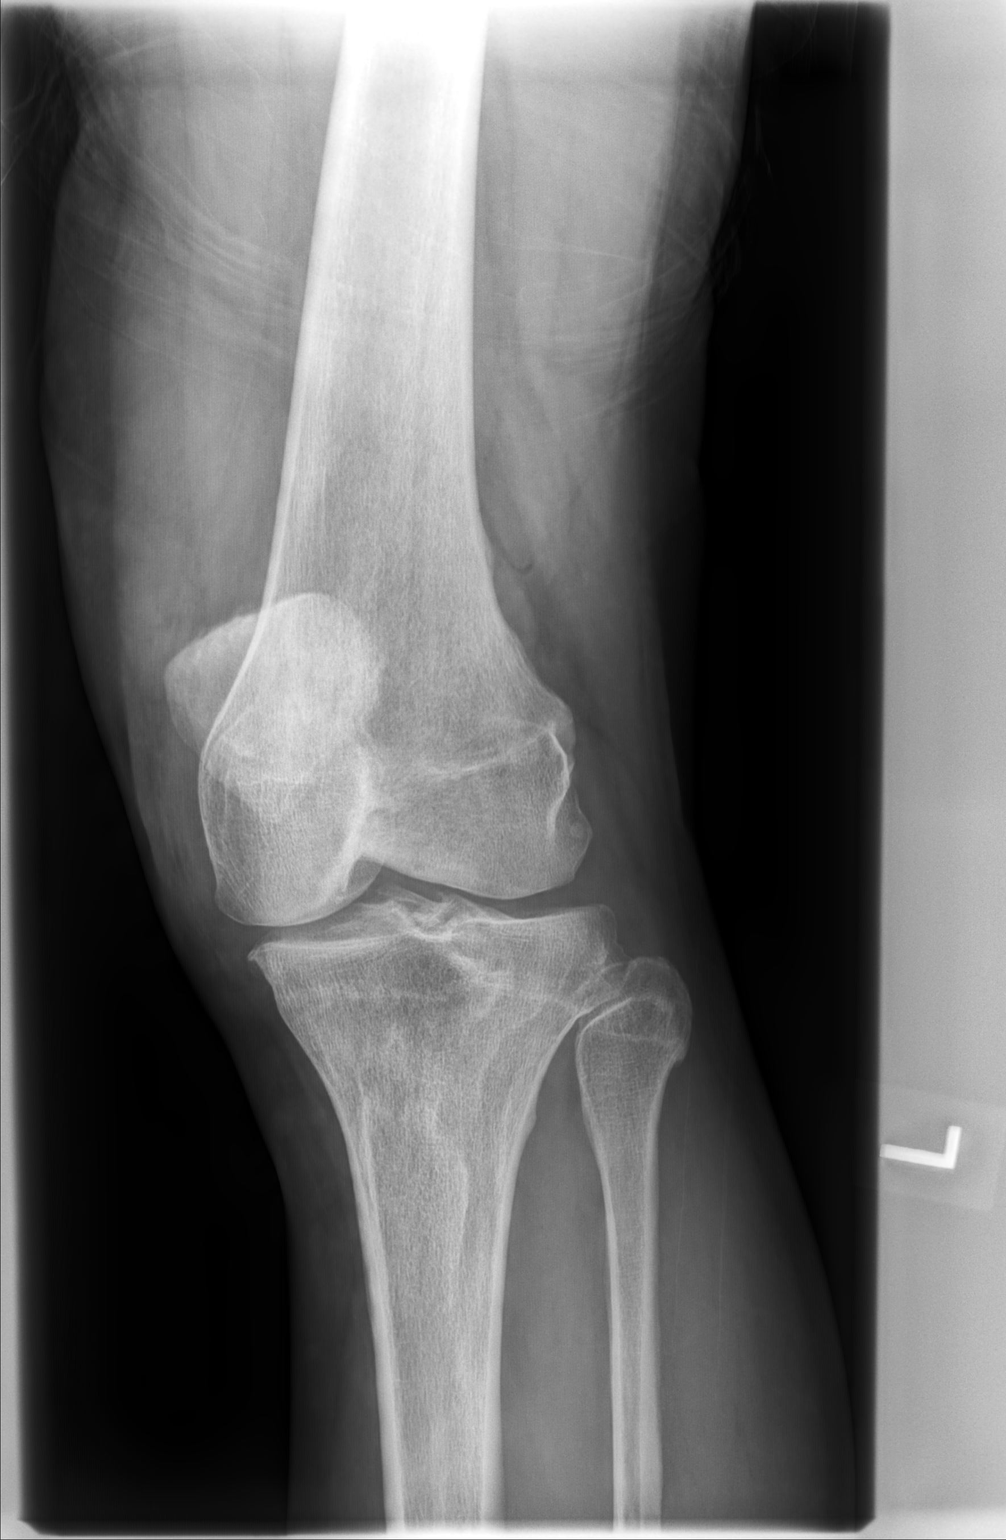

[knee mlo (2 of 2)]
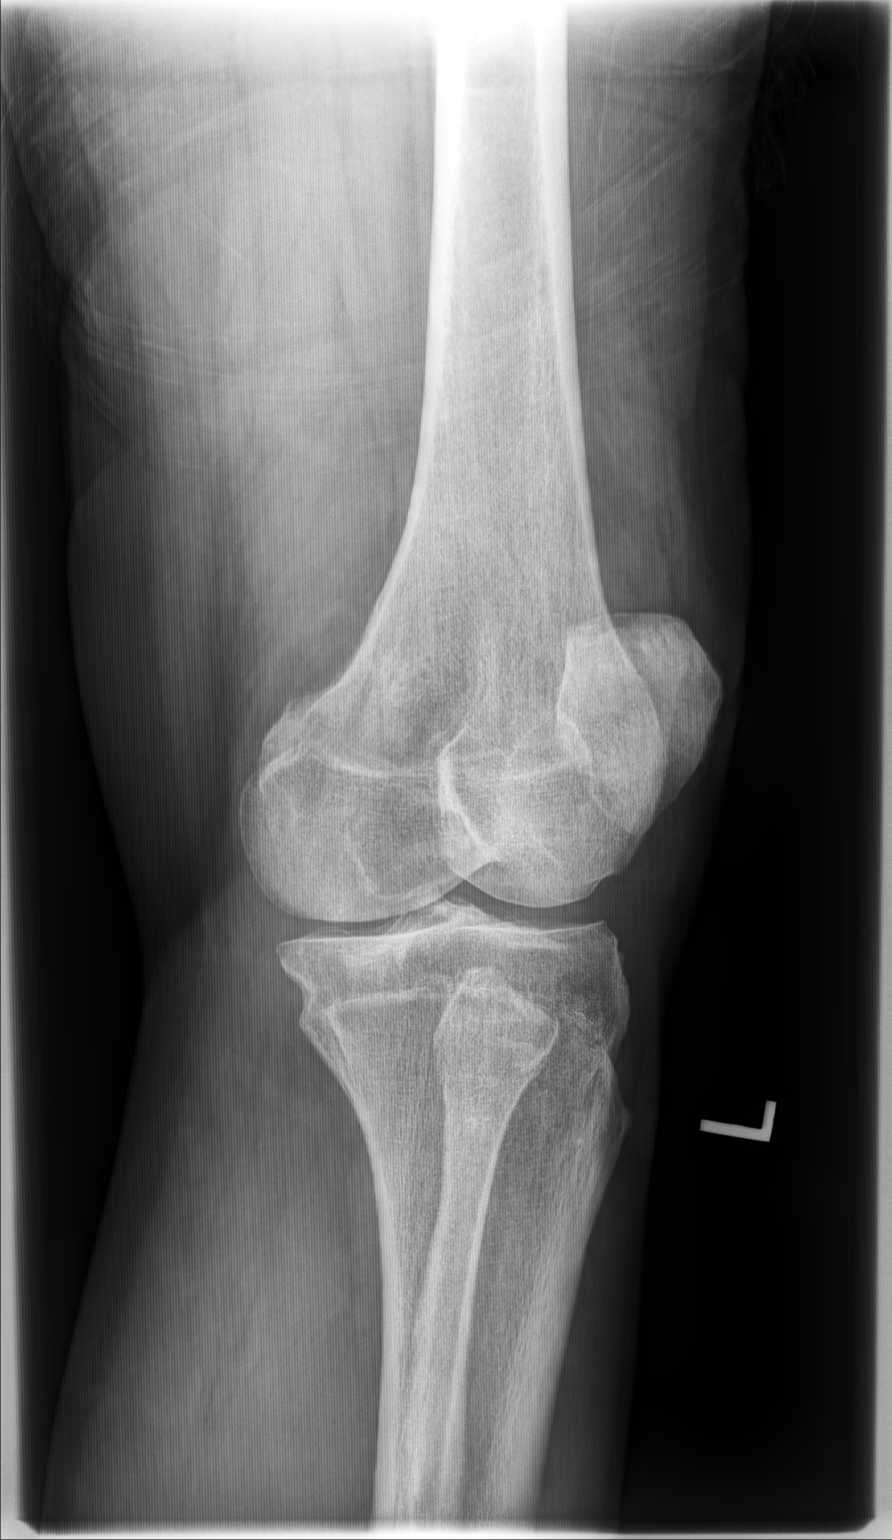

[4 of 4 positions shown; findings below may reference images not displayed]

FINDINGS: Progressive diffuse tricompartment degenerative change. No acute
bony abnormality. No evidence of fracture or dislocation. Small knee
joint effusion.
IMPRESSION: Progressive diffuse tricompartment degenerative change. No acute
bony abnormality. Small knee joint effusion.

## 2019-02-21 NOTE — Progress Notes (Signed)
[name] [address] [place] [address] Phone: [phone] Subjective:   I Ronelle Nigh am serving as a Neurosurgeon for Dr. Antoine Primas.  This visit occurred during the SARS-CoV-2 public health emergency.  Safety protocols were in place, including screening questions prior to the visit, additional usage of staff PPE, and extensive cleaning of exam room while observing appropriate contact time as indicated for disinfecting solutions.   I'm seeing this patient by the request  of:  [name], MD  CC: Left knee pain  [id number]   09/15/2018 GARET HOOTON is a 69 y.o. male coming in with complaint of right hand pain. States he is feeling a lot better. Small amount of soreness.  Patient states that has full range of motion now.  Has noticed that when he shakes and has some difficulty.   EURIAH MATLACK is a 69 y.o. male coming in with complaint of left knee pain. Patient states the past week his legs bilaterally "feel heavy". Doesn't have much pain but has some discomfort in his heels and feet. 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 States there is fluid build up in the knee. Some lower back pain as well. Pain radiates proximal and distal leg. States he feels like there is "pressure".   Onset- Acute  Location - lateral, anterior pain some posterior pain  Duration-  Character- Aggravating factors- flexion (anterior knee pain), extension (posterior knee pain), stairs  Reliving factors-  N/A Therapies tried-  Severity-  3-7/10 at its worse depending on the activity     Reviewing patient's chart seen greater than 3 years ago for what was more of a degenerative arthritis of the knees bilaterally. Had responded fairly well to conservative therapy.  Past Medical History:  Diagnosis Date  . ALLERGIC RHINITIS 06/29/2007   Qualifier: Diagnosis of  By: Jonny Ruiz MD, Len Blalock   . Allergy    seasonal  . HYPERLIPIDEMIA  06/29/2007   Qualifier: Diagnosis of  By: Jonny Ruiz MD, Len Blalock   . Right inguinal hernia 06/24/2011   Past Surgical History:  Procedure Laterality Date  . INGUINAL HERNIA REPAIR    . sinus surgury  1992   dr Haroldine Laws   Social History   Socioeconomic History  . Marital status: Married    Spouse name: Not on file  . Number of children: Not on file  . Years of education: Not on file  . Highest education level: Not on file  Occupational History  . Not on file  Tobacco Use  . Smoking status: Never Smoker  . Smokeless tobacco: Never Used  Substance and Sexual Activity  . Alcohol use: No  . Drug use: No  . Sexual activity: Not on file  Other Topics Concern  . Not on file  Social History Narrative  . Not on file   Social Determinants of Health   Financial Resource Strain:   . Difficulty of Paying Living Expenses: Not on file  Food Insecurity:   . Worried About Programme researcher, broadcasting/film/video in the Last Year: Not on file  . Ran Out of Food in the Last Year: Not on file  Transportation Needs:   . Lack of Transportation (Medical): Not on file  . Lack of Transportation (Non-Medical): Not on file  Physical Activity:   . Days of Exercise per Week: Not on file  . Minutes of Exercise per Session: Not on file  Stress:   .  Feeling of Stress : Not on file  Social Connections:   . Frequency of Communication with Friends and Family: Not on file  . Frequency of Social Gatherings with Friends and Family: Not on file  . Attends Religious Services: Not on file  . Active Member of Clubs or Organizations: Not on file  . Attends Banker Meetings: Not on file  . Marital Status: Not on file   Allergies  Allergen Reactions  . Acetaminophen     REACTION: eyes swell  . Aspirin     REACTION: eyes swell  . Ibuprofen     REACTION: eyes swell  . Penicillins     REACTION: pt cant remember   Family History  Problem Relation Age of Onset  . Parkinsonism Unknown   . Prostate cancer Unknown     . Hypertension Unknown   . Colon cancer Neg Hx   . Rectal cancer Neg Hx   . Stomach cancer Neg Hx      Current Outpatient Medications (Cardiovascular):  .  atorvastatin (LIPITOR) 20 MG tablet, Take 1 tablet (20 mg total) by mouth daily.     Current Outpatient Medications (Other):  Marland Kitchen  Vitamin D, Ergocalciferol, (DRISDOL) 1.25 MG (50000 UT) CAPS capsule, Take 1 capsule (50,000 Units total) by mouth every 7 (seven) days.   Reviewed prior external information including notes and imaging from  primary care provider As well as notes that were available from care everywhere and other healthcare systems.  Past medical history, social, surgical and family history all reviewed in electronic medical record.  No pertanent information unless stated regarding to the chief complaint.   Review of Systems:  No headache, visual changes, nausea, vomiting, diarrhea, constipation, dizziness, abdominal pain, skin rash, fevers, chills, night sweats, weight loss, swollen lymph nodes, body aches, joint swelling, chest pain, shortness of breath, mood changes. POSITIVE muscle aches  Objective  Blood pressure 140/90, pulse 74, height 6\' 2"  (1.88 m), weight 215 lb (97.5 kg), SpO2 96 %.   General: No apparent distress alert and oriented x3 mood and affect normal, dressed appropriately.  HEENT: Pupils equal, extraocular movements intact  Respiratory: Patient's speak in full sentences and does not appear short of breath  Cardiovascular: No lower extremity edema, non tender, no erythema  Skin: Warm dry intact with no signs of infection or rash on extremities or on axial skeleton.  Abdomen: Soft nontender  Neuro: Cranial nerves II through XII are intact, neurovascularly intact in all extremities with 2+ DTRs and 2+ pulses.  Lymph: No lymphadenopathy of posterior or anterior cervical chain or axillae bilaterally.  Gait antalgic MSK: Knee: Left valgus deformity noted. Large thigh to calf ratio. Huge effusion  noted Tender to palpation over medial and PF joint line.  ROM full in flexion and extension and lower leg rotation. instability with valgus force.  painful patellar compression. Patellar glide with moderate crepitus. Patellar and quadriceps tendons unremarkable. Hamstring and quadriceps strength is normal. Contralateral knee shows minimal arthritic changes and minorly tender over the medial joint space  Limited musculoskeletal ultrasound was performed and interpreted by  Limited ultrasound shows the patient did have a large joint effusion noted. Moderate narrowing of the patellofemoral space. Moderate to severe narrowing of the medial joint space with degenerative changes of the medial meniscus. Impression: Knee effusion with knee arthritis  Procedure: Real-time Ultrasound Guided Injection of left knee Device: GE Logiq Q7 Ultrasound guided injection is preferred based studies that show increased duration, increased  effect, greater accuracy, decreased procedural pain, increased response rate, and decreased cost with ultrasound guided versus blind injection.  Verbal informed consent obtained.  Time-out conducted.  Noted no overlying erythema, induration, or other signs of local infection.  Skin prepped in a sterile fashion.  Local anesthesia: Topical Ethyl chloride.  With sterile technique and under real time ultrasound guidance: With a 22-gauge 2 inch needle patient was injected with 4 cc of 0.5% Marcaine and aspirated 40 cc of straw-colored fluid then injected 1 cc of Kenalog 40 mg/dL. This was from a superior lateral approach.  Completed without difficulty  Pain immediately resolved suggesting accurate placement of the medication.  Advised to call if fevers/chills, erythema, induration, drainage, or persistent bleeding.  Images permanently stored and available for review in the ultrasound unit.  Impression: Technically successful ultrasound guided injection.      Impression and Recommendations:     This case required medical decision making of moderate complexity. The above documentation has been reviewed and is accurate and complete Lyndal Pulley, DO       Note: This dictation was prepared with Dragon dictation along with smaller phrase technology. Any transcriptional errors that result from this process are unintentional.

## 2019-02-21 NOTE — Assessment & Plan Note (Signed)
Left knee injected today, did have an effusion.  Known arthritic changes.  Repeat x-rays are pending.  Discussed home exercise, icing regimen, topical anti-inflammatories.  We will also get the x-ray secondary to the increase in PSA recently.  Patient will then follow-up with me again in 4 weeks.  Could be candidate for viscosupplementation

## 2019-02-21 NOTE — Patient Instructions (Signed)
Good to see you.  Ice 20 minutes 2 times daily. Usually after activity and before bed. Exercises 3 times a week.  pennsaid pinkie amount topically 2 times daily as needed.  Compression sleeve See me again in 4 weeks

## 2019-02-27 ENCOUNTER — Other Ambulatory Visit: Payer: Self-pay

## 2019-02-27 ENCOUNTER — Ambulatory Visit (INDEPENDENT_AMBULATORY_CARE_PROVIDER_SITE_OTHER): Payer: BC Managed Care – PPO | Admitting: Internal Medicine

## 2019-02-27 ENCOUNTER — Ambulatory Visit (INDEPENDENT_AMBULATORY_CARE_PROVIDER_SITE_OTHER): Payer: BC Managed Care – PPO

## 2019-02-27 ENCOUNTER — Encounter: Payer: Self-pay | Admitting: Internal Medicine

## 2019-02-27 VITALS — BP 144/90 | HR 72 | Temp 98.8°F | Ht 74.0 in | Wt 209.0 lb

## 2019-02-27 DIAGNOSIS — M5442 Lumbago with sciatica, left side: Secondary | ICD-10-CM

## 2019-02-27 DIAGNOSIS — M545 Low back pain, unspecified: Secondary | ICD-10-CM | POA: Insufficient documentation

## 2019-02-27 DIAGNOSIS — R972 Elevated prostate specific antigen [PSA]: Secondary | ICD-10-CM

## 2019-02-27 DIAGNOSIS — E559 Vitamin D deficiency, unspecified: Secondary | ICD-10-CM | POA: Insufficient documentation

## 2019-02-27 DIAGNOSIS — E785 Hyperlipidemia, unspecified: Secondary | ICD-10-CM | POA: Diagnosis not present

## 2019-02-27 DIAGNOSIS — Z1211 Encounter for screening for malignant neoplasm of colon: Secondary | ICD-10-CM

## 2019-02-27 DIAGNOSIS — R030 Elevated blood-pressure reading, without diagnosis of hypertension: Secondary | ICD-10-CM | POA: Diagnosis not present

## 2019-02-27 LAB — PSA: PSA: 5.2 ng/mL — ABNORMAL HIGH (ref 0.10–4.00)

## 2019-02-27 IMAGING — DX DG LUMBAR SPINE COMPLETE 4+V
5 series · 5 of 5 positions shown · non-contrast
Comparison: None.

CLINICAL DATA: 68-year-old male with low back pain for the past 7
days. No known injury.

EXAM:
LUMBAR SPINE - COMPLETE 4+ VIEW

[lumbar spine ap]
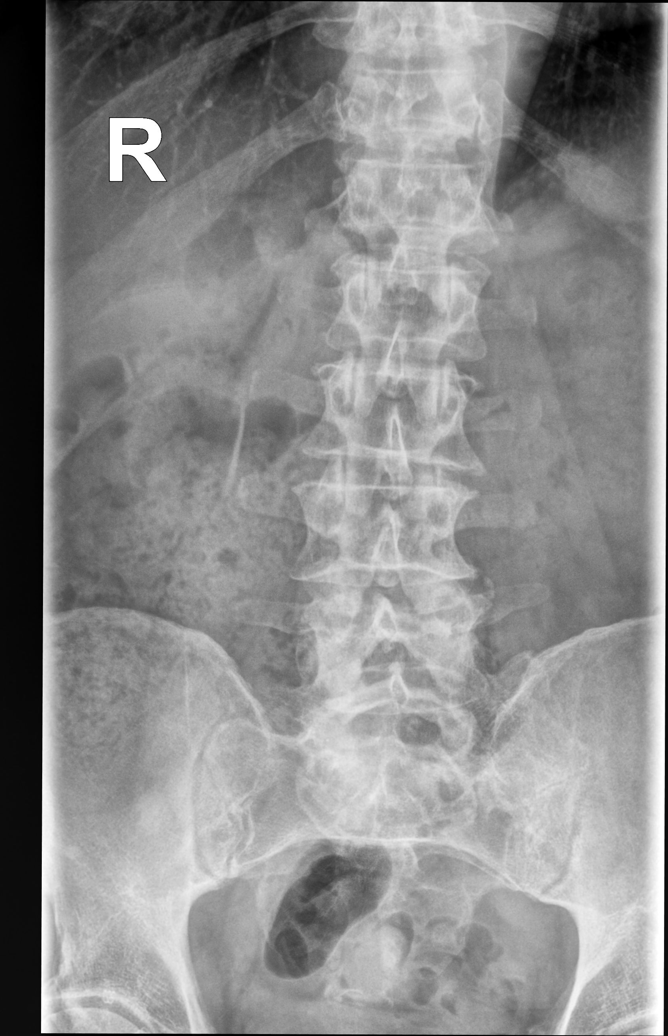

[lumbar spine mlo (1 of 2)]
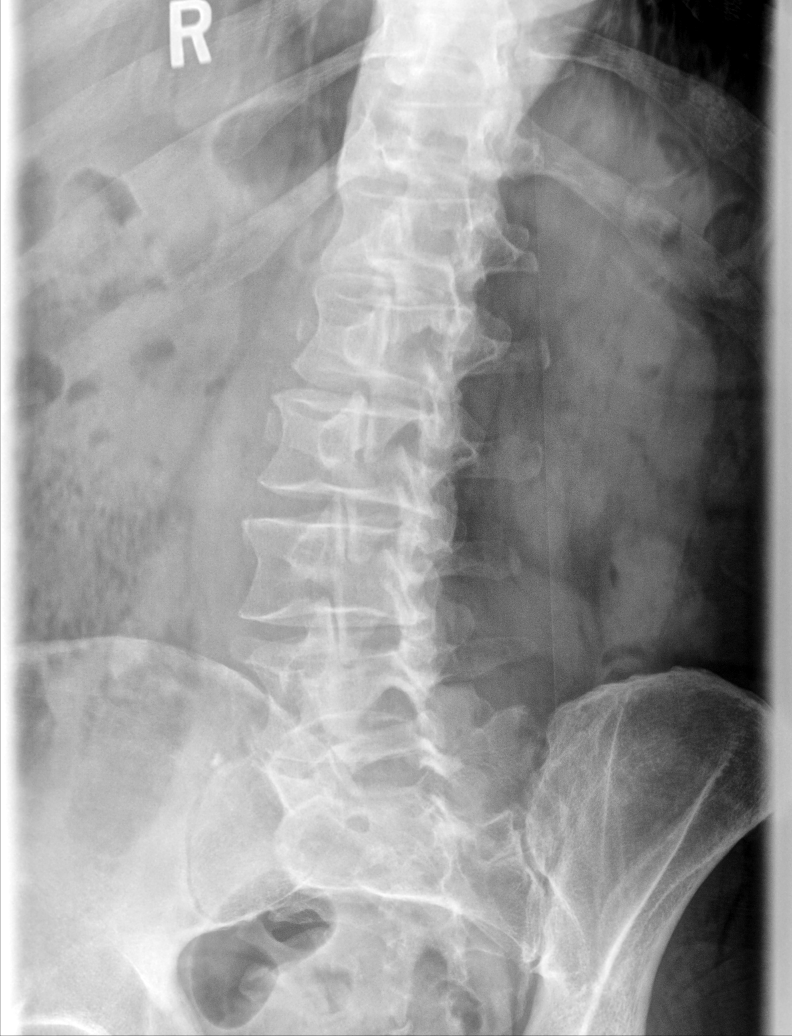

[lumbar spine mlo (2 of 2)]
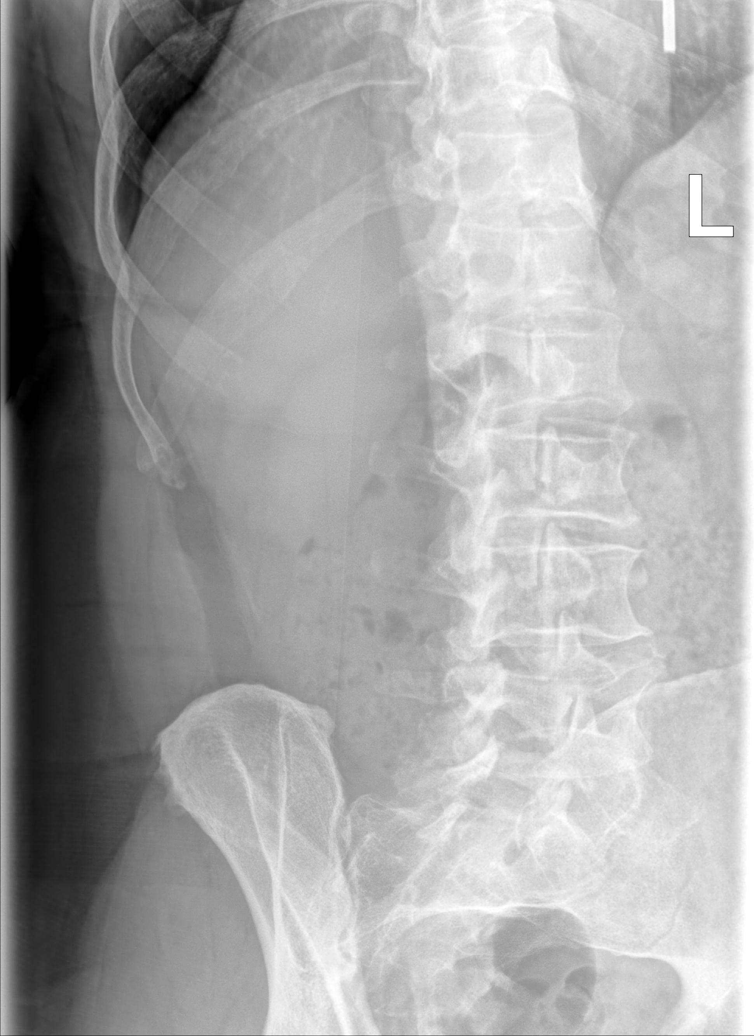

[lumbar spine lat (1 of 2)]
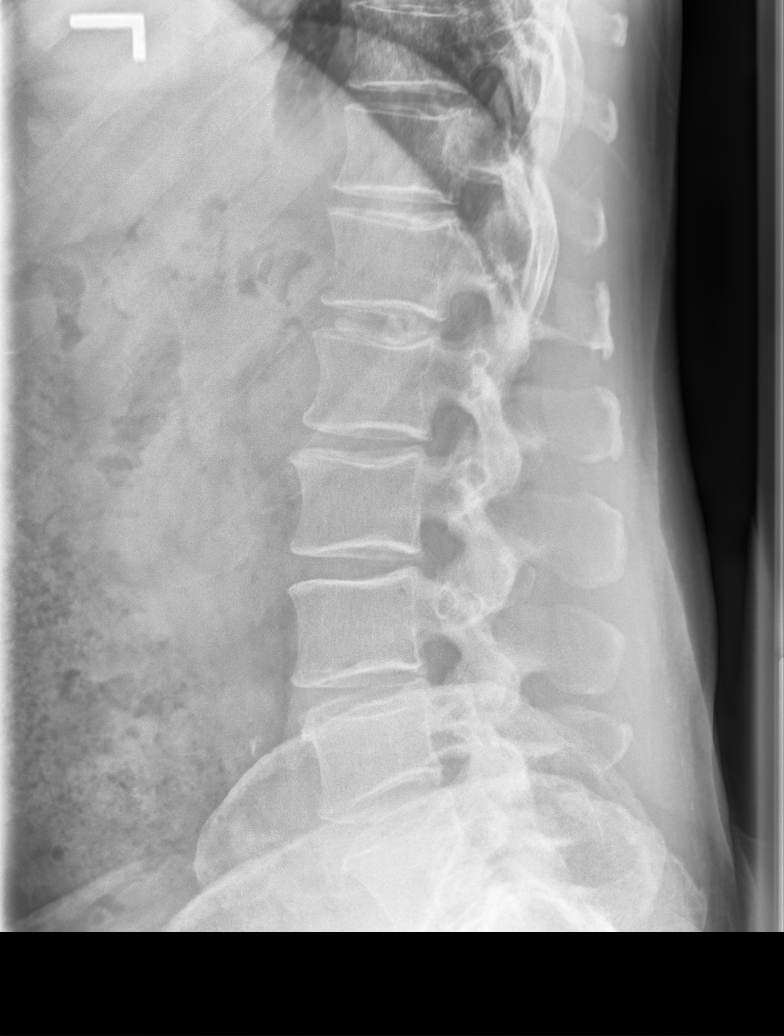

[lumbar spine lat (2 of 2)]
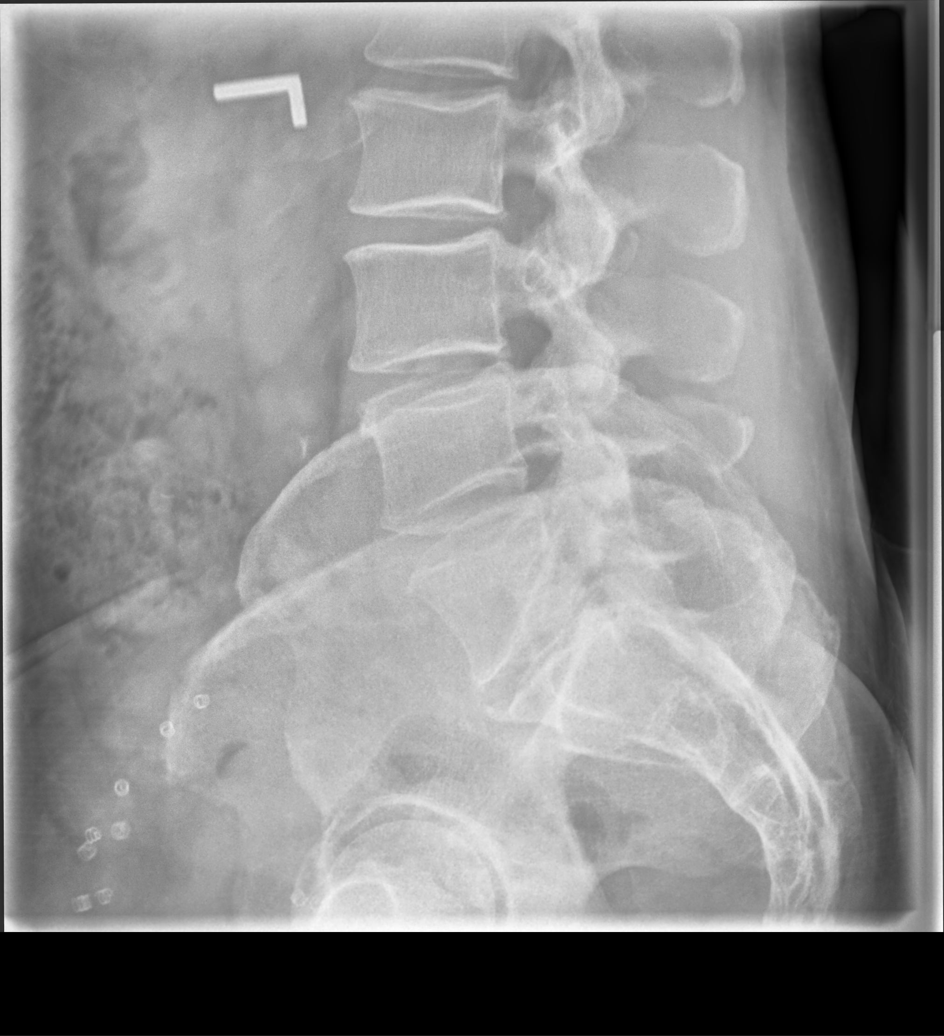

[5 of 5 positions shown; findings below may reference images not displayed]

FINDINGS: No evidence of acute fracture or malalignment. Degenerative disc
disease is present at L4-L5 and L5-S1. There is mild loss of disc
space height and endplate sclerosis. Additionally, facet arthropathy
is noted at L4-L5. No evidence of spondylolysis or anterolisthesis.
Helical surgical clips project over the inguinal region suggesting
prior laparoscopic hernia repair. Normal bony mineralization. No
lytic or blastic osseous lesion.
IMPRESSION: 1. No evidence of acute fracture or malalignment.
2. Lower lumbar degenerative disc disease and facet arthropathy.

## 2019-02-27 MED ORDER — TRAMADOL HCL 50 MG PO TABS: 50.0000 mg | ORAL_TABLET | Freq: Four times a day (QID) | ORAL | 0 refills | Status: DC | PRN

## 2019-02-27 MED ORDER — ATORVASTATIN CALCIUM 20 MG PO TABS: 20.0000 mg | ORAL_TABLET | Freq: Every day | ORAL | 3 refills | Status: DC

## 2019-02-27 MED ORDER — VITAMIN D (ERGOCALCIFEROL) 1.25 MG (50000 UNIT) PO CAPS: 50000.0000 [IU] | ORAL_CAPSULE | ORAL | 0 refills | Status: DC

## 2019-02-27 MED ORDER — PREDNISONE 10 MG PO TABS: ORAL_TABLET | ORAL | 0 refills | Status: DC

## 2019-02-27 NOTE — Progress Notes (Signed)
Subjective:    Patient ID: Alexander Pierce, male    DOB: July 21, 1950, 69 y.o.   MRN: [id number]  HPI  Here with 2 wk acute osnet LBP bialteral moderate with interrmittent milder pain raidating to the distal LLE; started after stepping high up on a stair to climb on a tractor; also has left knee DJD but pain and swelling not worse.  BP has been elevated at home > 140;90.  Has not taken vit d high dose or started new statin yet. Denies urinary symptoms such as dysuria, frequency, urgency, flank pain, hematuria or n/v, fever, chills, has reschedyle urology appt for feb 22.  Also due for f/u cologuard.   BP Readings from Last 3 Encounters:  02/27/19 (!) 144/90  02/21/19 140/90  11/24/18 124/82   Wt Readings from Last 3 Encounters:  02/27/19 209 lb (94.8 kg)  02/21/19 215 lb (97.5 kg)  11/24/18 213 lb (96.6 kg)   Past Medical History:  Diagnosis Date  . ALLERGIC RHINITIS 06/29/2007   Qualifier: Diagnosis of  By: Jonny Ruiz MD, Len Blalock   . Allergy    seasonal  . HYPERLIPIDEMIA 06/29/2007   Qualifier: Diagnosis of  By: Jonny Ruiz MD, Len Blalock   . Right inguinal hernia 06/24/2011   Past Surgical History:  Procedure Laterality Date  . INGUINAL HERNIA REPAIR    . sinus surgury  1992   dr Haroldine Laws    reports that he has never smoked. He has never used smokeless tobacco. He reports that he does not drink alcohol or use drugs. family history includes Hypertension in his unknown relative; Parkinsonism in his unknown relative; Prostate cancer in his unknown relative. Allergies  Allergen Reactions  . Acetaminophen     REACTION: eyes swell  . Aspirin     REACTION: eyes swell  . Ibuprofen     REACTION: eyes swell  . Penicillins     REACTION: pt cant remember   No current outpatient medications on file prior to visit.   No current facility-administered medications on file prior to visit.   Review of Systems All otherwise neg per pt     Objective:   Physical Exam BP (!) 144/90   Pulse 72   Temp  98.8 F (37.1 C)   Ht 6\' 2"  (1.88 m)   Wt 209 lb (94.8 kg)   SpO2 99%   BMI 26.83 kg/m  VS noted,  Constitutional: Pt appears in NAD HENT: Head: NCAT.  Right Ear: External ear normal.  Left Ear: External ear normal.  Eyes: . Pupils are equal, round, and reactive to light. Conjunctivae and EOM are normal Nose: without d/c or deformity Neck: Neck supple. Gross normal ROM Cardiovascular: Normal rate and regular rhythm.   Pulmonary/Chest: Effort normal and breath sounds without rales or wheezing.  Abd:  Soft, NT, ND, + BS, no organomegaly Neurological: Pt is alert. At baseline orientation, motor grossly intact Skin: Skin is warm. No rashes, other new lesions, no LE edema Psychiatric: Pt behavior is normal without agitation  All otherwise neg per pt Lab Results  Component Value Date   WBC 6.6 11/24/2018   HGB 15.0 11/24/2018   HCT 43.9 11/24/2018   PLT 277.0 11/24/2018   GLUCOSE 95 11/24/2018   CHOL 201 (H) 11/24/2018   TRIG 96.0 11/24/2018   HDL 39.70 11/24/2018   LDLCALC 142 (H) 11/24/2018   ALT 20 11/24/2018   AST 19 11/24/2018   NA 134 (L) 11/24/2018   K 4.2 11/24/2018  CL 99 11/24/2018   CREATININE 0.96 11/24/2018   BUN 14 11/24/2018   CO2 28 11/24/2018   TSH 1.01 11/24/2018   PSA 5.20 (H) 02/27/2019      Assessment & Plan:

## 2019-02-27 NOTE — Patient Instructions (Signed)
Please take all new medication as prescribed - the prednisone, and pain medication as needed  Please continue all other medications as before, and refills have been done if requested.  Please have the pharmacy call with any other refills you may need.  Please continue your efforts at being more active, low cholesterol diet, and weight control.  Please keep your appointments with your specialists as you may have planned - Dr Katrinka Blazing Mar 8, and urology soon  Please go to the XRAY Department in the first floor for the x-ray testing   Please go to the LAB at the blood drawing area for the tests to be done  You will be contacted by phone if any changes need to be made immediately.  Otherwise, you will receive a letter about your results with an explanation, but please check with MyChart first.  Please remember to sign up for MyChart if you have not done so, as this will be important to you in the future with finding out test results, communicating by private email, and scheduling acute appointments online when needed.

## 2019-02-27 NOTE — Assessment & Plan Note (Signed)
To recheck at home and next visit,  to f/u any worsening symptoms or concerns

## 2019-02-27 NOTE — Assessment & Plan Note (Signed)
For oral replacement 

## 2019-02-27 NOTE — Assessment & Plan Note (Signed)
asympt for f/u psa and urology f/u

## 2019-02-27 NOTE — Assessment & Plan Note (Signed)
To start statin, low chol diet

## 2019-02-27 NOTE — Assessment & Plan Note (Signed)
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

## 2019-02-28 ENCOUNTER — Encounter: Payer: Self-pay | Admitting: Internal Medicine

## 2019-03-01 ENCOUNTER — Ambulatory Visit: Payer: BC Managed Care – PPO | Admitting: Family Medicine

## 2019-03-01 NOTE — Addendum Note (Signed)
Addended by: Milus Mallick on: 03/01/2019 01:10 PM   Modules accepted: Orders

## 2019-03-06 LAB — SYNOVIAL CELL COUNT + DIFF, W/ CRYSTALS
Basophils, %: 0 %
Eosinophils-Synovial: 0 % (ref 0–2)
Lymphocytes-Synovial Fld: 84 % — ABNORMAL HIGH (ref 0–74)
Monocyte/Macrophage: 8 % (ref 0–69)
Neutrophil, Synovial: 7 % (ref 0–24)
Synoviocytes, %: 1 % (ref 0–15)
WBC, Synovial: 18 cells/uL (ref <150)

## 2019-03-06 LAB — TIQ-NTM

## 2019-03-15 LAB — COLOGUARD
COLOGUARD: NEGATIVE
Cologuard: NEGATIVE
Cologuard: NEGATIVE

## 2019-03-26 ENCOUNTER — Encounter: Payer: Self-pay | Admitting: Family Medicine

## 2019-03-26 ENCOUNTER — Other Ambulatory Visit: Payer: Self-pay

## 2019-03-26 ENCOUNTER — Ambulatory Visit: Payer: BC Managed Care – PPO | Admitting: Family Medicine

## 2019-03-26 DIAGNOSIS — M170 Bilateral primary osteoarthritis of knee: Secondary | ICD-10-CM | POA: Diagnosis not present

## 2019-03-26 DIAGNOSIS — M5442 Lumbago with sciatica, left side: Secondary | ICD-10-CM

## 2019-03-26 NOTE — Assessment & Plan Note (Signed)
Multifactorial, does have some home exercises given to him today.  X-rays do show some facet arthropathy and some degenerative disc disease.  No weakness of the lower extremities noted today.  Continue to monitor.

## 2019-03-26 NOTE — Assessment & Plan Note (Signed)
Arthritic changes bilaterally.  Patient will hold on any injection at this point we will see if patient is a candidate for viscosupplementation.  Patient wants to hold on any surgical intervention.  Family history of knee replacement in his younger brother.  Patient is wondering if be something that will be necessary in the future and likely at some point.  Patient will continue with conservative therapy.  Follow-up again in 2 to 3 weeks if we can get approval for viscosupplementation

## 2019-03-26 NOTE — Patient Instructions (Signed)
Set up a visit in 2 weeks Exercises 3x a week when you have a flare up

## 2019-03-26 NOTE — Progress Notes (Signed)
[name] [address] [place] [address] Phone: [phone] Subjective:   Alexander Pierce, am serving as a scribe for Dr. Antoine Pierce. This visit occurred during the SARS-CoV-2 public health emergency.  Safety protocols were in place, including screening questions prior to the visit, additional usage of staff PPE, and extensive cleaning of exam room while observing appropriate contact time as indicated for disinfecting solutions.   I'm seeing this patient by the request  of:  Alexander Levins, MD  CC: Knee pain follow-up  [id number]   02/21/2019 Left knee injected today, did have an effusion.  Known arthritic changes.  Repeat x-rays are pending.  Discussed home exercise, icing regimen, topical anti-inflammatories.  We will also get the x-ray secondary to the increase in PSA recently.  Patient will then follow-up with me again in 4 weeks.  Could be candidate for viscosupplementation  Update 03/26/2019 Alexander Pierce is a 69 y.o. male coming in with complaint of left knee pain. Patient states that his pain did improve after the injection. Did develop back pain one week later after the injection. Notes a knot on the lateral aspect of knee that has increased in size after having fluid drawn off of it during injection. Pain has slowly been increasing over lateral aspect.    Patient did have x-rays at last exam.  These were independently visualized by me showing the patient did have progressive diffuse tricompartmental degenerative arthritis with a small effusion.  Aspirated knee February 23, 2019.  Patient also complained of low back pain to primary care per physician and got x-rays taken on February 27, 2019.  These show the patient does have moderate degenerative disc disease from L4-S1 with facet arthropathy at L4-L5  Past Medical History:  Diagnosis Date  . ALLERGIC RHINITIS 06/29/2007   Qualifier: Diagnosis of  By: Jonny Ruiz MD, Len Blalock   . Allergy    seasonal  . HYPERLIPIDEMIA 06/29/2007   Qualifier: Diagnosis of  By: Jonny Ruiz MD, Len Blalock   . Right inguinal hernia 06/24/2011   Past Surgical History:  Procedure Laterality Date  . INGUINAL HERNIA REPAIR    . sinus surgury  1992   dr Haroldine Laws   Social History   Socioeconomic History  . Marital status: Married    Spouse name: Not on file  . Number of children: Not on file  . Years of education: Not on file  . Highest education level: Not on file  Occupational History  . Not on file  Tobacco Use  . Smoking status: Never Smoker  . Smokeless tobacco: Never Used  Substance and Sexual Activity  . Alcohol use: No  . Drug use: No  . Sexual activity: Not on file  Other Topics Concern  . Not on file  Social History Narrative  . Not on file   Social Determinants of Health   Financial Resource Strain:   . Difficulty of Paying Living Expenses: Not on file  Food Insecurity:   . Worried About Programme researcher, broadcasting/film/video in the Last Year: Not on file  . Ran Out of Food in the Last Year: Not on file  Transportation Needs:   . Lack of Transportation (Medical): Not on file  . Lack of Transportation (Non-Medical): Not on file  Physical Activity:   . Days of Exercise per Week: Not on file  . Minutes of Exercise per Session: Not on file  Stress:   . Feeling of Stress : Not on file  Social Connections:   . Frequency of Communication with Friends and Family: Not on file  . Frequency of Social Gatherings with Friends and Family: Not on file  . Attends Religious Services: Not on file  . Active Member of Clubs or Organizations: Not on file  . Attends Archivist Meetings: Not on file  . Marital Status: Not on file   Allergies  Allergen Reactions  . Acetaminophen     REACTION: eyes swell  . Aspirin     REACTION: eyes swell  . Ibuprofen     REACTION: eyes swell  . Penicillins     REACTION: pt cant remember   Family History  Problem Relation Age of Onset  . Parkinsonism Unknown   .  Prostate cancer Unknown   . Hypertension Unknown   . Colon cancer Neg Hx   . Rectal cancer Neg Hx   . Stomach cancer Neg Hx     Current Outpatient Medications (Endocrine & Metabolic):  .  predniSONE (DELTASONE) 10 MG tablet, 3 tabs by mouth per day for 3 days,2tabs per day for 3 days1tab per day for 3 days  Current Outpatient Medications (Cardiovascular):  .  atorvastatin (LIPITOR) 20 MG tablet, Take 1 tablet (20 mg total) by mouth daily.   Current Outpatient Medications (Analgesics):  .  traMADol (ULTRAM) 50 MG tablet, Take 1 tablet (50 mg total) by mouth every 6 (six) hours as needed.   Current Outpatient Medications (Other):  Marland Kitchen  Vitamin D, Ergocalciferol, (DRISDOL) 1.25 MG (50000 UNIT) CAPS capsule, Take 1 capsule (50,000 Units total) by mouth every 7 (seven) days.   Reviewed prior external information including notes and imaging from  primary care provider As well as notes that were available from care everywhere and other healthcare systems.  Past medical history, social, surgical and family history all reviewed in electronic medical record.  No pertanent information unless stated regarding to the chief complaint.   Review of Systems:  No headache, visual changes, nausea, vomiting, diarrhea, constipation, dizziness, abdominal pain, skin rash, fevers, chills, night sweats, weight loss, swollen lymph nodes, body aches, joint swelling, chest pain, shortness of breath, mood changes. POSITIVE muscle aches  Objective  There were no vitals taken for this visit.   General: No apparent distress alert and oriented x3 mood and affect normal, dressed appropriately.  HEENT: Pupils equal, extraocular movements intact  Respiratory: Patient's speak in full sentences and does not appear short of breath  Cardiovascular: No lower extremity edema, non tender, no erythema  Skin: Warm dry intact with no signs of infection or rash on extremities or on axial skeleton.  Abdomen: Soft nontender    Neuro: Cranial nerves II through XII are intact, neurovascularly intact in all extremities with 2+ DTRs and 2+ pulses.  Lymph: No lymphadenopathy of posterior or anterior cervical chain or axillae bilaterally.  Gait normal with good balance and coordination.  MSK:  Non tender with full range of motion and good stability and symmetric strength and tone of shoulders, elbows, wrist, hip, and ankles bilaterally.  Left knee shows the patient still has small effusion noted.  Patient does have some mild instability with valgus and varus force.  Abnormal thigh to calf ratio noted. Back Exam:  Inspection: Mild loss of lordosis Motion: Flexion 45 deg, Extension 35 deg, Side Bending to 35 deg bilaterally,  Rotation to 35 deg bilaterally  SLR laying: Negative  XSLR laying: Negative  Palpable tenderness: Tender to palpation in the paraspinal musculature of the lumbar  spine. FABER: negative. Sensory change: Gross sensation intact to all lumbar and sacral dermatomes.  Reflexes: 2+ at both patellar tendons, 2+ at achilles tendons, Babinski's downgoing.  Strength at foot  Plantar-flexion: 5/5 Dorsi-flexion: 5/5 Eversion: 5/5 Inversion: 5/5  Leg strength  Quad: 5/5 Hamstring: 5/5 Hip flexor: 5/5 Hip abductors: 5/5   .   Impression and Recommendations:     The above documentation has been reviewed and is accurate and complete Alexander Saa, DO       Note: This dictation was prepared with Dragon dictation along with smaller phrase technology. Any transcriptional errors that result from this process are unintentional.

## 2019-04-04 ENCOUNTER — Encounter: Payer: Self-pay | Admitting: Internal Medicine

## 2019-05-16 ENCOUNTER — Other Ambulatory Visit: Payer: Self-pay

## 2019-05-16 ENCOUNTER — Ambulatory Visit: Payer: BC Managed Care – PPO | Admitting: Family Medicine

## 2019-05-16 ENCOUNTER — Ambulatory Visit (INDEPENDENT_AMBULATORY_CARE_PROVIDER_SITE_OTHER): Payer: BC Managed Care – PPO

## 2019-05-16 ENCOUNTER — Encounter: Payer: Self-pay | Admitting: Family Medicine

## 2019-05-16 VITALS — BP 130/90 | HR 69 | Ht 77.0 in | Wt 215.0 lb

## 2019-05-16 DIAGNOSIS — G8929 Other chronic pain: Secondary | ICD-10-CM

## 2019-05-16 DIAGNOSIS — M170 Bilateral primary osteoarthritis of knee: Secondary | ICD-10-CM

## 2019-05-16 DIAGNOSIS — M25562 Pain in left knee: Secondary | ICD-10-CM | POA: Diagnosis not present

## 2019-05-16 NOTE — Progress Notes (Signed)
[facility] [place] [address] [place] Phone: [phone] Subjective:   I Alexander Pierce am serving as a Education administrator for Dr. Hulan Saas.  This visit occurred during the SARS-CoV-2 public health emergency.  Safety protocols were in place, including screening questions prior to the visit, additional usage of staff PPE, and extensive cleaning of exam room while observing appropriate contact time as indicated for disinfecting solutions.   I'm seeing this patient by the request  of:  Biagio Borg, MD  CC: Left knee pain and swelling  [id number]   03/26/2019 Arthritic changes bilaterally.  Patient will hold on any injection at this point we will see if patient is a candidate for viscosupplementation.  Patient wants to hold on any surgical intervention.  Family history of knee replacement in his younger brother.  Patient is wondering if be something that will be necessary in the future and likely at some point.  Patient will continue with conservative therapy.  Follow-up again in 2 to 3 weeks if we can get approval for viscosupplementation  Multifactorial, does have some home exercises given to him today.  X-rays do show some facet arthropathy and some degenerative disc disease.  No weakness of the lower extremities noted today.  Continue to monitor.   Update 05/16/2019 Alexander Pierce is a 69 y.o. male coming in with complaint of bilateral knee pain. Patient states he has fluid in the left knee.  Patient states still having some pain on a regular basis.  Feels like it is significant enough at the moment but it is having difficulty with even some daily activities.  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     Past Medical History:  Diagnosis Date  . ALLERGIC RHINITIS 06/29/2007   Qualifier: Diagnosis of  By: Jenny Reichmann MD, Hunt Oris   . Allergy    seasonal  . HYPERLIPIDEMIA 06/29/2007   Qualifier: Diagnosis of  By: Jenny Reichmann  MD, Hunt Oris   . Right inguinal hernia 06/24/2011   Past Surgical History:  Procedure Laterality Date  . INGUINAL HERNIA REPAIR    . sinus surgury  1992   dr Ernesto Rutherford   Social History   Socioeconomic History  . Marital status: Married    Spouse name: Not on file  . Number of children: Not on file  . Years of education: Not on file  . Highest education level: Not on file  Occupational History  . Not on file  Tobacco Use  . Smoking status: Never Smoker  . Smokeless tobacco: Never Used  Substance and Sexual Activity  . Alcohol use: No  . Drug use: No  . Sexual activity: Not on file  Other Topics Concern  . Not on file  Social History Narrative  . Not on file   Social Determinants of Health   Financial Resource Strain:   . Difficulty of Paying Living Expenses:   Food Insecurity:   . Worried About Charity fundraiser in the Last Year:   . Arboriculturist in the Last Year:   Transportation Needs:   . Film/video editor (Medical):   Marland Kitchen Lack of Transportation (Non-Medical):   Physical Activity:   . Days of Exercise per Week:   . Minutes of Exercise per Session:   Stress:   . Feeling of Stress :   Social Connections:   . Frequency of Communication with Friends and Family:   . Frequency of Social  Gatherings with Friends and Family:   . Attends Religious Services:   . Active Member of Clubs or Organizations:   . Attends Banker Meetings:   Marland Kitchen Marital Status:    Allergies  Allergen Reactions  . Acetaminophen     REACTION: eyes swell  . Aspirin     REACTION: eyes swell  . Ibuprofen     REACTION: eyes swell  . Penicillins     REACTION: pt cant remember   Family History  Problem Relation Age of Onset  . Parkinsonism Unknown   . Prostate cancer Unknown   . Hypertension Unknown   . Colon cancer Neg Hx   . Rectal cancer Neg Hx   . Stomach cancer Neg Hx    No current outpatient medications on file.   Reviewed prior external information including  notes and imaging from  primary care provider As well as notes that were available from care everywhere and other healthcare systems.  Past medical history, social, surgical and family history all reviewed in electronic medical record.  No pertanent information unless stated regarding to the chief complaint.   Review of Systems:  No headache, visual changes, nausea, vomiting, diarrhea, constipation, dizziness, abdominal pain, skin rash, fevers, chills, night sweats, weight loss, swollen lymph nodes, body aches, chest pain, shortness of breath, mood changes. POSITIVE muscle aches, joint swelling  Objective  Blood pressure 130/90, pulse 69, height 6\' 5"  (1.956 m), weight 215 lb (97.5 kg), SpO2 94 %.   General: No apparent distress alert and oriented x3 mood and affect normal, dressed appropriately.  HEENT: Pupils equal, extraocular movements intact  Respiratory: Patient's speak in full sentences and does not appear short of breath  Cardiovascular: No lower extremity edema, non tender, no erythema  Neuro: Cranial nerves II through XII are intact, neurovascularly intact in all extremities with 2+ DTRs and 2+ pulses.  Antalgic gait MSK: Left knee significant effusion noted.  Patient does have limited range of motion with significant tightness in the knee.  Abnormal thigh to calf ratio.  No instability is most tenderness being in the patellofemoral and the medial joint space.  Contralateral knee has arthritic changes with mild instability but really no pain today  Procedure: Real-time Ultrasound Guided Injection of left knee aspiration and injection Device: GE Logiq Q7 Ultrasound guided injection is preferred based studies that show increased duration, increased effect, greater accuracy, decreased procedural pain, increased response rate, and decreased cost with ultrasound guided versus blind injection.  Verbal informed consent obtained.  Time-out conducted.  Noted no overlying erythema,  induration, or other signs of local infection.  Skin prepped in a sterile fashion.  Local anesthesia: Topical Ethyl chloride.  With sterile technique and under real time ultrasound guidance: With an 18-gauge 1-1/2 inch needle patient was injected with 2 cc of 0.5% Marcaine and aspirated 55 cc of straw-colored fluid with mild blood tinge.  Patient then had injection of Monovisc 48 mg per 3 mL. Completed without difficulty  Pain immediately resolved suggesting accurate placement of the medication.  Advised to call if fevers/chills, erythema, induration, drainage, or persistent bleeding.  Images permanently stored and available for review in the ultrasound unit.  Impression: Technically successful ultrasound guided injection.    Impression and Recommendations:     This case required medical decision making of moderate complexity. The above documentation has been reviewed and is accurate and complete , DO       Note: This dictation was prepared with Dragon dictation  along with smaller phrase technology. Any transcriptional errors that result from this process are unintentional.

## 2019-05-16 NOTE — Patient Instructions (Signed)
Good to see you Did the gel will take a month to work Continue using brace ice and pennsaid See me again in 6 weeks

## 2019-05-16 NOTE — Assessment & Plan Note (Signed)
Viscosupplementation of the left knee given today.  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  Small amount of a hemarthrosis noted on aspiration today.  Patient declined a brace and we discussed how that would give some stability.  Follow-up again 6 weeks

## 2019-07-04 ENCOUNTER — Ambulatory Visit: Payer: Self-pay

## 2019-07-04 ENCOUNTER — Encounter: Payer: Self-pay | Admitting: Family Medicine

## 2019-07-04 ENCOUNTER — Ambulatory Visit: Payer: BC Managed Care – PPO | Admitting: Family Medicine

## 2019-07-04 ENCOUNTER — Other Ambulatory Visit: Payer: Self-pay

## 2019-07-04 VITALS — BP 110/80 | HR 71 | Ht 77.0 in | Wt 215.0 lb

## 2019-07-04 DIAGNOSIS — M25562 Pain in left knee: Secondary | ICD-10-CM

## 2019-07-04 DIAGNOSIS — G8929 Other chronic pain: Secondary | ICD-10-CM

## 2019-07-04 DIAGNOSIS — M170 Bilateral primary osteoarthritis of knee: Secondary | ICD-10-CM | POA: Diagnosis not present

## 2019-07-04 NOTE — Patient Instructions (Addendum)
Good to see you Hoka or oofos sandals in the house Alexander Pierce will call you about the brace  See me again in 2 months

## 2019-07-04 NOTE — Progress Notes (Signed)
[place] [address] [place] Phone: [phone] Subjective:   I Kandace Blitz am serving as a Education administrator for Dr. Hulan Saas.  This visit occurred during the SARS-CoV-2 public health emergency.  Safety protocols were in place, including screening questions prior to the visit, additional usage of staff PPE, and extensive cleaning of exam room while observing appropriate contact time as indicated for disinfecting solutions.   I'm seeing this patient by the request  of:  Biagio Borg, MD  CC: Left knee pain  [id number]   05/16/2019 Viscosupplementation of the left knee given today.  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  Small amount of a hemarthrosis noted on aspiration today.  Patient declined a brace and we discussed how that would give some stability.  Follow-up again 6 weeks  Update 07/04/2019 KAIROS PANETTA is a 69 y.o. male coming in with complaint of left knee pain. Patient states his knee is still swelling. Pain radiates to his heel. ROM is good. States he has a "knot" on the lateral side of his knee.  Debility.  States that it is more the swelling that seems to give him more discomfort and pain.      Past Medical History:  Diagnosis Date  . ALLERGIC RHINITIS 06/29/2007   Qualifier: Diagnosis of  By: Jenny Reichmann MD, Hunt Oris   . Allergy    seasonal  . HYPERLIPIDEMIA 06/29/2007   Qualifier: Diagnosis of  By: Jenny Reichmann MD, Hunt Oris   . Right inguinal hernia 06/24/2011   Past Surgical History:  Procedure Laterality Date  . INGUINAL HERNIA REPAIR    . sinus surgury  1992   dr Ernesto Rutherford   Social History   Socioeconomic History  . Marital status: Married    Spouse name: Not on file  . Number of children: Not on file  . Years of education: Not on file  . Highest education level: Not on file  Occupational History  . Not on file  Tobacco Use  .  Smoking status: Never Smoker  . Smokeless tobacco: Never Used  Substance and Sexual Activity  . Alcohol use: No  . Drug use: No  . Sexual activity: Not on file  Other Topics Concern  . Not on file  Social History Narrative  . Not on file   Social Determinants of Health   Financial Resource Strain:   . Difficulty of Paying Living Expenses:   Food Insecurity:   . Worried About Charity fundraiser in the Last Year:   . Arboriculturist in the Last Year:   Transportation Needs:   . Film/video editor (Medical):   Marland Kitchen Lack of Transportation (Non-Medical):   Physical Activity:   . Days of Exercise per Week:   . Minutes of Exercise per Session:   Stress:   . Feeling of Stress :   Social Connections:   . Frequency of Communication with Friends and Family:   . Frequency of Social Gatherings with Friends and Family:   . Attends Religious Services:   . Active Member of Clubs or Organizations:   . Attends Archivist Meetings:   Marland Kitchen Marital Status:    Allergies  Allergen Reactions  . Acetaminophen     REACTION: eyes swell  . Aspirin     REACTION: eyes swell  . Ibuprofen     REACTION: eyes swell  .  Penicillins     REACTION: pt cant remember   Family History  Problem Relation Age of Onset  . Parkinsonism Unknown   . Prostate cancer Unknown   . Hypertension Unknown   . Colon cancer Neg Hx   . Rectal cancer Neg Hx   . Stomach cancer Neg Hx    No current outpatient medications on file.   Reviewed prior external information including notes and imaging from  primary care provider As well as notes that were available from care everywhere and other healthcare systems.  Past medical history, social, surgical and family history all reviewed in electronic medical record.  No pertanent information unless stated regarding to the chief complaint.   Review of Systems:  No headache, visual changes, nausea, vomiting, diarrhea, constipation, dizziness, abdominal pain, skin  rash, fevers, chills, night sweats, weight loss, swollen lymph nodes, body aches, joint swelling, chest pain, shortness of breath, mood changes. POSITIVE muscle aches  Objective  Blood pressure 110/80, pulse 71, height 6\' 5"  (1.956 m), weight 215 lb (97.5 kg), SpO2 93 %.   General: No apparent distress alert and oriented x3 mood and affect normal, dressed appropriately.  HEENT: Pupils equal, extraocular movements intact  Respiratory: Patient's speak in full sentences and does not appear short of breath  Cardiovascular: No lower extremity edema, non tender, no erythema  Neuro: Cranial nerves II through XII are intact, neurovascularly intact in all extremities with 2+ DTRs and 2+ pulses.  MSK mild antalgic gait Left knee has an effusion noted.  Lacks last 10 degrees of flexion.  Instability with valgus and varus force.  Neurovascularly intact distally.  Procedure: Real-time Ultrasound Guided Injection of left knee Device: GE Logiq Q7 Ultrasound guided injection is preferred based studies that show increased duration, increased effect, greater accuracy, decreased procedural pain, increased response rate, and decreased cost with ultrasound guided versus blind injection.  Verbal informed consent obtained.  Time-out conducted.  Noted no overlying erythema, induration, or other signs of local infection.  Skin prepped in a sterile fashion.  Local anesthesia: Topical Ethyl chloride.  With sterile technique and under real time ultrasound guidance: With a 22-gauge 2 inch needle patient was injected with 4 cc of 0.5% Marcaine and aspirated 40 cc of straw-colored fluid then injected 1 cc of Kenalog 40 mg/dL. This was from a superior lateral approach.  Completed without difficulty  Pain immediately resolved suggesting accurate placement of the medication.  Advised to call if fevers/chills, erythema, induration, drainage, or persistent bleeding.  Images permanently stored and available for review in the  ultrasound unit.  Impression: Technically successful ultrasound guided injection.   Impression and Recommendations:     The above documentation has been reviewed and is accurate and complete , DO       Note: This dictation was prepared with Dragon dictation along with smaller phrase technology. Any transcriptional errors that result from this process are unintentional.

## 2019-07-05 ENCOUNTER — Encounter: Payer: Self-pay | Admitting: Family Medicine

## 2019-07-05 NOTE — Assessment & Plan Note (Signed)
Continued swelling of the left knee noted.  Patient did not respond as well today gentle injection.  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  Chronic problem with exacerbation.  Discussed the potential different medications.  Patient would like to avoid that if possible.  We discussed the potential for replacement in the near future.  Patient is agreement with the plan.  Follow-up again in 4 to 8 weeks

## 2019-10-04 NOTE — Progress Notes (Deleted)
[facility] [address] [place] [address] Phone: [phone] Subjective:    I'm seeing this patient by the request  of:  [name], MD  CC:   [id number]   07/04/2019 Continued swelling of the left knee noted.  Patient did not respond as well today gentle injection.  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  Chronic problem with exacerbation.  Discussed the potential different medications.  Patient would like to avoid that if possible.  We discussed the potential for replacement in the near future.  Patient is agreement with the plan.  Follow-up again in 4 to 8 weeks  Update 10/08/2019 Alexander Pierce is a 69 y.o. male coming in with complaint of left knee pain. Alycia Rossetti has called patient 3x to be fitted for custom knee brace. Patient states      Past Medical History:  Diagnosis Date  . ALLERGIC RHINITIS 06/29/2007   Qualifier: Diagnosis of  By: Jonny Ruiz MD, Len Blalock   . Allergy    seasonal  . HYPERLIPIDEMIA 06/29/2007   Qualifier: Diagnosis of  By: Jonny Ruiz MD, Len Blalock   . Right inguinal hernia 06/24/2011   Past Surgical History:  Procedure Laterality Date  . INGUINAL HERNIA REPAIR    . sinus surgury  1992   dr Haroldine Laws   Social History   Socioeconomic History  . Marital status: Married    Spouse name: Not on file  . Number of children: Not on file  . Years of education: Not on file  . Highest education level: Not on file  Occupational History  . Not on file  Tobacco Use  . Smoking status: Never Smoker  . Smokeless tobacco: Never Used  Substance and Sexual Activity  . Alcohol use: No  . Drug use: No  . Sexual activity: Not on file  Other Topics Concern  . Not on file  Social History Narrative  . Not on file   Social Determinants of Health   Financial Resource Strain:   . Difficulty of Paying Living Expenses: Not on file  Food Insecurity:   . Worried About Programme researcher, broadcasting/film/video in the Last Year: Not on file  . Ran Out of  Food in the Last Year: Not on file  Transportation Needs:   . Lack of Transportation (Medical): Not on file  . Lack of Transportation (Non-Medical): Not on file  Physical Activity:   . Days of Exercise per Week: Not on file  . Minutes of Exercise per Session: Not on file  Stress:   . Feeling of Stress : Not on file  Social Connections:   . Frequency of Communication with Friends and Family: Not on file  . Frequency of Social Gatherings with Friends and Family: Not on file  . Attends Religious Services: Not on file  . Active Member of Clubs or Organizations: Not on file  . Attends Banker Meetings: Not on file  . Marital Status: Not on file   Allergies  Allergen Reactions  . Acetaminophen     REACTION: eyes swell  . Aspirin     REACTION: eyes swell  . Ibuprofen     REACTION: eyes swell  . Penicillins     REACTION: pt cant remember   Family History  Problem Relation Age of Onset  . Parkinsonism Unknown   . Prostate cancer Unknown   . Hypertension Unknown   . Colon cancer Neg Hx   . Rectal cancer Neg Hx   .  Stomach cancer Neg Hx    No current outpatient medications on file.   Reviewed prior external information including notes and imaging from  primary care provider As well as notes that were available from care everywhere and other healthcare systems.  Past medical history, social, surgical and family history all reviewed in electronic medical record.  No pertanent information unless stated regarding to the chief complaint.   Review of Systems:  No headache, visual changes, nausea, vomiting, diarrhea, constipation, dizziness, abdominal pain, skin rash, fevers, chills, night sweats, weight loss, swollen lymph nodes, body aches, joint swelling, chest pain, shortness of breath, mood changes. POSITIVE muscle aches  Objective  There were no vitals taken for this visit.   General: No apparent distress alert and oriented x3 mood and affect normal, dressed  appropriately.  HEENT: Pupils equal, extraocular movements intact  Respiratory: Patient's speak in full sentences and does not appear short of breath  Cardiovascular: No lower extremity edema, non tender, no erythema  Neuro: Cranial nerves II through XII are intact, neurovascularly intact in all extremities with 2+ DTRs and 2+ pulses.  Gait normal with good balance and coordination.  MSK:  Non tender with full range of motion and good stability and symmetric strength and tone of shoulders, elbows, wrist, hip, knee and ankles bilaterally.     Impression and Recommendations:     The above documentation has been reviewed and is accurate and complete Wilford Grist       Note: This dictation was prepared with Dragon dictation along with smaller phrase technology. Any transcriptional errors that result from this process are unintentional.

## 2019-10-08 ENCOUNTER — Ambulatory Visit: Payer: BC Managed Care – PPO | Admitting: Family Medicine

## 2019-11-28 ENCOUNTER — Encounter: Payer: BC Managed Care – PPO | Admitting: Internal Medicine

## 2020-04-17 NOTE — Progress Notes (Addendum)
[facility] [address] [place] [address] Phone: [phone] Subjective:    I'm seeing this patient by the request  of:  [name], MD  CC: Left knee pain  APO:[id number]   07/04/2019 Continued swelling of the left knee noted.  Patient did not respond as well today gentle injection.  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  Chronic problem with exacerbation.  Discussed the potential different medications.  Patient would like to avoid that if possible.  We discussed the potential for replacement in the near future.  Patient is agreement with the plan.  Follow-up again in 4 to 8 weeks  Update 04/18/2020 Alexander [facility] is a 70 y.o. male coming in with complaint of B knee pain. Patient states L knee is swollen and he suspects fluid on the knee. Pt reports R knee isn't too bad.  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  Having increasing swelling and discomfort again.  Patient last time had aspiration was in June.  Patient in the past and did do very well with the viscosupplementation.  Patient feels that if he could have been potentially done again and can buy him more time before he would need a knee replacement.  Patient was given viscosupplementation in April of the previous year.       Past Medical History:  Diagnosis Date  . ALLERGIC RHINITIS 06/29/2007   Qualifier: Diagnosis of  By: Jonny Ruiz MD, Len Blalock   . Allergy    seasonal  . HYPERLIPIDEMIA 06/29/2007   Qualifier: Diagnosis of  By: Jonny Ruiz MD, Len Blalock   . Right inguinal hernia 06/24/2011   Past Surgical History:  Procedure Laterality Date  . INGUINAL HERNIA REPAIR    . sinus surgury  1992   dr Haroldine Laws   Social History   Socioeconomic History  . Marital status: Married    Spouse name: Not on file  . Number of children: Not on file  . Years of education: Not on file  . Highest education level: Not on file  Occupational History  . Not on file  Tobacco  Use  . Smoking status: Never Smoker  . Smokeless tobacco: Never Used  Substance and Sexual Activity  . Alcohol use: No  . Drug use: No  . Sexual activity: Not on file  Other Topics Concern  . Not on file  Social History Narrative  . Not on file   Social Determinants of Health   Financial Resource Strain: Not on file  Food Insecurity: Not on file  Transportation Needs: Not on file  Physical Activity: Not on file  Stress: Not on file  Social Connections: Not on file   Allergies  Allergen Reactions  . Acetaminophen     REACTION: eyes swell  . Aspirin     REACTION: eyes swell  . Ibuprofen     REACTION: eyes swell  . Penicillins     REACTION: pt cant remember   Family History  Problem Relation Age of Onset  . Parkinsonism Unknown   . Prostate cancer Unknown   . Hypertension Unknown   . Colon cancer Neg Hx   . Rectal cancer Neg Hx   . Stomach cancer Neg Hx    No current outpatient medications on file.   Reviewed prior external information including notes and imaging from  primary care provider As well as notes that were available from care everywhere and other healthcare systems.  Past medical history, social, surgical and  family history all reviewed in electronic medical record.  No pertanent information unless stated regarding to the chief complaint.   Review of Systems:  No headache, visual changes, nausea, vomiting, diarrhea, constipation, dizziness, abdominal pain, skin rash, fevers, chills, night sweats, weight loss, swollen lymph nodes, body aches, joint swelling, chest pain, shortness of breath, mood changes. POSITIVE muscle aches  Objective  Blood pressure (!) 148/98, pulse 75, height 6\' 5"  (1.956 m), weight 218 lb (98.9 kg), SpO2 95 %.   General: No apparent distress alert and oriented x3 mood and affect normal, dressed appropriately.  HEENT: Pupils equal, extraocular movements intact  Respiratory: Patient's speak in full sentences and does not appear  short of breath  Cardiovascular: No lower extremity edema, non tender, no erythema  Gait antalgic gait noted Left knee has significant swelling.  Lacks the last 15 degrees of flexion.  No signs of erythema.  Instability with valgus and varus force noted.  Tender to palpation diffusely.  Procedure: Real-time Ultrasound Guided Injection of left knee Device: GE Logiq Q7 Ultrasound guided injection is preferred based studies that show increased duration, increased effect, greater accuracy, decreased procedural pain, increased response rate, and decreased cost with ultrasound guided versus blind injection.  Verbal informed consent obtained.  Time-out conducted.  Noted no overlying erythema, induration, or other signs of local infection.  Skin prepped in a sterile fashion.  Local anesthesia: Topical Ethyl chloride.  With sterile technique and under real time ultrasound guidance: With a 22-gauge 2 inch needle patient was injected with 4 cc of 0.5% Marcaine and aspirated 80 cc of straw-colored fluid then injected 1 cc of Kenalog 40 mg/dL. This was from a superior lateral approach.  Completed without difficulty  Pain immediately resolved suggesting accurate placement of the medication.  Advised to call if fevers/chills, erythema, induration, drainage, or persistent bleeding.  Impression: Technically successful ultrasound guided injection.    Impression and Recommendations:     The above documentation has been reviewed and is accurate and complete , DO

## 2020-04-18 ENCOUNTER — Ambulatory Visit: Payer: Self-pay

## 2020-04-18 ENCOUNTER — Other Ambulatory Visit: Payer: Self-pay

## 2020-04-18 ENCOUNTER — Encounter: Payer: Self-pay | Admitting: Family Medicine

## 2020-04-18 ENCOUNTER — Ambulatory Visit: Payer: BC Managed Care – PPO | Admitting: Family Medicine

## 2020-04-18 VITALS — BP 148/98 | HR 75 | Ht 77.0 in | Wt 218.0 lb

## 2020-04-18 DIAGNOSIS — M170 Bilateral primary osteoarthritis of knee: Secondary | ICD-10-CM

## 2020-04-18 DIAGNOSIS — G8929 Other chronic pain: Secondary | ICD-10-CM

## 2020-04-18 DIAGNOSIS — M25562 Pain in left knee: Secondary | ICD-10-CM | POA: Diagnosis not present

## 2020-04-18 NOTE — Assessment & Plan Note (Addendum)
Chronic problem with exacerbation.  Drained over 80 cc of straw-colored fluid.  Discussed with patient reduces OA stability brace.  Discussed which activities to do which wants to avoid.  Discussed compression.  Follow-up with me again in 4 to 8 weeks patient has had viscosupplementation previously with intermittent improvement we will get approval just in case.  Patient did have improvement for nearly a whole year with the viscosupplementation and help decrease the amount of over-the-counter medications he was using.  Once again follow-up in 2 months

## 2020-04-18 NOTE — Patient Instructions (Signed)
Thank you for coming in today.  I drained some fluid off your left knee today. And did a steroid injection. Call or go to the ER if you develop a large red swollen joint with extreme pain or oozing puss.   Use brace with activity.  Follow up with me in 2 months if needed.

## 2020-04-19 ENCOUNTER — Encounter (HOSPITAL_BASED_OUTPATIENT_CLINIC_OR_DEPARTMENT_OTHER): Payer: Self-pay | Admitting: Emergency Medicine

## 2020-04-19 ENCOUNTER — Emergency Department (HOSPITAL_BASED_OUTPATIENT_CLINIC_OR_DEPARTMENT_OTHER): Payer: BC Managed Care – PPO

## 2020-04-19 ENCOUNTER — Emergency Department (HOSPITAL_BASED_OUTPATIENT_CLINIC_OR_DEPARTMENT_OTHER)
Admission: EM | Admit: 2020-04-19 | Discharge: 2020-04-19 | Disposition: A | Discharge status: Home / Self Care | Payer: BC Managed Care – PPO | Attending: Emergency Medicine | Admitting: Emergency Medicine

## 2020-04-19 ENCOUNTER — Emergency Department (HOSPITAL_COMMUNITY): Payer: BC Managed Care – PPO

## 2020-04-19 ENCOUNTER — Other Ambulatory Visit: Payer: Self-pay

## 2020-04-19 DIAGNOSIS — R110 Nausea: Secondary | ICD-10-CM | POA: Insufficient documentation

## 2020-04-19 DIAGNOSIS — R42 Dizziness and giddiness: Secondary | ICD-10-CM | POA: Insufficient documentation

## 2020-04-19 LAB — CBC WITH DIFFERENTIAL/PLATELET
Abs Immature Granulocytes: 0.07 10*3/uL (ref 0.00–0.07)
Basophils Absolute: 0 10*3/uL (ref 0.0–0.1)
Basophils Relative: 0 %
Eosinophils Absolute: 0 10*3/uL (ref 0.0–0.5)
Eosinophils Relative: 0 %
HCT: 39.5 % (ref 39.0–52.0)
Hemoglobin: 13.4 g/dL (ref 13.0–17.0)
Immature Granulocytes: 1 %
Lymphocytes Relative: 12 %
Lymphs Abs: 1.5 10*3/uL (ref 0.7–4.0)
MCH: 30.2 pg (ref 26.0–34.0)
MCHC: 33.9 g/dL (ref 30.0–36.0)
MCV: 89 fL (ref 80.0–100.0)
Monocytes Absolute: 0.8 10*3/uL (ref 0.1–1.0)
Monocytes Relative: 7 %
Neutro Abs: 9.7 10*3/uL — ABNORMAL HIGH (ref 1.7–7.7)
Neutrophils Relative %: 80 %
Platelets: 273 10*3/uL (ref 150–400)
RBC: 4.44 MIL/uL (ref 4.22–5.81)
RDW: 13.2 % (ref 11.5–15.5)
WBC: 12.1 10*3/uL — ABNORMAL HIGH (ref 4.0–10.5)
nRBC: 0 % (ref 0.0–0.2)

## 2020-04-19 LAB — BASIC METABOLIC PANEL
Anion gap: 9 (ref 5–15)
BUN: 19 mg/dL (ref 8–23)
CO2: 24 mmol/L (ref 22–32)
Calcium: 8.2 mg/dL — ABNORMAL LOW (ref 8.9–10.3)
Chloride: 101 mmol/L (ref 98–111)
Creatinine, Ser: 0.85 mg/dL (ref 0.61–1.24)
GFR, Estimated: >60 mL/min (ref >60)
Glucose, Bld: 136 mg/dL — ABNORMAL HIGH (ref 70–99)
Potassium: 3.8 mmol/L (ref 3.5–5.1)
Sodium: 134 mmol/L — ABNORMAL LOW (ref 135–145)

## 2020-04-19 IMAGING — CT CT HEAD W/O CM
2 series · 15 of 30 positions shown, 17 images · non-contrast
Comparison: None.

CLINICAL DATA: Dizziness

Nausea
Vomiting
EXAM:
CT HEAD WITHOUT CONTRAST
TECHNIQUE: Contiguous axial images were obtained from the base of the skull
through the vertex without intravenous contrast.

[Series 2: head wo · axial · 0.46mm/px · z∈[-466,-321]mm · 7 of 39 slices shown, 9 images]
[im 5/39  brain]
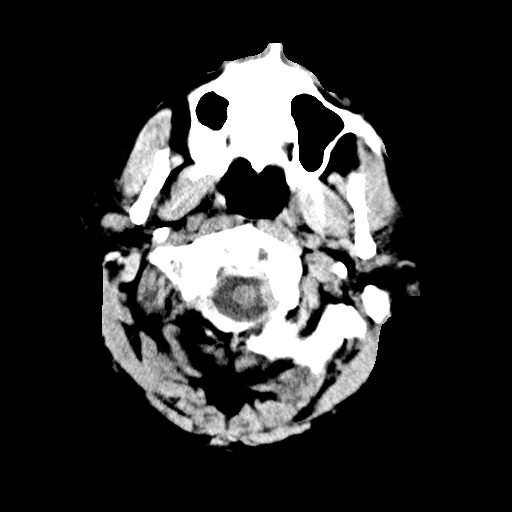
[im 5/39  bone]
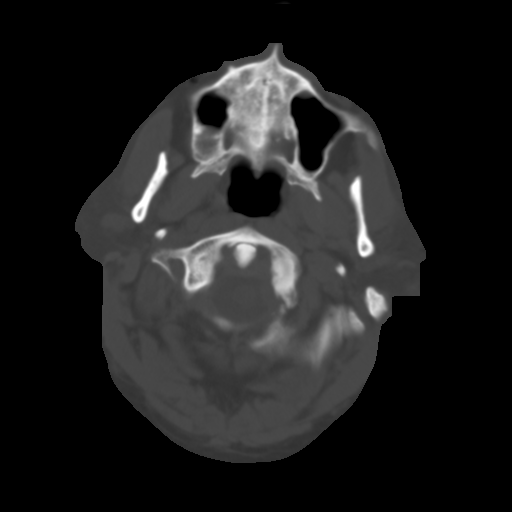
[im 10/39  brain]
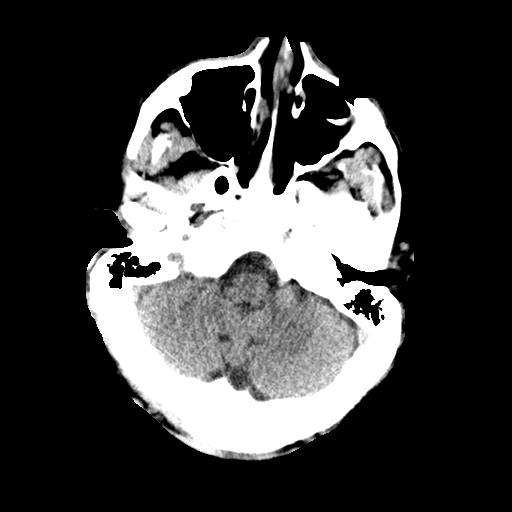
[im 15/39  brain]
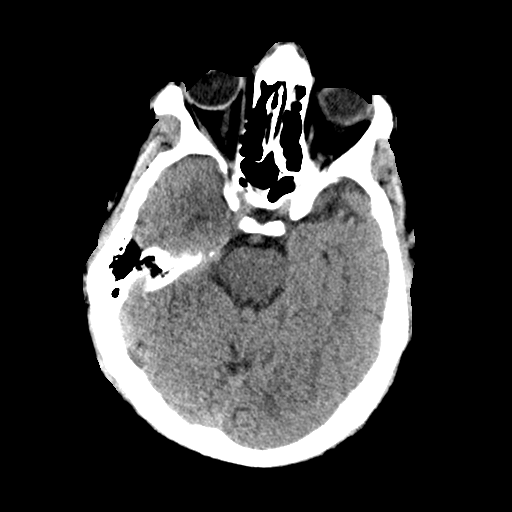
[im 20/39  brain]
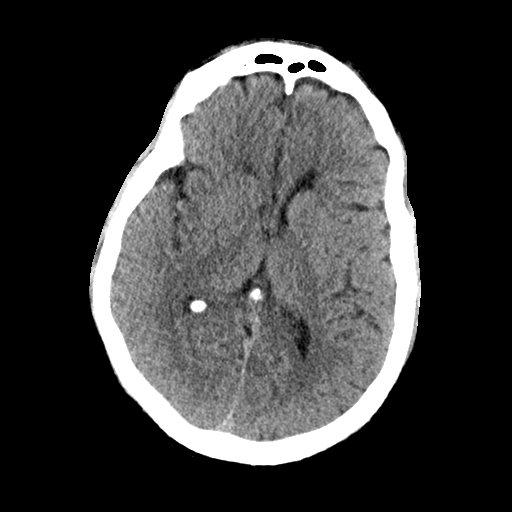
[im 24/39  brain]
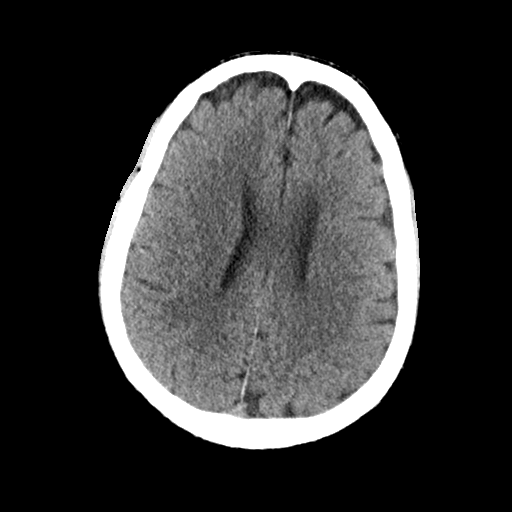
[im 24/39  bone]
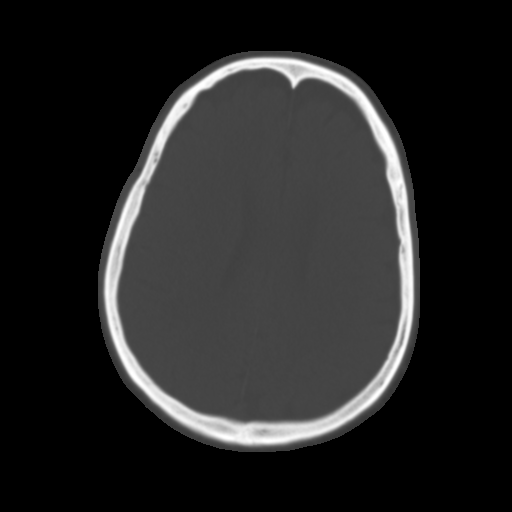
[im 29/39  brain]
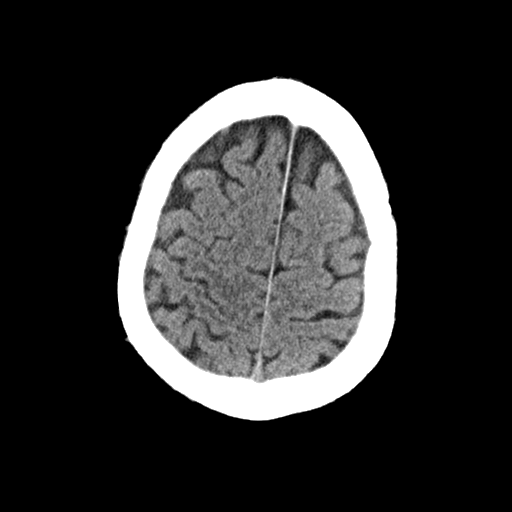
[im 34/39  brain]
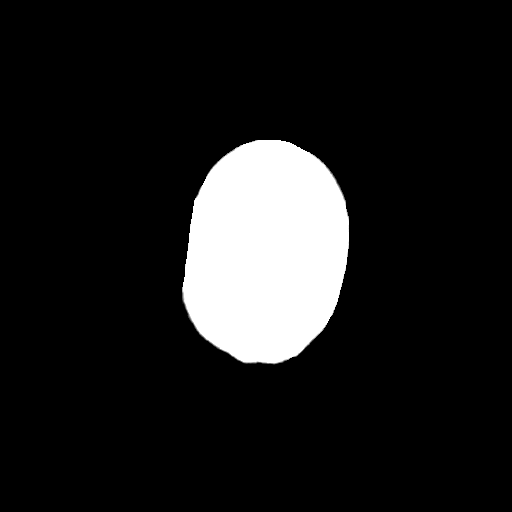

[Series 3: head bone · axial · 0.46mm/px · z∈[-468,-314]mm · 8 of 97 slices shown]
[im 10/97  bone]
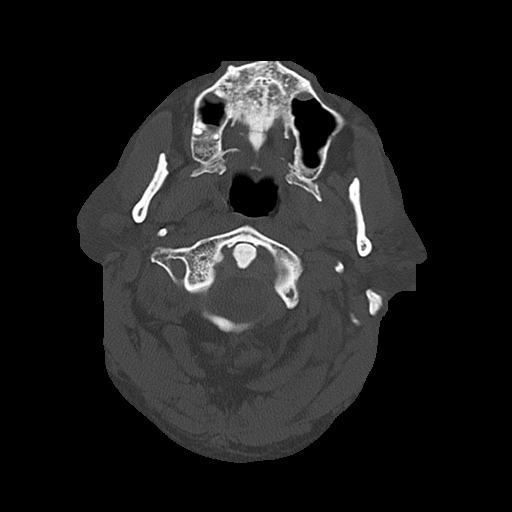
[im 20/97  bone]
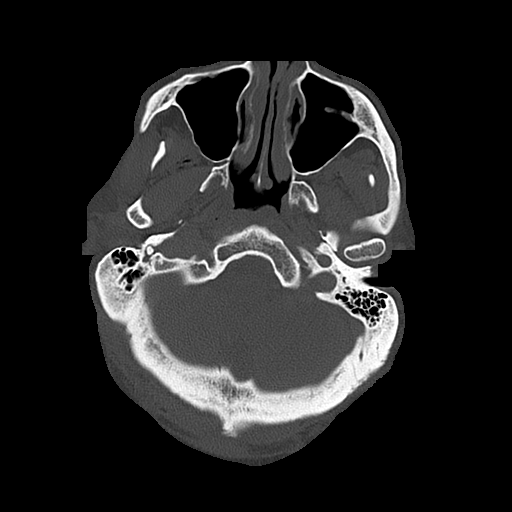
[im 29/97  bone]
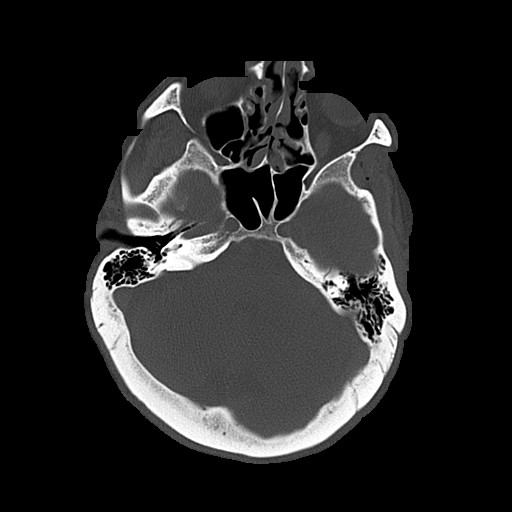
[im 44/97  bone]
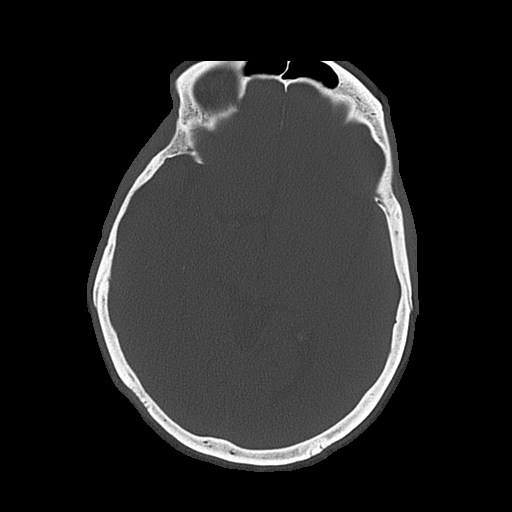
[im 53/97  bone]
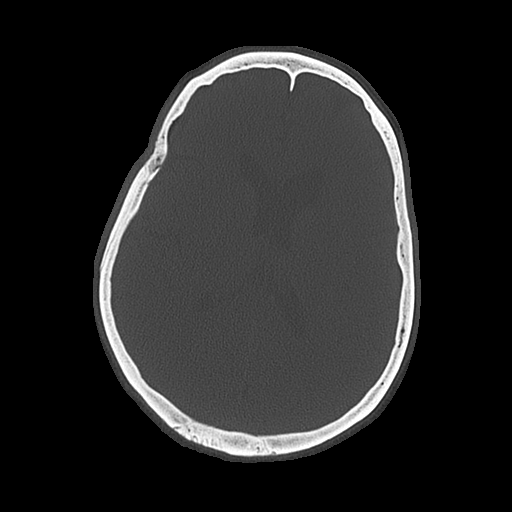
[im 68/97  bone]
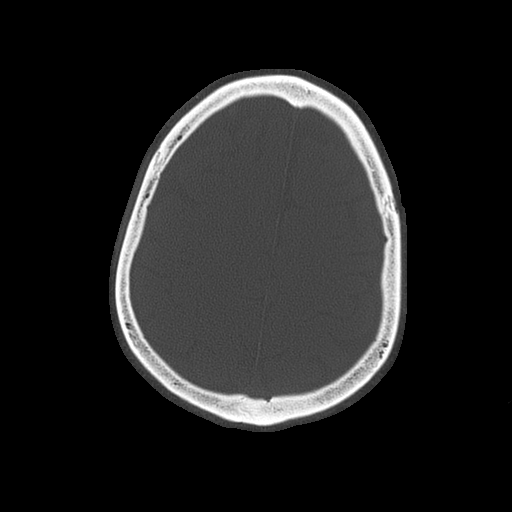
[im 77/97  bone]
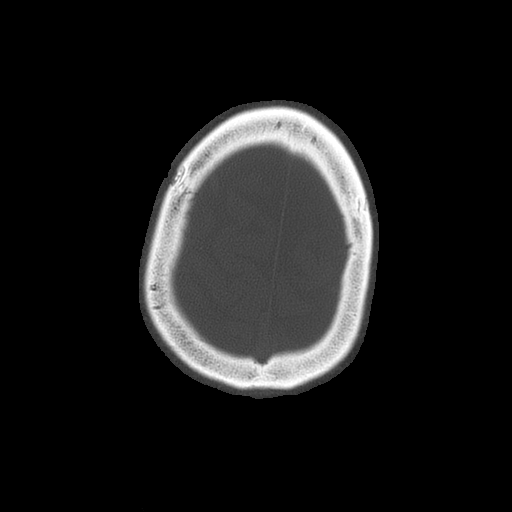
[im 87/97  bone]
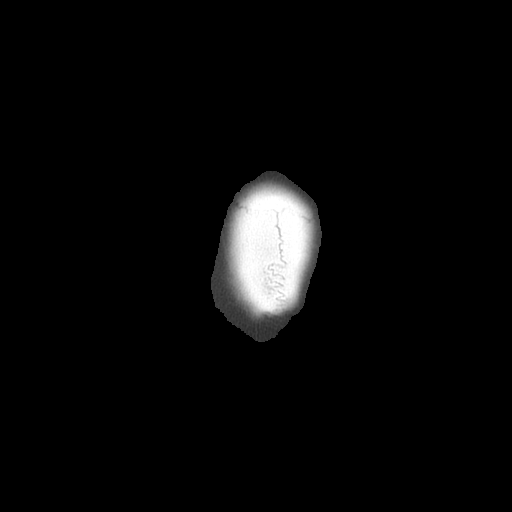

[15 of 30 positions shown; findings below may reference images not displayed]

FINDINGS: Brain: No evidence of acute infarction, hemorrhage, hydrocephalus,
extra-axial collection or mass lesion/mass effect.

Vascular: No hyperdense vessel or unexpected calcification.

Skull: Normal. Negative for fracture or focal lesion.

Sinuses/Orbits: Postsurgical changes of the maxillary sinuses are
seen. There is partial opacification of the ethmoid air cells and
maxillary sinuses. There is periapical lucency surrounding the first
right maxillary molar.

Other: None.
IMPRESSION: No acute intracranial abnormality.

## 2020-04-19 IMAGING — MR MR HEAD W/O CM
8 of 10 series · 36 of 48 positions shown · non-contrast
Comparison: CT head without contrast [DATE]. CTA head neck
[DATE].

CLINICAL DATA: Dizziness.  Nausea.

EXAM:
MRI HEAD WITHOUT CONTRAST
TECHNIQUE: Multiplanar, multiecho pulse sequences of the brain and surrounding
structures were obtained without intravenous contrast.

[Series 3: DWI · axial · 3.0mm · 1.09mm/px · z∈[-66,+83]mm · 9 of 102 slices shown (1 of 4)]
[im 1/102]
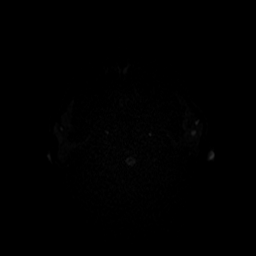
[im 13/102]
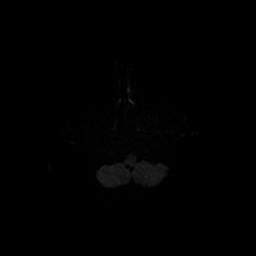
[im 26/102]
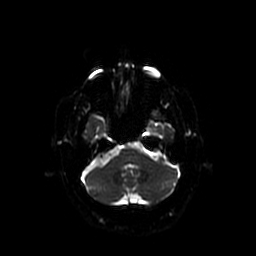
[im 38/102]
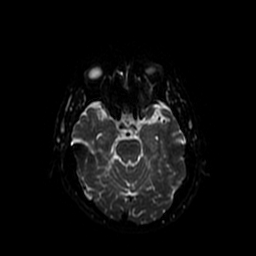
[im 51/102]
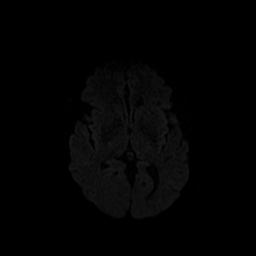
[im 64/102]
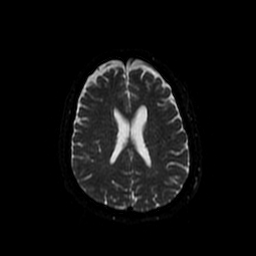
[im 76/102]
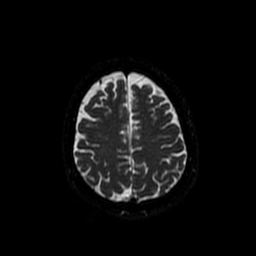
[im 89/102]
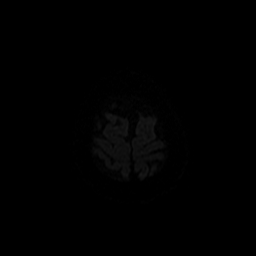
[im 102/102]
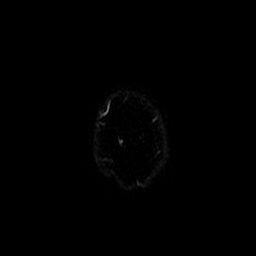

[Series 4: DWI · coronal · 5.0mm · 1.09mm/px · 7 of 72 slices shown (2 of 4)]
[im 1/72]
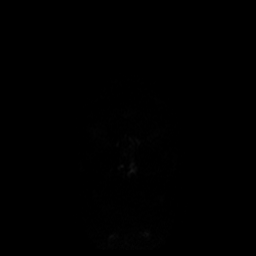
[im 12/72]
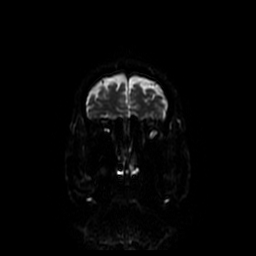
[im 24/72]
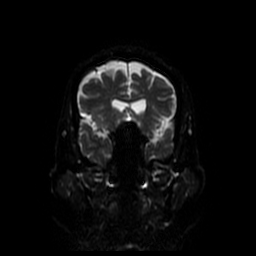
[im 36/72]
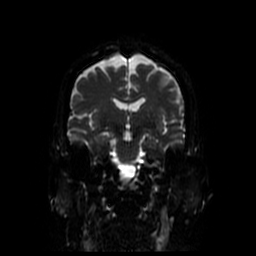
[im 48/72]
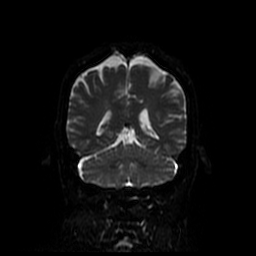
[im 60/72]
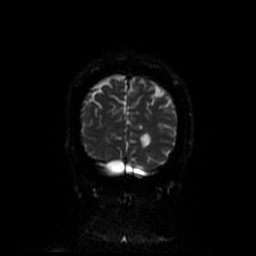
[im 72/72]
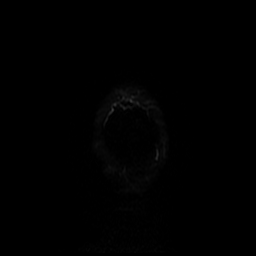

[Series 5: FLAIR · axial · 3.0mm · 0.43mm/px · z∈[-69,+80]mm · 3 of 26 slices shown]
[im 1/26]
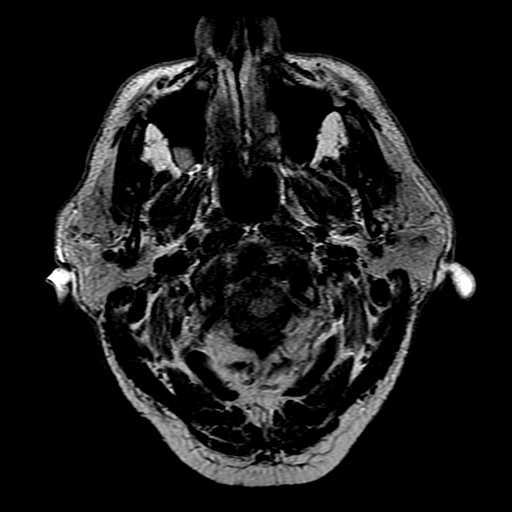
[im 13/26]
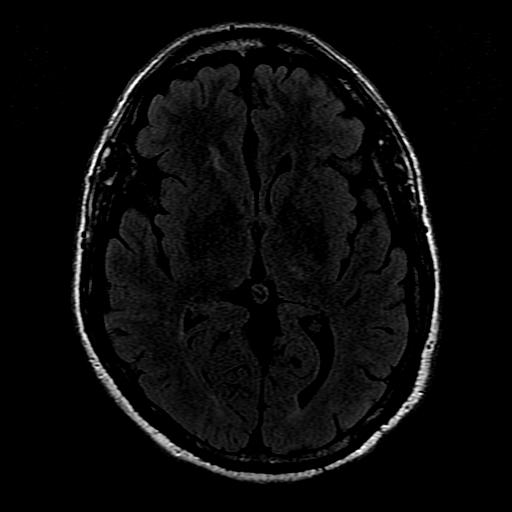
[im 26/26]
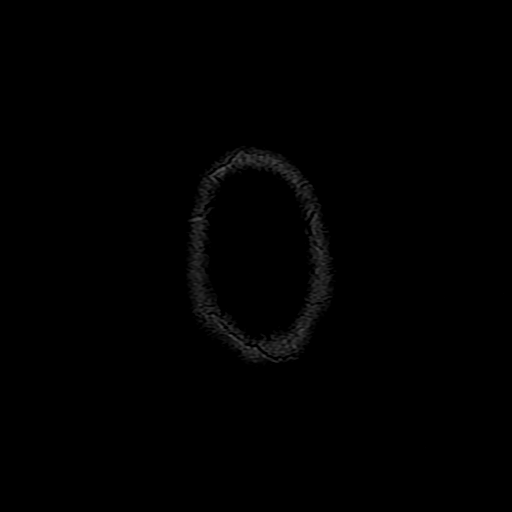

[Series 7: T1 · sagittal · 5.0mm · 0.47mm/px · 2 of 25 slices shown]
[im 1/25]
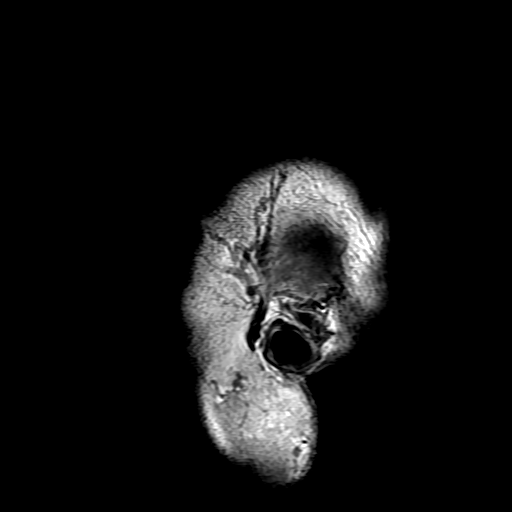
[im 25/25]
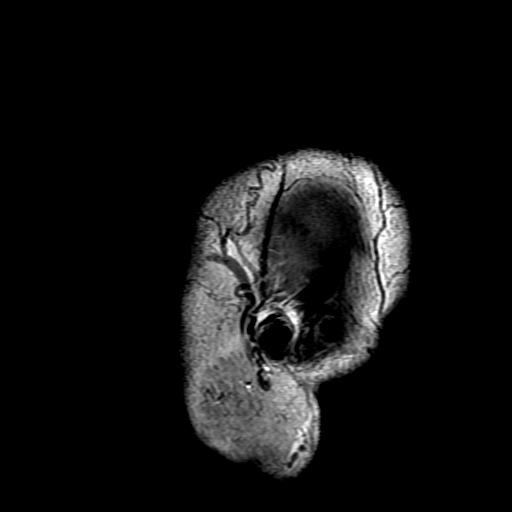

[Series 8: T2 · axial · 5.0mm · 0.43mm/px · z∈[-69,+80]mm · 3 of 26 slices shown (1 of 2)]
[im 1/26]
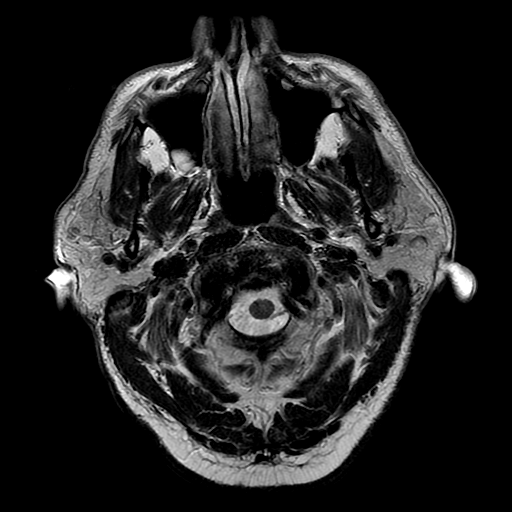
[im 13/26]
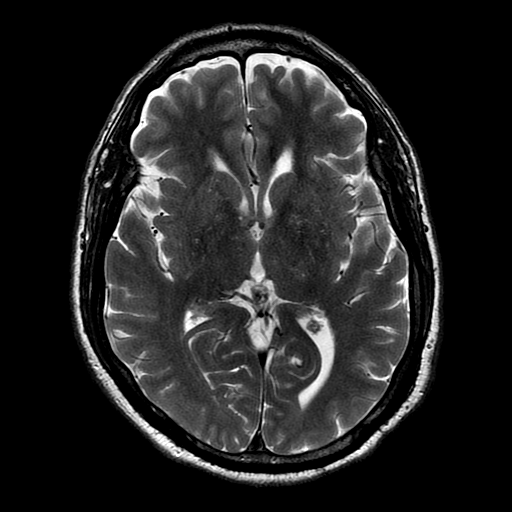
[im 26/26]
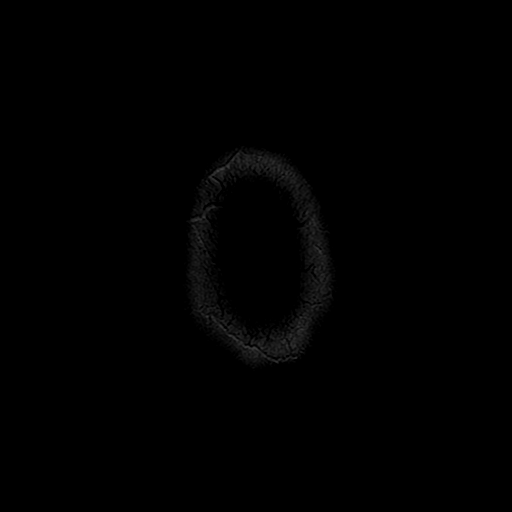

[Series 10: T2 · coronal · 5.0mm · 0.39mm/px · 3 of 28 slices shown (2 of 2)]
[im 1/28]
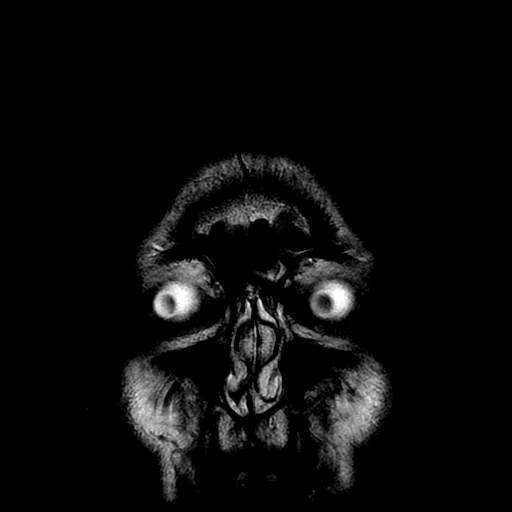
[im 14/28]
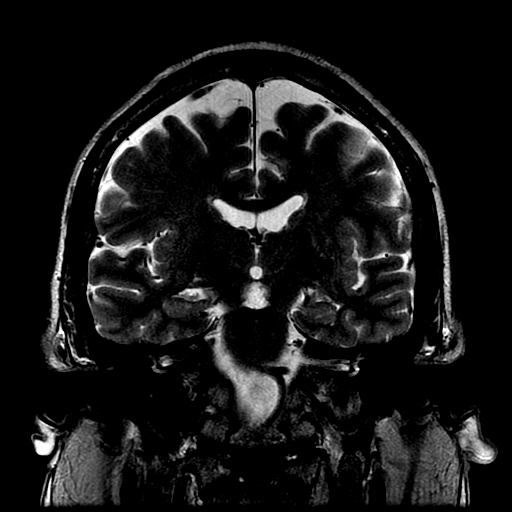
[im 28/28]
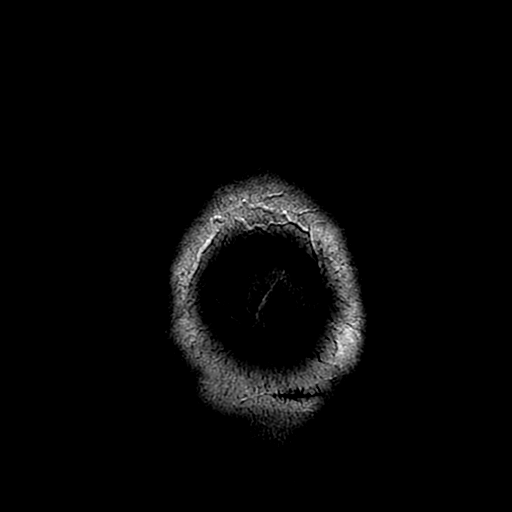

[Series 300: DWI · axial · 3.0mm · 1.09mm/px · z∈[-66,+83]mm · 5 of 51 slices shown (3 of 4)]
[im 1/51]
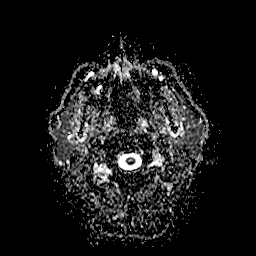
[im 13/51]
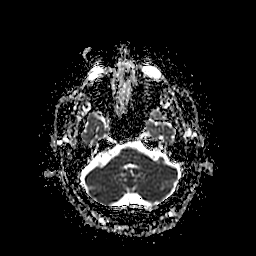
[im 26/51]
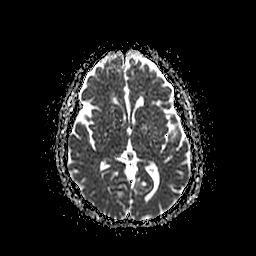
[im 38/51]
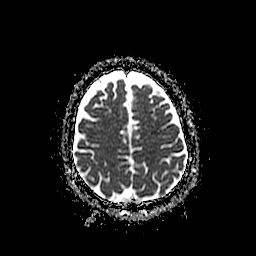
[im 51/51]
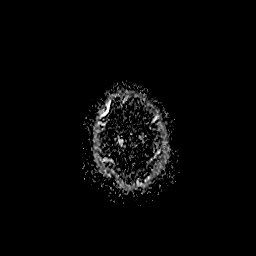

[Series 400: DWI · coronal · 5.0mm · 1.09mm/px · 4 of 36 slices shown (4 of 4)]
[im 1/36]
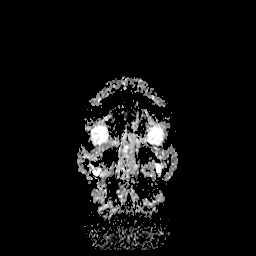
[im 12/36]
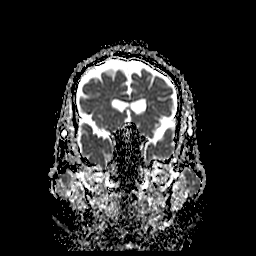
[im 24/36]
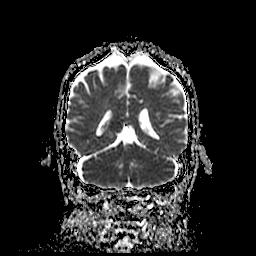
[im 36/36]
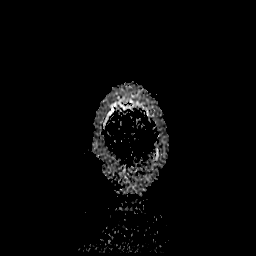

[36 of 48 positions shown; findings below may reference images not displayed]

FINDINGS: Brain: No acute infarct, hemorrhage, or mass lesion is present.
Minimal periventricular white matter changes are likely within
normal limits for age. The ventricles are of normal size. Basal
ganglia are within normal limits. No significant extraaxial fluid
collection is present.

The internal auditory canals are within normal limits. The brainstem
and cerebellum are within normal limits.

Vascular: Flow is present in the major intracranial arteries.

Skull and upper cervical spine: The craniocervical junction is
normal. Upper cervical spine is within normal limits. Marrow signal
is unremarkable.

Sinuses/Orbits: Mild mucosal thickening is present within anterior
ethmoid air cells bilaterally. Frontal sinuses are clear. The
paranasal sinuses and mastoid air cells are otherwise clear. The
globes and orbits are within normal limits.
IMPRESSION: 1. Normal MRI appearance of the brain for age.
2. Mild anterior ethmoid sinus disease.

## 2020-04-19 IMAGING — CT CT ANGIO NECK
2 of 6 series · 8 of 27 positions shown · IV contrast (APPLIED)
Comparison: CT head without contrast [DATE]

CLINICAL DATA: Dizziness.  Nausea and vomiting.

EXAM:
CT ANGIOGRAPHY HEAD AND NECK
TECHNIQUE: Multidetector CT imaging of the head and neck was performed using
the standard protocol during bolus administration of intravenous
contrast. Multiplanar CT image reconstructions and MIPs were
obtained to evaluate the vascular anatomy. Carotid stenosis
measurements (when applicable) are obtained utilizing NASCET
criteria, using the distal internal carotid diameter as the
denominator.
CONTRAST:  100mL OMNIPAQUE IOHEXOL 350 MG/ML SOLN

[Series 5: cta head & neck · axial · 0.40mm/px · z∈[-759,-482]mm · 6 of 777 slices shown]
[im 111/777  soft-tissue]
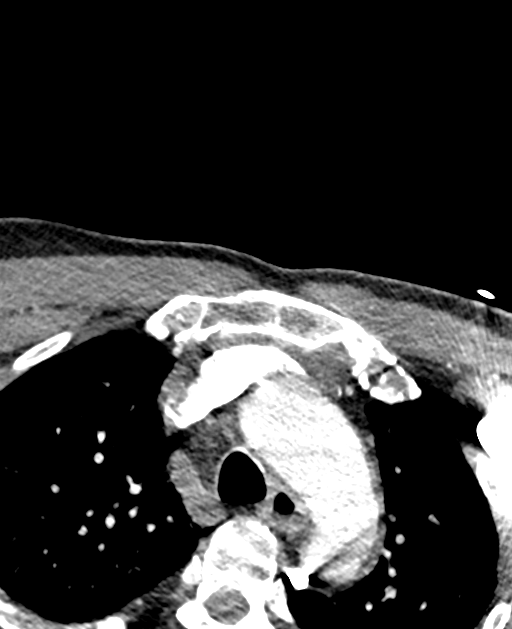
[im 222/777  bone]
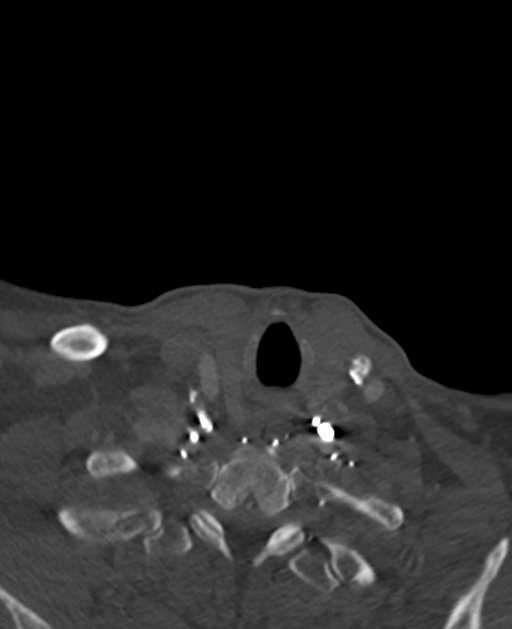
[im 333/777  soft-tissue]
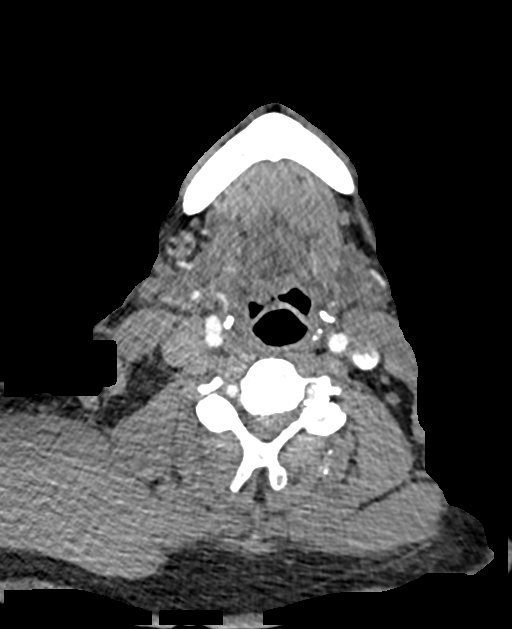
[im 444/777  bone]
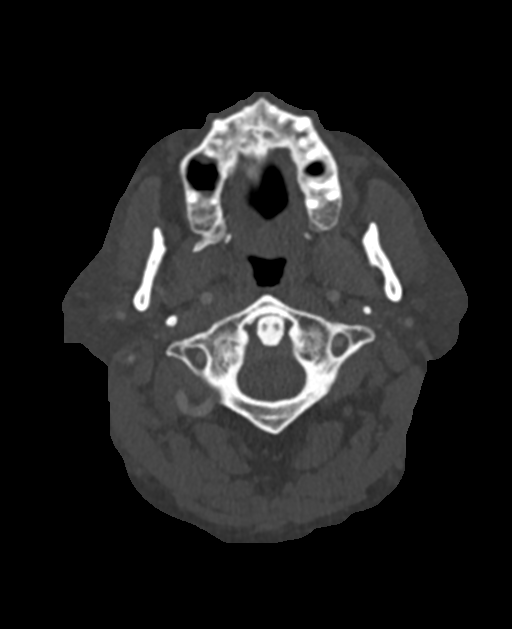
[im 555/777  soft-tissue]
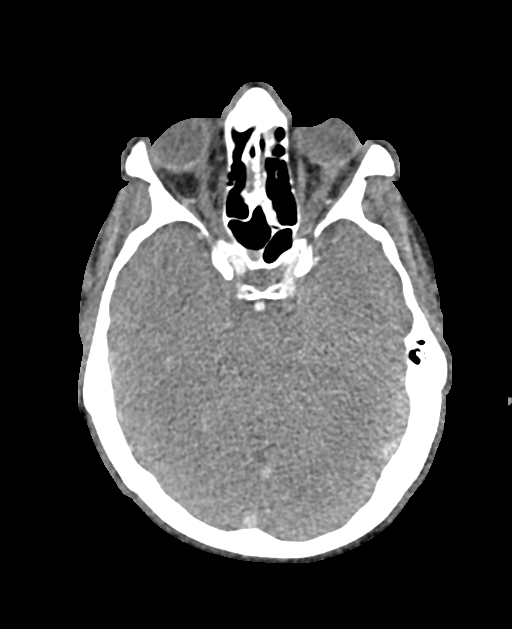
[im 666/777  bone]
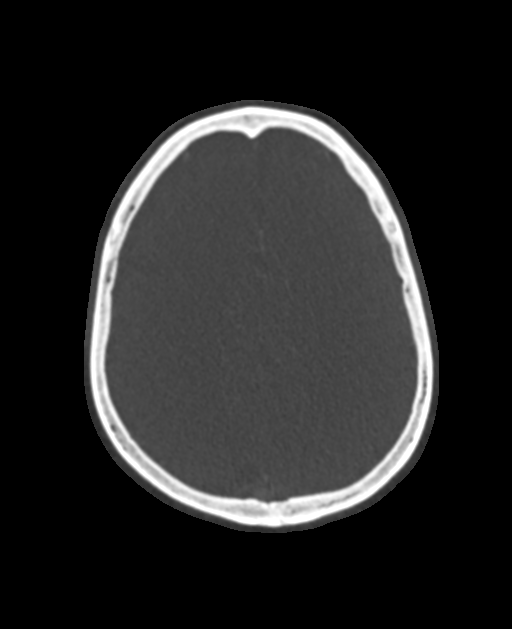

[Series 12: ax thin · axial · 0.40mm/px · z∈[-686,-558]mm · 2 of 386 slices shown]
[im 129/386  soft-tissue]
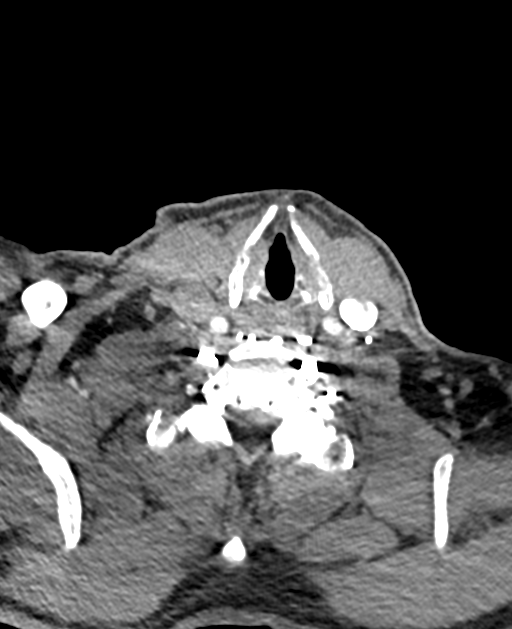
[im 257/386  soft-tissue]
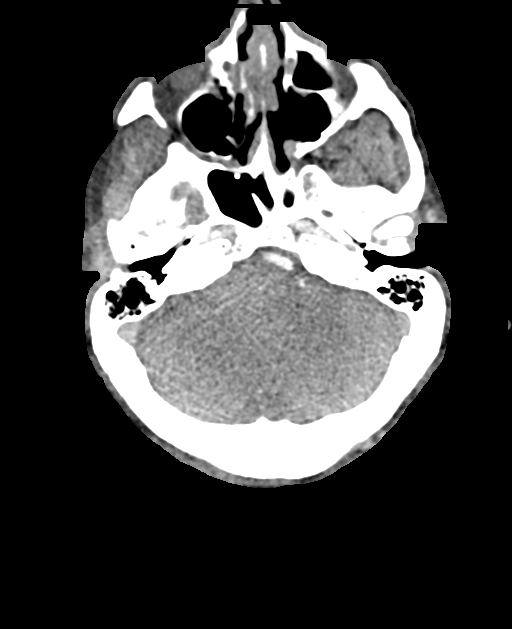

[8 of 27 positions shown; findings below may reference images not displayed]

FINDINGS: CTA NECK FINDINGS

Aortic arch: Atherosclerotic calcifications scratched at 3 vessel
arch configuration is present. No significant stenosis or aneurysm
is present. Mild atherosclerotic calcifications are present in the
arch beyond the great vessel origins.

Right carotid system: Right common carotid artery is within normal
limits. Atherosclerotic changes are present at the bifurcation and
proximal right ICA without significant stenosis. Cervical right ICA
is normal.

Left carotid system: The left common carotid artery demonstrates
some tortuosity without significant stenosis. Bifurcation is
unremarkable. Cervical left ICA is within normal limits.

Vertebral arteries: The vertebral arteries are codominant. Both
vertebral arteries originate from the subclavian arteries without
significant stenosis. No significant stenosis is present in either
vertebral artery in the neck.

Skeleton: Vertebral body heights are normal. No significant
listhesis is present. Straightening of the cervical lordosis noted.
Fusion present at C5-6. Foraminal narrowing is worse right than left
at C4-5, C5-6, and C6-7.

Other neck: Soft tissues of the neck are otherwise unremarkable.

Upper chest: The lung apices are clear. Thoracic inlet is within
normal limits.

Review of the MIP images confirms the above findings

CTA HEAD FINDINGS

Anterior circulation: The internal carotid arteries are within
normal limits from the skull base through the ICA termini. Mild
narrowing is present in proximal left A1 segment. M1 segments are
within normal limits bilaterally. Few segmental narrowing present
throughout the ACA and MCA branch vessels bilaterally without a
significant proximal occlusion focal stenosis.

Posterior circulation: The vertebral arteries are codominant. PICA
origins are visualized and normal. Vertebrobasilar junction is
normal. Basilar artery is within normal limits. Both posterior
cerebral arteries originate from the basilar tip. The PCA branch
vessels demonstrate some distal attenuation a significant proximal
stenosis or occlusion.

Venous sinuses: Dural sinuses are patent. Straight sinus deep
cerebral veins are intact. Cortical veins are unremarkable.

Anatomic variants: None

Review of the MIP images confirms the above findings
IMPRESSION: 1. No emergent large vessel occlusion.
2. Mild narrowing of the proximal left A1 segment.
3. Diffuse segmental narrowing of the ACA, MCA, and PCA branch
vessels consistent with intracranial atherosclerotic disease. No
significant branch vessel occlusion or aneurysm.
4. Mild atherosclerotic changes at the right carotid bifurcation and
proximal right ICA without significant stenosis.
5. Multilevel spondylosis of the cervical spine with fusion at C5-6.
6. Aortic Atherosclerosis ([ZG]-[ZG]).

## 2020-04-19 IMAGING — CT CT ANGIO HEAD
2 of 6 series · 8 of 27 positions shown · IV contrast (APPLIED)
Comparison: CT head without contrast [DATE]

CLINICAL DATA: Dizziness.  Nausea and vomiting.

EXAM:
CT ANGIOGRAPHY HEAD AND NECK
TECHNIQUE: Multidetector CT imaging of the head and neck was performed using
the standard protocol during bolus administration of intravenous
contrast. Multiplanar CT image reconstructions and MIPs were
obtained to evaluate the vascular anatomy. Carotid stenosis
measurements (when applicable) are obtained utilizing NASCET
criteria, using the distal internal carotid diameter as the
denominator.
CONTRAST:  100mL OMNIPAQUE IOHEXOL 350 MG/ML SOLN

[Series 505: cta head & neck · axial · 0.40mm/px · z∈[-759,-482]mm · 6 of 777 slices shown]
[im 111/777  soft-tissue]
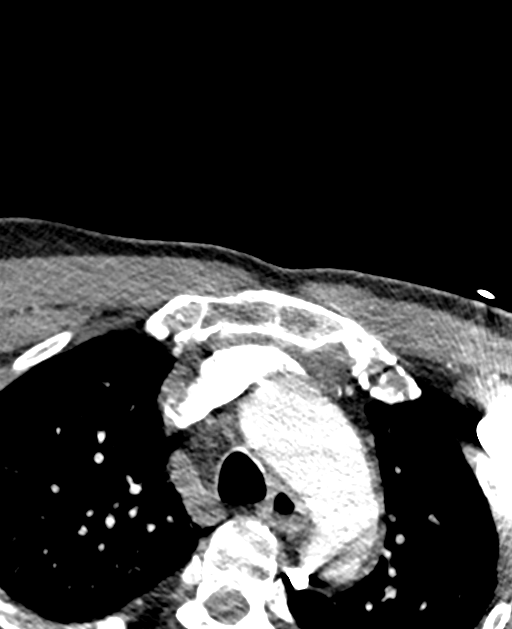
[im 222/777  bone]
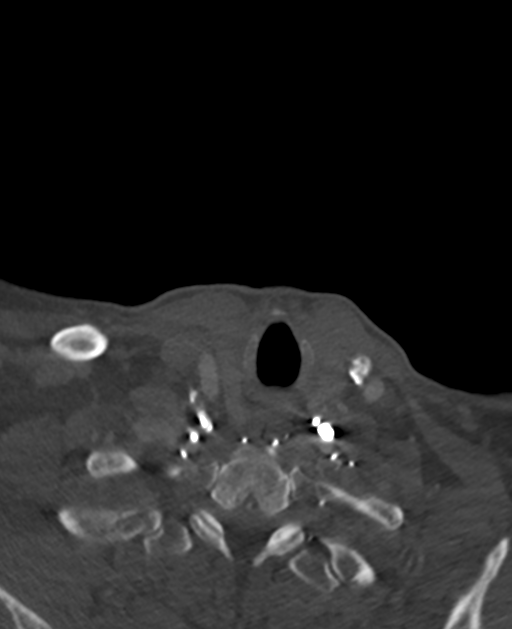
[im 333/777  soft-tissue]
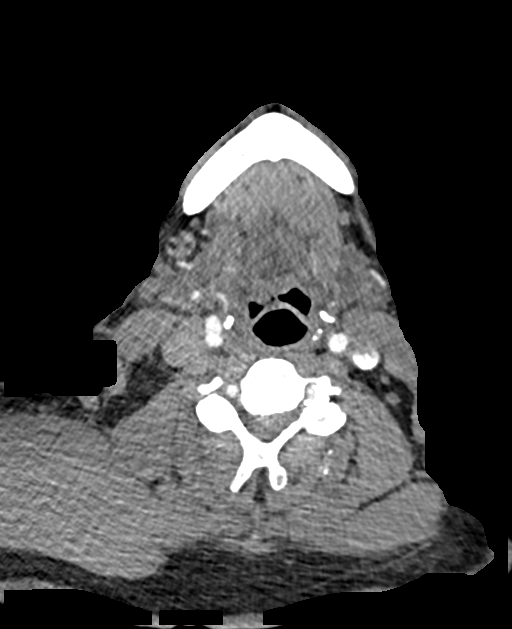
[im 444/777  bone]
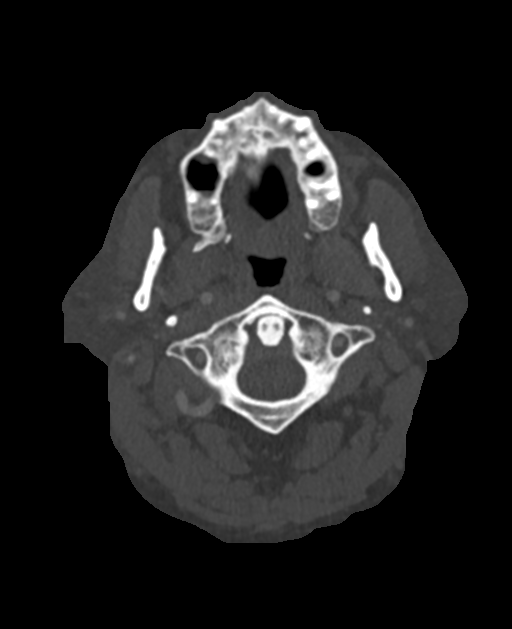
[im 555/777  soft-tissue]
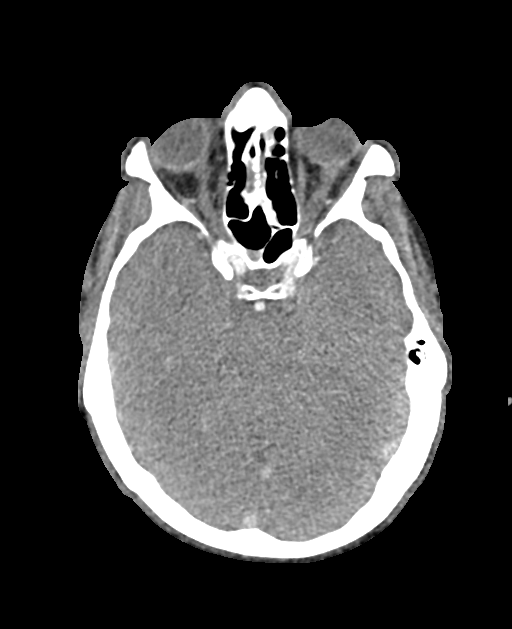
[im 666/777  bone]
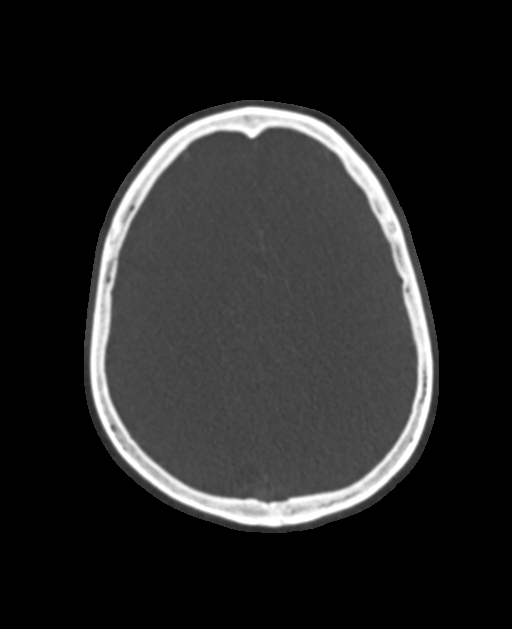

[Series 511: ax thin · axial · 0.40mm/px · z∈[-686,-558]mm · 2 of 386 slices shown]
[im 129/386  soft-tissue]
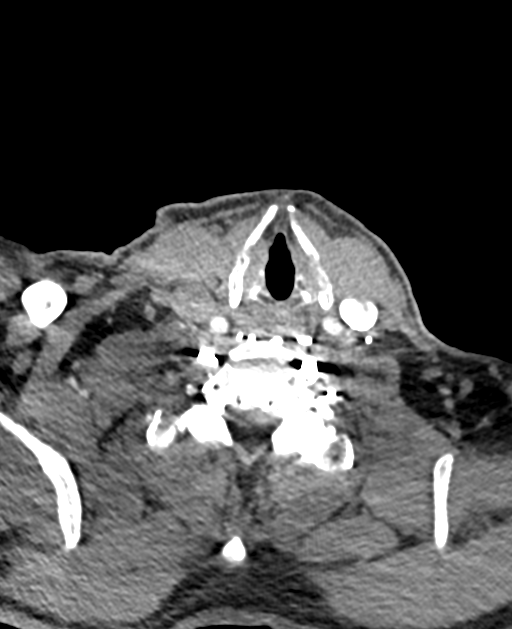
[im 257/386  soft-tissue]
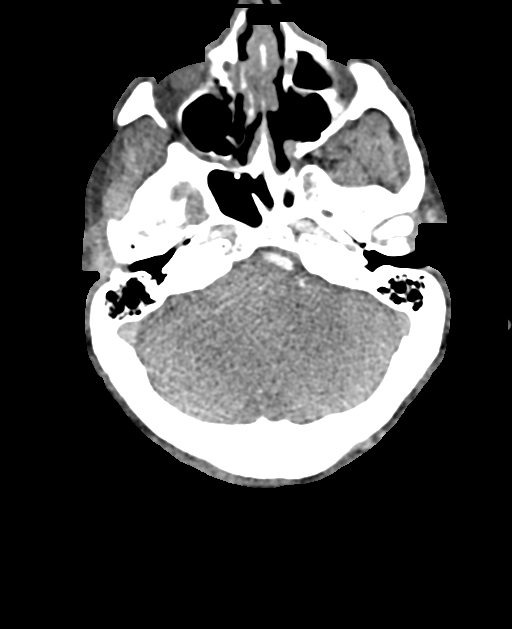

[8 of 27 positions shown; findings below may reference images not displayed]

FINDINGS: CTA NECK FINDINGS

Aortic arch: Atherosclerotic calcifications scratched at 3 vessel
arch configuration is present. No significant stenosis or aneurysm
is present. Mild atherosclerotic calcifications are present in the
arch beyond the great vessel origins.

Right carotid system: Right common carotid artery is within normal
limits. Atherosclerotic changes are present at the bifurcation and
proximal right ICA without significant stenosis. Cervical right ICA
is normal.

Left carotid system: The left common carotid artery demonstrates
some tortuosity without significant stenosis. Bifurcation is
unremarkable. Cervical left ICA is within normal limits.

Vertebral arteries: The vertebral arteries are codominant. Both
vertebral arteries originate from the subclavian arteries without
significant stenosis. No significant stenosis is present in either
vertebral artery in the neck.

Skeleton: Vertebral body heights are normal. No significant
listhesis is present. Straightening of the cervical lordosis noted.
Fusion present at C5-6. Foraminal narrowing is worse right than left
at C4-5, C5-6, and C6-7.

Other neck: Soft tissues of the neck are otherwise unremarkable.

Upper chest: The lung apices are clear. Thoracic inlet is within
normal limits.

Review of the MIP images confirms the above findings

CTA HEAD FINDINGS

Anterior circulation: The internal carotid arteries are within
normal limits from the skull base through the ICA termini. Mild
narrowing is present in proximal left A1 segment. M1 segments are
within normal limits bilaterally. Few segmental narrowing present
throughout the ACA and MCA branch vessels bilaterally without a
significant proximal occlusion focal stenosis.

Posterior circulation: The vertebral arteries are codominant. PICA
origins are visualized and normal. Vertebrobasilar junction is
normal. Basilar artery is within normal limits. Both posterior
cerebral arteries originate from the basilar tip. The PCA branch
vessels demonstrate some distal attenuation a significant proximal
stenosis or occlusion.

Venous sinuses: Dural sinuses are patent. Straight sinus deep
cerebral veins are intact. Cortical veins are unremarkable.

Anatomic variants: None

Review of the MIP images confirms the above findings
IMPRESSION: 1. No emergent large vessel occlusion.
2. Mild narrowing of the proximal left A1 segment.
3. Diffuse segmental narrowing of the ACA, MCA, and PCA branch
vessels consistent with intracranial atherosclerotic disease. No
significant branch vessel occlusion or aneurysm.
4. Mild atherosclerotic changes at the right carotid bifurcation and
proximal right ICA without significant stenosis.
5. Multilevel spondylosis of the cervical spine with fusion at C5-6.
6. Aortic Atherosclerosis ([ZG]-[ZG]).

## 2020-04-19 MED ORDER — PROCHLORPERAZINE EDISYLATE 10 MG/2ML IJ SOLN
10.0000 mg | Freq: Once | INTRAMUSCULAR | Status: AC
  Administered 2020-04-19: 10 mg via INTRAVENOUS
  Filled 2020-04-19: qty 2

## 2020-04-19 MED ORDER — MECLIZINE HCL 25 MG PO TABS
50.0000 mg | ORAL_TABLET | Freq: Once | ORAL | Status: AC
  Administered 2020-04-19: 50 mg via ORAL
  Filled 2020-04-19: qty 2

## 2020-04-19 MED ORDER — SODIUM CHLORIDE 0.9 % IV BOLUS
250.0000 mL | Freq: Once | INTRAVENOUS | Status: AC
  Administered 2020-04-19: 250 mL via INTRAVENOUS

## 2020-04-19 MED ORDER — MECLIZINE HCL 25 MG PO TABS: 25.0000 mg | ORAL_TABLET | Freq: Three times a day (TID) | ORAL | 0 refills | Status: DC | PRN

## 2020-04-19 MED ORDER — DIAZEPAM 5 MG/ML IJ SOLN
2.5000 mg | Freq: Once | INTRAMUSCULAR | Status: AC
  Administered 2020-04-19: 2.5 mg via INTRAVENOUS
  Filled 2020-04-19: qty 2

## 2020-04-19 MED ORDER — IOHEXOL 350 MG/ML SOLN
100.0000 mL | Freq: Once | INTRAVENOUS | Status: AC | PRN
  Administered 2020-04-19: 100 mL via INTRAVENOUS

## 2020-04-19 MED ORDER — DIAZEPAM 5 MG/ML IJ SOLN
2.5000 mg | Freq: Once | INTRAMUSCULAR | Status: DC
  Filled 2020-04-19: qty 2

## 2020-04-19 NOTE — ED Notes (Signed)
Pt transferred via [facility] for MRI for severe dizziness that started at 7am.  States he felt fine when he woke up this morning.  No arm drift or other neuro deficits noted.  States he is not claustrophobic and doesn't need medicating prior to MRI.

## 2020-04-19 NOTE — ED Triage Notes (Signed)
Pt via ems from home with n/v/dizziness this morning. EMS reports that he has had episodes of the same. EMS reports projectile vomiting and dizziness and diaphoresis that occurred while shaving. Pt received one liter of NS en route. Pt was diagnosed with vertigo upon earlier episode (in 1980's). Pt alert & answering questions appropriately during triage. NAD noted.

## 2020-04-19 NOTE — ED Notes (Signed)
[facility] leaving; attempted to try report call again; sent to voice mail that won't take messages.

## 2020-04-19 NOTE — Discharge Instructions (Addendum)
Call your primary care doctor or specialist as discussed in the next 2-3 days.   Return immediately back to the ER if:  Your symptoms worsen within the next 12-24 hours. You develop new symptoms such as new fevers, persistent vomiting, new pain, shortness of breath, or new weakness or numbness, or if you have any other concerns.  

## 2020-04-19 NOTE — ED Provider Notes (Signed)
[facility] EMERGENCY DEPARTMENT Provider Note   CSN: [id number] Arrival date & time: 04/19/20  4098     History Chief Complaint  Patient presents with  . Dizziness    Alexander Pierce is a 70 y.o. male.  Patient presents with episode of dizziness that has been ongoing today.  Symptoms started around 7 AM when he woke up.  Last known well was last night.  He states that when he tries to walk he feels too unsteady to walk.  Denies any numbness or weakness anywhere else.  Denies headache chest pain abdominal pain.  No fever no vomiting cough or diarrhea.  He states he had dizziness in the past several years ago but has not had symptoms until last night again.  He was seen in outside ER had work-up done including CT imaging and CT angios that were unremarkable.  Sent here for further MRI imaging.        Past Medical History:  Diagnosis Date  . ALLERGIC RHINITIS 06/29/2007   Qualifier: Diagnosis of  By: Jonny Ruiz MD, Len Blalock   . Allergy    seasonal  . HYPERLIPIDEMIA 06/29/2007   Qualifier: Diagnosis of  By: Jonny Ruiz MD, Len Blalock   . Right inguinal hernia 06/24/2011    Patient Active Problem List   Diagnosis Date Noted  . Blood pressure elevated without history of HTN 02/27/2019  . Vitamin D deficiency 02/27/2019  . Low back pain 02/27/2019  . Finger infection 07/20/2018  . Right lateral epicondylitis 01/23/2018  . Hyponatremia 05/04/2016  . Tick bite of left upper arm 05/03/2016  . Fever 05/03/2016  . Upper respiratory tract infection 05/03/2016  . 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 Increased prostate specific antigen (PSA) velocity 12/25/2014  . Perianal mass 12/25/2014  . 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 Primary localized osteoarthrosis, lower leg 02/15/2013  . Screening for other and unspecified cardiovascular conditions 02/07/2013  . 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 Right knee pain 02/07/2013  . Bilateral inguinal  hernia, right larger than left. 07/09/2011  . Skin lesion 06/24/2011  . Encounter for well adult exam with abnormal findings 06/19/2011  . Hyperlipidemia 06/29/2007  . Allergic rhinitis 06/29/2007    Past Surgical History:  Procedure Laterality Date  . INGUINAL HERNIA REPAIR    . sinus surgury  1992   dr Haroldine Laws       Family History  Problem Relation Age of Onset  . Parkinsonism Other   . Prostate cancer Other   . Hypertension Other   . Colon cancer Neg Hx   . Rectal cancer Neg Hx   . Stomach cancer Neg Hx     Social History   Tobacco Use  . Smoking status: Never Smoker  . Smokeless tobacco: Never Used  Substance Use Topics  . Alcohol use: No  . Drug use: No    Home Medications Prior to Admission medications   Medication Sig Start Date End Date Taking? Authorizing Provider  meclizine (ANTIVERT) 25 MG tablet Take 1 tablet (25 mg total) by mouth 3 (three) times daily as needed for up to 15 doses for dizziness. 04/19/20  Yes Cheryll Cockayne, MD    Allergies    Acetaminophen, Aspirin, Ibuprofen, and Penicillins  Review of Systems   Review of Systems  Constitutional: Negative for fever.  HENT: Negative for ear pain and sore throat.   Eyes: Negative for pain.  Respiratory: Negative for cough.   Cardiovascular: Negative for  chest pain.  Gastrointestinal: Negative for abdominal pain.  Genitourinary: Negative for flank pain.  Musculoskeletal: Negative for back pain.  Skin: Negative for color change and rash.  Neurological: Negative for syncope.  All other systems reviewed and are negative.   Physical Exam Updated Vital Signs BP (!) 199/102 (BP Location: Left Arm)   Pulse 83   Temp 97.6 F (36.4 C) (Oral)   Resp 16   Ht 6\' 2"  (1.88 m)   Wt 98.9 kg   SpO2 96%   BMI 27.99 kg/m   Physical Exam Constitutional:      General: He is not in acute distress.    Appearance: He is well-developed.  HENT:     Head: Normocephalic.     Nose: Nose normal.  Eyes:      Extraocular Movements: Extraocular movements intact.  Cardiovascular:     Rate and Rhythm: Normal rate.  Pulmonary:     Effort: Pulmonary effort is normal.  Skin:    Coloration: Skin is not jaundiced.  Neurological:     General: No focal deficit present.     Mental Status: He is alert and oriented to person, place, and time. Mental status is at baseline.     Cranial Nerves: No cranial nerve deficit.     Motor: No weakness.     ED Results / Procedures / Treatments   Labs (all labs ordered are listed, but only abnormal results are displayed) Labs Reviewed  CBC WITH DIFFERENTIAL/PLATELET - Abnormal; Notable for the following components:      Result Value   WBC 12.1 (*)    Neutro Abs 9.7 (*)    All other components within normal limits  BASIC METABOLIC PANEL - Abnormal; Notable for the following components:   Sodium 134 (*)    Glucose, Bld 136 (*)    Calcium 8.2 (*)    All other components within normal limits    EKG None  Radiology CT Angio Head W or Wo Contrast  Result Date: 04/19/2020 CLINICAL DATA:  Dizziness.  Nausea and vomiting. EXAM: CT ANGIOGRAPHY HEAD AND NECK TECHNIQUE: Multidetector CT imaging of the head and neck was performed using the standard protocol during bolus administration of intravenous contrast. Multiplanar CT image reconstructions and MIPs were obtained to evaluate the vascular anatomy. Carotid stenosis measurements (when applicable) are obtained utilizing NASCET criteria, using the distal internal carotid diameter as the denominator. CONTRAST:  06/19/2020 OMNIPAQUE IOHEXOL 350 MG/ML SOLN COMPARISON:  CT head without contrast 04/19/2020 FINDINGS: CTA NECK FINDINGS Aortic arch: Atherosclerotic calcifications scratched at 3 vessel arch configuration is present. No significant stenosis or aneurysm is present. Mild atherosclerotic calcifications are present in the arch beyond the great vessel origins. Right carotid system: Right common carotid artery is within normal  limits. Atherosclerotic changes are present at the bifurcation and proximal right ICA without significant stenosis. Cervical right ICA is normal. Left carotid system: The left common carotid artery demonstrates some tortuosity without significant stenosis. Bifurcation is unremarkable. Cervical left ICA is within normal limits. Vertebral arteries: The vertebral arteries are codominant. Both vertebral arteries originate from the subclavian arteries without significant stenosis. No significant stenosis is present in either vertebral artery in the neck. Skeleton: Vertebral body heights are normal. No significant listhesis is present. Straightening of the cervical lordosis noted. Fusion present at C5-6. Foraminal narrowing is worse right than left at C4-5, C5-6, and C6-7. Other neck: Soft tissues of the neck are otherwise unremarkable. Upper chest: The lung apices are clear. Thoracic inlet  is within normal limits. Review of the MIP images confirms the above findings CTA HEAD FINDINGS Anterior circulation: The internal carotid arteries are within normal limits from the skull base through the ICA termini. Mild narrowing is present in proximal left A1 segment. M1 segments are within normal limits bilaterally. Few segmental narrowing present throughout the ACA and MCA branch vessels bilaterally without a significant proximal occlusion focal stenosis. Posterior circulation: The vertebral arteries are codominant. PICA origins are visualized and normal. Vertebrobasilar junction is normal. Basilar artery is within normal limits. Both posterior cerebral arteries originate from the basilar tip. The PCA branch vessels demonstrate some distal attenuation a significant proximal stenosis or occlusion. Venous sinuses: Dural sinuses are patent. Straight sinus deep cerebral veins are intact. Cortical veins are unremarkable. Anatomic variants: None Review of the MIP images confirms the above findings IMPRESSION: 1. No emergent large  vessel occlusion. 2. Mild narrowing of the proximal left A1 segment. 3. Diffuse segmental narrowing of the ACA, MCA, and PCA branch vessels consistent with intracranial atherosclerotic disease. No significant branch vessel occlusion or aneurysm. 4. Mild atherosclerotic changes at the right carotid bifurcation and proximal right ICA without significant stenosis. 5. 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 6. Aortic Atherosclerosis (ICD10-I70.0). Electronically Signed   By: Marin Roberts M.D.   On: 04/19/2020 11:25   CT Head Wo Contrast  Result Date: 04/19/2020 CLINICAL DATA:  Dizziness Nausea Vomiting EXAM: CT HEAD WITHOUT CONTRAST TECHNIQUE: Contiguous axial images were obtained from the base of the skull through the vertex without intravenous contrast. COMPARISON:  None. FINDINGS: Brain: No evidence of acute infarction, hemorrhage, hydrocephalus, extra-axial collection or mass lesion/mass effect. Vascular: No hyperdense vessel or unexpected calcification. Skull: Normal. Negative for fracture or focal lesion. Sinuses/Orbits: Postsurgical changes of the maxillary sinuses are seen. There is partial opacification of the ethmoid air cells and maxillary sinuses. There is periapical lucency surrounding the first right maxillary molar. Other: None. IMPRESSION: No acute intracranial abnormality. Electronically Signed   By: Acquanetta Belling M.D.   On: 04/19/2020 10:19   CT Angio Neck W and/or Wo Contrast  Result Date: 04/19/2020 CLINICAL DATA:  Dizziness.  Nausea and vomiting. EXAM: CT ANGIOGRAPHY HEAD AND NECK TECHNIQUE: Multidetector CT imaging of the head and neck was performed using the standard protocol during bolus administration of intravenous contrast. Multiplanar CT image reconstructions and MIPs were obtained to evaluate the vascular anatomy. Carotid stenosis measurements (when applicable) are obtained utilizing NASCET criteria, using the distal internal carotid diameter as the  denominator. CONTRAST:  OMNIPAQUE IOHEXOL 350 MG/ML SOLN COMPARISON:  CT head without contrast 04/19/2020 FINDINGS: CTA NECK FINDINGS Aortic arch: Atherosclerotic calcifications scratched at 3 vessel arch configuration is present. No significant stenosis or aneurysm is present. Mild atherosclerotic calcifications are present in the arch beyond the great vessel origins. Right carotid system: Right common carotid artery is within normal limits. Atherosclerotic changes are present at the bifurcation and proximal right ICA without significant stenosis. Cervical right ICA is normal. Left carotid system: The left common carotid artery demonstrates some tortuosity without significant stenosis. Bifurcation is unremarkable. Cervical left ICA is within normal limits. Vertebral arteries: The vertebral arteries are codominant. Both vertebral arteries originate from the subclavian arteries without significant stenosis. No significant stenosis is present in either vertebral artery in the neck. Skeleton: Vertebral body heights are normal. No significant listhesis is present. Straightening of the cervical lordosis noted. Fusion present at C5-6. Foraminal narrowing is worse right than left at C4-5,  C5-6, and C6-7. Other neck: Soft tissues of the neck are otherwise unremarkable. Upper chest: The lung apices are clear. Thoracic inlet is within normal limits. Review of the MIP images confirms the above findings CTA HEAD FINDINGS Anterior circulation: The internal carotid arteries are within normal limits from the skull base through the ICA termini. Mild narrowing is present in proximal left A1 segment. M1 segments are within normal limits bilaterally. Few segmental narrowing present throughout the ACA and MCA branch vessels bilaterally without a significant proximal occlusion focal stenosis. Posterior circulation: The vertebral arteries are codominant. PICA origins are visualized and normal. Vertebrobasilar junction is normal.  Basilar artery is within normal limits. Both posterior cerebral arteries originate from the basilar tip. The PCA branch vessels demonstrate some distal attenuation a significant proximal stenosis or occlusion. Venous sinuses: Dural sinuses are patent. Straight sinus deep cerebral veins are intact. Cortical veins are unremarkable. Anatomic variants: None Review of the MIP images confirms the above findings IMPRESSION: 1. No emergent large vessel occlusion. 2. Mild narrowing of the proximal left A1 segment. 3. Diffuse segmental narrowing of the ACA, MCA, and PCA branch vessels consistent with intracranial atherosclerotic disease. No significant branch vessel occlusion or aneurysm. 4. Mild atherosclerotic changes at the right carotid bifurcation and proximal right ICA without significant stenosis. 5. 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 6. Aortic Atherosclerosis (ICD10-I70.0). Electronically Signed   By: Marin Roberts M.D.   On: 04/19/2020 11:25   MR BRAIN WO CONTRAST  Result Date: 04/19/2020 CLINICAL DATA:  Dizziness.  Nausea. EXAM: MRI HEAD WITHOUT CONTRAST TECHNIQUE: Multiplanar, multiecho pulse sequences of the brain and surrounding structures were obtained without intravenous contrast. COMPARISON:  CT head without contrast 04/19/2020. CTA head neck 04/19/2020. FINDINGS: Brain: No acute infarct, hemorrhage, or mass lesion is present. Minimal periventricular white matter changes are likely within normal limits for age. The ventricles are of normal size. Basal ganglia are within normal limits. No significant extraaxial fluid collection is present. The internal auditory canals are within normal limits. The brainstem and cerebellum are within normal limits. Vascular: Flow is present in the major intracranial arteries. Skull and upper cervical spine: The craniocervical junction is normal. Upper cervical spine is within normal limits. Marrow signal is unremarkable. Sinuses/Orbits:  Mild mucosal thickening is present within anterior ethmoid air cells bilaterally. Frontal sinuses are clear. The paranasal sinuses and mastoid air cells are otherwise clear. The globes and orbits are within normal limits. IMPRESSION: 1. Normal MRI appearance of the brain for age. 2. Mild anterior ethmoid sinus disease. Electronically Signed   By: Marin Roberts M.D.   On: 04/19/2020 15:44    Procedures Procedures   Medications Ordered in ED Medications  diazepam (VALIUM) injection 2.5 mg (2.5 mg Intravenous Not Given 04/19/20 1253)  diazepam (VALIUM) injection 2.5 mg (2.5 mg Intravenous Given 04/19/20 0936)  sodium chloride 0.9 % bolus 250 mL (0 mLs Intravenous Stopped 04/19/20 0952)  meclizine (ANTIVERT) tablet 50 mg (50 mg Oral Given 04/19/20 1018)  prochlorperazine (COMPAZINE) injection 10 mg (10 mg Intravenous Given 04/19/20 1016)  iohexol (OMNIPAQUE) 350 MG/ML injection 100 mL (100 mLs Intravenous Contrast Given 04/19/20 1052)    ED Course  I have reviewed the triage vital signs and the nursing notes.  Pertinent labs & imaging results that were available during my care of the patient were reviewed by me and considered in my medical decision making (see chart for details).    MDM Rules/Calculators/A&P  CT head CT angio head and neck shows no emergent large vessel occlusions per radiology.  Multiple small vessel disease noted.  MRI of the brain shows no acute infarct.  Patient still has some dizziness, however given work-up today feel this is more vertigo in nature and less likely ischemic emboli related.  We will recommend outpatient follow-up with his doctor within the week.  Recommend immediate return for worsening symptoms fevers pain trouble breathing or any additional concerns.  Final Clinical Impression(s) / ED Diagnoses Final diagnoses:  Dizziness    Rx / DC Orders ED Discharge Orders         Ordered    meclizine (ANTIVERT) 25 MG tablet  3 times  daily PRN        04/19/20 1749           Cheryll CockayneHong, Darwin Guastella S, MD 04/19/20 1749

## 2020-04-19 NOTE — ED Notes (Signed)
Attempted to call report to 2 different numbers at [facility], both times I was transferred to a voice mail that is not accepting messages. Will attempt to call again when [facility] leaves; otherwise [facility] RN has been given report.

## 2020-04-19 NOTE — ED Provider Notes (Signed)
[facility]   CSN: [id number] Arrival date & time: 04/19/20  [id number]     History Chief Complaint  Patient presents with  . Dizziness    Alexander Pierce is a 70 y.o. male.  The history is provided by the patient.  Dizziness Quality:  Head spinning, room spinning and vertigo Severity:  Severe Onset quality:  Sudden Duration:  1 hour Timing:  Constant Progression:  Unchanged Chronicity:  New Context: head movement (to left)   Relieved by:  Being still Worsened by:  Turning head Associated symptoms: nausea   Associated symptoms: no blood in stool, no chest pain, no diarrhea, no headaches, no hearing loss, no palpitations, no shortness of breath, no syncope, no tinnitus, no vision changes, no vomiting and no weakness   Risk factors: hx of vertigo (possibly)        Past Medical History:  Diagnosis Date  . ALLERGIC RHINITIS 06/29/2007   Qualifier: Diagnosis of  By: Jonny RuizJohn MD, Len BlalockJames W   . Allergy    seasonal  . HYPERLIPIDEMIA 06/29/2007   Qualifier: Diagnosis of  By: Jonny RuizJohn MD, Len BlalockJames W   . Right inguinal hernia 06/24/2011    Patient Active Problem List   Diagnosis Date Noted  . Blood pressure elevated without history of HTN 02/27/2019  . Vitamin D deficiency 02/27/2019  . Low back pain 02/27/2019  . Finger infection 07/20/2018  . Right lateral epicondylitis 01/23/2018  . Hyponatremia 05/04/2016  . Tick bite of left upper arm 05/03/2016  . Fever 05/03/2016  . Upper respiratory tract infection 05/03/2016  . 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 Increased prostate specific antigen (PSA) velocity 12/25/2014  . Perianal mass 12/25/2014  . 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 Primary localized osteoarthrosis, lower leg 02/15/2013  . Screening for other and unspecified cardiovascular conditions 02/07/2013  . 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 Right knee pain 02/07/2013  . Bilateral inguinal hernia,  right larger than left. 07/09/2011  . Skin lesion 06/24/2011  . Encounter for well adult exam with abnormal findings 06/19/2011  . Hyperlipidemia 06/29/2007  . Allergic rhinitis 06/29/2007    Past Surgical History:  Procedure Laterality Date  . INGUINAL HERNIA REPAIR    . sinus surgury  1992   dr Haroldine Lawscrossley       Family History  Problem Relation Age of Onset  . Parkinsonism Other   . Prostate cancer Other   . Hypertension Other   . Colon cancer Neg Hx   . Rectal cancer Neg Hx   . Stomach cancer Neg Hx     Social History   Tobacco Use  . Smoking status: Never Smoker  . Smokeless tobacco: Never Used  Substance Use Topics  . Alcohol use: No  . Drug use: No    Home Medications Prior to Admission medications   Not on File    Allergies    Acetaminophen, Aspirin, Ibuprofen, and Penicillins  Review of Systems   Review of Systems  Constitutional: Negative for chills and fever.  HENT: Negative for ear pain, hearing loss, sore throat and tinnitus.   Eyes: Negative for photophobia, pain, redness and visual disturbance.  Respiratory: Negative for cough and shortness of breath.   Cardiovascular: Negative for chest pain, palpitations and syncope.  Gastrointestinal: Positive for nausea. Negative for abdominal distention, abdominal pain, anal bleeding, blood in stool, constipation, diarrhea, rectal pain and vomiting.  Genitourinary: Negative for dysuria and hematuria.  Musculoskeletal: Negative for arthralgias and  back pain.  Skin: Negative for color change and rash.  Neurological: Positive for dizziness. Negative for tremors, seizures, syncope, facial asymmetry, speech difficulty, weakness, light-headedness, numbness and headaches.  All other systems reviewed and are negative.   Physical Exam Updated Vital Signs  ED Triage Vitals  Enc Vitals Group     BP 04/19/20 0915 (!) 150/89     Pulse Rate 04/19/20 0915 74     Resp 04/19/20 0915 13     Temp --      Temp src --       SpO2 04/19/20 0915 97 %     Weight 04/19/20 0916 218 lb (98.9 kg)     Height 04/19/20 0916  (1.88 m)     Head Circumference --      Peak Flow --      Pain Score 04/19/20 0916 0     Pain Loc --      Pain Edu? --      Excl. in GC? --     Physical Exam Vitals and nursing note reviewed.  Constitutional:      General: He is not in acute distress.    Appearance: He is well-developed. He is not ill-appearing.  HENT:     Head: Normocephalic and atraumatic.     Nose: Nose normal.     Mouth/Throat:     Mouth: Mucous membranes are dry.  Eyes:     Extraocular Movements: Extraocular movements intact.     Conjunctiva/sclera: Conjunctivae normal.     Pupils: Pupils are equal, round, and reactive to light.  Cardiovascular:     Rate and Rhythm: Normal rate and regular rhythm.     Heart sounds: No murmur heard.   Pulmonary:     Effort: Pulmonary effort is normal. No respiratory distress.     Breath sounds: Normal breath sounds.  Abdominal:     Palpations: Abdomen is soft.     Tenderness: There is no abdominal tenderness.  Musculoskeletal:        General: Normal range of motion.     Cervical back: Normal range of motion and neck supple. No rigidity.  Skin:    General: Skin is warm and dry.  Neurological:     General: No focal deficit present.     Mental Status: He is alert and oriented to person, place, and time.     Cranial Nerves: No cranial nerve deficit.     Sensory: No sensory deficit.     Motor: No weakness.     Coordination: Coordination normal.     Comments: 5+ out of 5 strength throughout, normal sensation, no drift, normal finger-to-nose finger, normal speech, no visual field deficit, horizontal nystagmus when looking to the left     ED Results / Procedures / Treatments   Labs (all labs ordered are listed, but only abnormal results are displayed) Labs Reviewed  CBC WITH DIFFERENTIAL/PLATELET - Abnormal; Notable for the following components:      Result Value    WBC 12.1 (*)    Neutro Abs 9.7 (*)    All other components within normal limits  BASIC METABOLIC PANEL - Abnormal; Notable for the following components:   Sodium 134 (*)    Glucose, Bld 136 (*)    Calcium 8.2 (*)    All other components within normal limits    EKG None  Radiology CT Angio Head W or Wo Contrast  Result Date: 04/19/2020 CLINICAL DATA:  Dizziness.  Nausea and vomiting. EXAM:  CT ANGIOGRAPHY HEAD AND NECK TECHNIQUE: Multidetector CT imaging of the head and neck was performed using the standard protocol during bolus administration of intravenous contrast. Multiplanar CT image reconstructions and MIPs were obtained to evaluate the vascular anatomy. Carotid stenosis measurements (when applicable) are obtained utilizing NASCET criteria, using the distal internal carotid diameter as the denominator. CONTRAST:  OMNIPAQUE IOHEXOL 350 MG/ML SOLN COMPARISON:  CT head without contrast 04/19/2020 FINDINGS: CTA NECK FINDINGS Aortic arch: Atherosclerotic calcifications scratched at 3 vessel arch configuration is present. No significant stenosis or aneurysm is present. Mild atherosclerotic calcifications are present in the arch beyond the great vessel origins. Right carotid system: Right common carotid artery is within normal limits. Atherosclerotic changes are present at the bifurcation and proximal right ICA without significant stenosis. Cervical right ICA is normal. Left carotid system: The left common carotid artery demonstrates some tortuosity without significant stenosis. Bifurcation is unremarkable. Cervical left ICA is within normal limits. Vertebral arteries: The vertebral arteries are codominant. Both vertebral arteries originate from the subclavian arteries without significant stenosis. No significant stenosis is present in either vertebral artery in the neck. Skeleton: Vertebral body heights are normal. No significant listhesis is present. Straightening of the cervical lordosis  noted. Fusion present at C5-6. Foraminal narrowing is worse right than left at C4-5, C5-6, and C6-7. Other neck: Soft tissues of the neck are otherwise unremarkable. Upper chest: The lung apices are clear. Thoracic inlet is within normal limits. Review of the MIP images confirms the above findings CTA HEAD FINDINGS Anterior circulation: The internal carotid arteries are within normal limits from the skull base through the ICA termini. Mild narrowing is present in proximal left A1 segment. M1 segments are within normal limits bilaterally. Few segmental narrowing present throughout the ACA and MCA branch vessels bilaterally without a significant proximal occlusion focal stenosis. Posterior circulation: The vertebral arteries are codominant. PICA origins are visualized and normal. Vertebrobasilar junction is normal. Basilar artery is within normal limits. Both posterior cerebral arteries originate from the basilar tip. The PCA branch vessels demonstrate some distal attenuation a significant proximal stenosis or occlusion. Venous sinuses: Dural sinuses are patent. Straight sinus deep cerebral veins are intact. Cortical veins are unremarkable. Anatomic variants: None Review of the MIP images confirms the above findings IMPRESSION: 1. No emergent large vessel occlusion. 2. Mild narrowing of the proximal left A1 segment. 3. Diffuse segmental narrowing of the ACA, MCA, and PCA branch vessels consistent with intracranial atherosclerotic disease. No significant branch vessel occlusion or aneurysm. 4. Mild atherosclerotic changes at the right carotid bifurcation and proximal right ICA without significant stenosis. 5. 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 6. Aortic Atherosclerosis (ICD10-I70.0). Electronically Signed   By: Marin Roberts M.D.   On: 04/19/2020 11:25   CT Head Wo Contrast  Result Date: 04/19/2020 CLINICAL DATA:  Dizziness Nausea Vomiting EXAM: CT HEAD WITHOUT CONTRAST  TECHNIQUE: Contiguous axial images were obtained from the base of the skull through the vertex without intravenous contrast. COMPARISON:  None. FINDINGS: Brain: No evidence of acute infarction, hemorrhage, hydrocephalus, extra-axial collection or mass lesion/mass effect. Vascular: No hyperdense vessel or unexpected calcification. Skull: Normal. Negative for fracture or focal lesion. Sinuses/Orbits: Postsurgical changes of the maxillary sinuses are seen. There is partial opacification of the ethmoid air cells and maxillary sinuses. There is periapical lucency surrounding the first right maxillary molar. Other: None. IMPRESSION: No acute intracranial abnormality. Electronically Signed   By: Acquanetta Belling M.D.   On: 04/19/2020 10:19  CT Angio Neck W and/or Wo Contrast  Result Date: 04/19/2020 CLINICAL DATA:  Dizziness.  Nausea and vomiting. EXAM: CT ANGIOGRAPHY HEAD AND NECK TECHNIQUE: Multidetector CT imaging of the head and neck was performed using the standard protocol during bolus administration of intravenous contrast. Multiplanar CT image reconstructions and MIPs were obtained to evaluate the vascular anatomy. Carotid stenosis measurements (when applicable) are obtained utilizing NASCET criteria, using the distal internal carotid diameter as the denominator. CONTRAST:  OMNIPAQUE IOHEXOL 350 MG/ML SOLN COMPARISON:  CT head without contrast 04/19/2020 FINDINGS: CTA NECK FINDINGS Aortic arch: Atherosclerotic calcifications scratched at 3 vessel arch configuration is present. No significant stenosis or aneurysm is present. Mild atherosclerotic calcifications are present in the arch beyond the great vessel origins. Right carotid system: Right common carotid artery is within normal limits. Atherosclerotic changes are present at the bifurcation and proximal right ICA without significant stenosis. Cervical right ICA is normal. Left carotid system: The left common carotid artery demonstrates some tortuosity  without significant stenosis. Bifurcation is unremarkable. Cervical left ICA is within normal limits. Vertebral arteries: The vertebral arteries are codominant. Both vertebral arteries originate from the subclavian arteries without significant stenosis. No significant stenosis is present in either vertebral artery in the neck. Skeleton: Vertebral body heights are normal. No significant listhesis is present. Straightening of the cervical lordosis noted. Fusion present at C5-6. Foraminal narrowing is worse right than left at C4-5, C5-6, and C6-7. Other neck: Soft tissues of the neck are otherwise unremarkable. Upper chest: The lung apices are clear. Thoracic inlet is within normal limits. Review of the MIP images confirms the above findings CTA HEAD FINDINGS Anterior circulation: The internal carotid arteries are within normal limits from the skull base through the ICA termini. Mild narrowing is present in proximal left A1 segment. M1 segments are within normal limits bilaterally. Few segmental narrowing present throughout the ACA and MCA branch vessels bilaterally without a significant proximal occlusion focal stenosis. Posterior circulation: The vertebral arteries are codominant. PICA origins are visualized and normal. Vertebrobasilar junction is normal. Basilar artery is within normal limits. Both posterior cerebral arteries originate from the basilar tip. The PCA branch vessels demonstrate some distal attenuation a significant proximal stenosis or occlusion. Venous sinuses: Dural sinuses are patent. Straight sinus deep cerebral veins are intact. Cortical veins are unremarkable. Anatomic variants: None Review of the MIP images confirms the above findings IMPRESSION: 1. No emergent large vessel occlusion. 2. Mild narrowing of the proximal left A1 segment. 3. Diffuse segmental narrowing of the ACA, MCA, and PCA branch vessels consistent with intracranial atherosclerotic disease. No significant branch vessel occlusion  or aneurysm. 4. Mild atherosclerotic changes at the right carotid bifurcation and proximal right ICA without significant stenosis. 5. 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 6. Aortic Atherosclerosis (ICD10-I70.0). Electronically Signed   By: Marin Roberts M.D.   On: 04/19/2020 11:25    Procedures Procedures   Medications Ordered in ED Medications  diazepam (VALIUM) injection 2.5 mg (has no administration in time range)  diazepam (VALIUM) injection 2.5 mg (2.5 mg Intravenous Given 04/19/20 0936)  sodium chloride 0.9 % bolus 250 mL (0 mLs Intravenous Stopped 04/19/20 0952)  meclizine (ANTIVERT) tablet 50 mg (50 mg Oral Given 04/19/20 1018)  prochlorperazine (COMPAZINE) injection 10 mg (10 mg Intravenous Given 04/19/20 1016)  iohexol (OMNIPAQUE) 350 MG/ML injection 100 mL (100 mLs Intravenous Contrast Given 04/19/20 1052)    ED Course  I have reviewed the triage vital signs and the  nursing notes.  Pertinent labs & imaging results that were available during my care of the patient were reviewed by me and considered in my medical decision making (see chart for details).    MDM Rules/Calculators/A&P                          Alexander Pierce is a 70 year old male with no significant medical history presents the ED with dizziness and nausea.  Overall unremarkable vitals.  States he has a history of vertigo in the past a long time ago.  Symptoms came on suddenly about an hour and a half ago.  Got dizzy and nauseous.  Feels like the room is spinning.  Denies any weakness or numbness.  Neurologically he appears intact.  He has horizontal nystagmus on looking to the left that makes symptoms worse.  When he is still symptoms feel better.  He got a liter of fluids with EMS.  He denies any chest pain or shortness of breath.  Suspect that he has a peripheral vertigo.  Lower concern for central process at this time such as stroke.  However he still fairly symptomatic.  We will give him a  dose of Valium and continue some IV fluids and check basic labs and a head CT to rule out head bleed.  11:51 AM lab work showed no significant anemia, electrolyte abnormality, kidney injury.  CTA of head and neck showed no large vessel occlusion.  No hemorrhage.  Does show some mild atherosclerotic disease.  Still feeling fairly symptomatic after Valium, Compazine, meclizine.  Talked with Dr. Jacqulyn Bath over at [facility] emergency department for transfer for MRI to further rule out stroke.  Will give an additional dose of IV Valium.  If MRI is normal and he is feeling improved suspect that he could be treated for peripheral vertigo.  May need further admission just for symptomatic control even if MRI is negative for stroke.  This chart was dictated using voice recognition software.  Despite best efforts to proofread,  errors can occur which can change the documentation meaning.    Final Clinical Impression(s) / ED Diagnoses Final diagnoses:  Dizziness    Rx / DC Orders ED Discharge Orders    None       Virgina Norfolk, DO 04/19/20 1151

## 2020-04-19 NOTE — ED Notes (Signed)
Pt d/c home per MD order. Discharge summary reviewed with pt, pt verbalizes understanding. No s/s of acute distress noted at discharge. Reports discharge ride home.  

## 2020-04-19 NOTE — ED Notes (Signed)
Refused dose wasted in stericycle; unable to waste in pyxis

## 2020-04-21 ENCOUNTER — Other Ambulatory Visit: Payer: Self-pay

## 2020-04-24 ENCOUNTER — Ambulatory Visit: Payer: BC Managed Care – PPO | Admitting: Internal Medicine

## 2020-04-24 ENCOUNTER — Encounter: Payer: Self-pay | Admitting: Internal Medicine

## 2020-04-24 ENCOUNTER — Other Ambulatory Visit: Payer: Self-pay

## 2020-04-24 ENCOUNTER — Telehealth: Payer: Self-pay

## 2020-04-24 VITALS — BP 148/90 | HR 72 | Ht 74.0 in | Wt 214.0 lb

## 2020-04-24 DIAGNOSIS — R030 Elevated blood-pressure reading, without diagnosis of hypertension: Secondary | ICD-10-CM | POA: Diagnosis not present

## 2020-04-24 DIAGNOSIS — R739 Hyperglycemia, unspecified: Secondary | ICD-10-CM | POA: Insufficient documentation

## 2020-04-24 DIAGNOSIS — E7800 Pure hypercholesterolemia, unspecified: Secondary | ICD-10-CM

## 2020-04-24 DIAGNOSIS — E559 Vitamin D deficiency, unspecified: Secondary | ICD-10-CM | POA: Diagnosis not present

## 2020-04-24 DIAGNOSIS — I951 Orthostatic hypotension: Secondary | ICD-10-CM

## 2020-04-24 DIAGNOSIS — E538 Deficiency of other specified B group vitamins: Secondary | ICD-10-CM | POA: Diagnosis not present

## 2020-04-24 DIAGNOSIS — Z0000 Encounter for general adult medical examination without abnormal findings: Secondary | ICD-10-CM | POA: Diagnosis not present

## 2020-04-24 DIAGNOSIS — R972 Elevated prostate specific antigen [PSA]: Secondary | ICD-10-CM

## 2020-04-24 DIAGNOSIS — Z0001 Encounter for general adult medical examination with abnormal findings: Secondary | ICD-10-CM

## 2020-04-24 LAB — URINALYSIS, ROUTINE W REFLEX MICROSCOPIC
Bilirubin Urine: NEGATIVE
Hgb urine dipstick: NEGATIVE
Ketones, ur: NEGATIVE
Leukocytes,Ua: NEGATIVE
Nitrite: NEGATIVE
RBC / HPF: NONE SEEN (ref 0–?)
Specific Gravity, Urine: 1.01 (ref 1.000–1.030)
Total Protein, Urine: NEGATIVE
Urine Glucose: NEGATIVE
Urobilinogen, UA: 0.2 (ref 0.0–1.0)
WBC, UA: NONE SEEN (ref 0–?)
pH: 6.5 (ref 5.0–8.0)

## 2020-04-24 LAB — VITAMIN B12: Vitamin B-12: 387 pg/mL (ref 211–911)

## 2020-04-24 LAB — BASIC METABOLIC PANEL
BUN: 16 mg/dL (ref 6–23)
CO2: 32 mEq/L (ref 19–32)
Calcium: 9.1 mg/dL (ref 8.4–10.5)
Chloride: 97 mEq/L (ref 96–112)
Creatinine, Ser: 0.94 mg/dL (ref 0.40–1.50)
GFR: 81.52 mL/min (ref >60.00)
Glucose, Bld: 77 mg/dL (ref 70–99)
Potassium: 4.4 mEq/L (ref 3.5–5.1)
Sodium: 132 mEq/L — ABNORMAL LOW (ref 135–145)

## 2020-04-24 LAB — CBC WITH DIFFERENTIAL/PLATELET
Basophils Absolute: 0.1 10*3/uL (ref 0.0–0.1)
Basophils Relative: 0.7 % (ref 0.0–3.0)
Eosinophils Absolute: 0.3 10*3/uL (ref 0.0–0.7)
Eosinophils Relative: 3.3 % (ref 0.0–5.0)
HCT: 43.1 % (ref 39.0–52.0)
Hemoglobin: 14.7 g/dL (ref 13.0–17.0)
Lymphocytes Relative: 18.8 % (ref 12.0–46.0)
Lymphs Abs: 1.6 10*3/uL (ref 0.7–4.0)
MCHC: 34.2 g/dL (ref 30.0–36.0)
MCV: 88.5 fl (ref 78.0–100.0)
Monocytes Absolute: 1 10*3/uL (ref 0.1–1.0)
Monocytes Relative: 11.9 % (ref 3.0–12.0)
Neutro Abs: 5.7 10*3/uL (ref 1.4–7.7)
Neutrophils Relative %: 65.3 % (ref 43.0–77.0)
Platelets: 303 10*3/uL (ref 150.0–400.0)
RBC: 4.86 Mil/uL (ref 4.22–5.81)
RDW: 14.1 % (ref 11.5–15.5)
WBC: 8.7 10*3/uL (ref 4.0–10.5)

## 2020-04-24 LAB — HEPATIC FUNCTION PANEL
ALT: 23 U/L (ref 0–53)
AST: 19 U/L (ref 0–37)
Albumin: 4.3 g/dL (ref 3.5–5.2)
Alkaline Phosphatase: 78 U/L (ref 39–117)
Bilirubin, Direct: 0.1 mg/dL (ref 0.0–0.3)
Total Bilirubin: 0.5 mg/dL (ref 0.2–1.2)
Total Protein: 7.1 g/dL (ref 6.0–8.3)

## 2020-04-24 LAB — LIPID PANEL
Cholesterol: 188 mg/dL (ref 0–200)
HDL: 48.1 mg/dL (ref >39.00)
LDL Cholesterol: 116 mg/dL — ABNORMAL HIGH (ref 0–99)
NonHDL: 140
Total CHOL/HDL Ratio: 4
Triglycerides: 121 mg/dL (ref 0.0–149.0)
VLDL: 24.2 mg/dL (ref 0.0–40.0)

## 2020-04-24 LAB — VITAMIN D 25 HYDROXY (VIT D DEFICIENCY, FRACTURES): VITD: 28.73 ng/mL — ABNORMAL LOW (ref 30.00–100.00)

## 2020-04-24 LAB — PSA: PSA: 5.16 ng/mL — ABNORMAL HIGH (ref 0.10–4.00)

## 2020-04-24 LAB — TSH: TSH: 1.61 u[IU]/mL (ref 0.35–4.50)

## 2020-04-24 LAB — HEMOGLOBIN A1C: Hgb A1c MFr Bld: 6.1 % (ref 4.6–6.5)

## 2020-04-24 NOTE — Telephone Encounter (Signed)
Received a fax with a denial of the Synv[employer] claiming, " the medical records sent do not confirm the member has used fewer pain medications, had less pain, and had improvement in activities of daily living since the last knee injections."  I sent in last OV note that stated "patient has had viscosupplementation in the previously with intermittent improvement." I am guessing that was not enough.   Not sure if we want to addend the OV note with that information and I can appeal or what we want to do.

## 2020-04-24 NOTE — Patient Instructions (Signed)
Please continue to monitor your blood pressure on a regular basis, with the goal being to be at least less than 140/90  Please continue all other medications as before, and refills have been done if requested.  Please have the pharmacy call with any other refills you may need.  Please continue your efforts at being more active, low cholesterol diet, and weight control.  You are otherwise up to date with prevention measures today.  Please keep your appointments with your specialists as you may have planned  Please go to the LAB at the blood drawing area for the tests to be done  You will be contacted by phone if any changes need to be made immediately.  Otherwise, you will receive a letter about your results with an explanation, but please check with MyChart first.  Please remember to sign up for MyChart if you have not done so, as this will be important to you in the future with finding out test results, communicating by private email, and scheduling acute appointments online when needed.  Please make an Appointment to return in 6 months, or sooner if needed

## 2020-04-24 NOTE — Assessment & Plan Note (Signed)
Lab Results  Component Value Date   HGBA1C 6.1 04/24/2020   Stable, pt to continue current medical treatment  - diet

## 2020-04-24 NOTE — Assessment & Plan Note (Signed)
Recent dizzines improved - ? orthostasis vs vertigo, not better with antivert and BP seems to point more towards orthostasis, feels much improved today at any rate, encouraged more fluids, hold antivert prn,  to f/u any worsening symptoms or concerns

## 2020-04-24 NOTE — Assessment & Plan Note (Signed)
bp elevated consistently, dw pt, declines new BP med today, encouraged to f/u bp at home and next visit, with goal at least < 140'90

## 2020-04-24 NOTE — Assessment & Plan Note (Signed)
For fu psa 

## 2020-04-24 NOTE — Progress Notes (Signed)
Patient ID: Alexander Pierce, male   DOB: [birth year], 70 y.o.   MRN: [id number]         Chief Complaint:: wellness exam and ED f/u dizziness       HPI:  Alexander Pierce is a 70 y.o. male here for wellness exam; declines covid vax and prevnar, o/w up to date with preventive referrals and immunizations                        Also seen apr 1 per sport med with left knee cortisone injection which has helped, then sat apr 2 went to ED after episode dizzy lightheaded assoc with nausea, mrI negative, tx for vertigo with antivert but medication seemed to make him worse.  At any rate gradually improved over the next 3 days, mowed the grass yesterday, and today feels back to baseline.  Pt denies chest pain, increased sob or doe, wheezing, orthopnea, PND, increased LE swelling, palpitations, or syncope.  Pt denies polydipsia, polyuria, Denies other new worsening focal neuro s/s.   Pt denies fever, wt loss, night sweats, loss of appetite, or other constitutional symptoms    No other new complaints   Wt Readings from Last 3 Encounters:  04/24/20 214 lb (97.1 kg)  04/19/20 218 lb (98.9 kg)  04/18/20 218 lb (98.9 kg)   BP Readings from Last 3 Encounters:  04/24/20 (!) 148/90  04/19/20 (!) 174/95  04/18/20 (!) 148/98   Immunization History  Administered Date(s) Administered  . DTaP 01/19/2008  . Tdap 07/20/2018   There are no preventive care reminders to display for this patient.  Past Medical History:  Diagnosis Date  . ALLERGIC RHINITIS 06/29/2007   Qualifier: Diagnosis of  By: Jonny Ruiz MD, Len Blalock   . Allergy    seasonal  . HYPERLIPIDEMIA 06/29/2007   Qualifier: Diagnosis of  By: Jonny Ruiz MD, Len Blalock   . Right inguinal hernia 06/24/2011   Past Surgical History:  Procedure Laterality Date  . INGUINAL HERNIA REPAIR    . sinus surgury  1992   dr Haroldine Laws    reports that he has never smoked. He has never used smokeless tobacco. He reports that he does not drink alcohol and does not use drugs. family history  includes Hypertension in an other family member; Parkinsonism in an other family member; Prostate cancer in an other family member. Allergies  Allergen Reactions  . Acetaminophen     REACTION: eyes swell  . Aspirin     REACTION: eyes swell  . Ibuprofen     REACTION: eyes swell  . Penicillins     REACTION: pt cant remember   Current Outpatient Medications on File Prior to Visit  Medication Sig Dispense Refill  . meclizine (ANTIVERT) 25 MG tablet Take 1 tablet (25 mg total) by mouth 3 (three) times daily as needed for up to 15 doses for dizziness. (Patient not taking: Reported on 04/24/2020) 15 tablet 0   No current facility-administered medications on file prior to visit.        ROS:  All others reviewed and negative.  Objective        PE:  BP (!) 148/90 (BP Location: Left Arm, Patient Position: Sitting, Cuff Size: Large)   Pulse 72   Ht 6\' 2"  (1.88 m)   Wt 214 lb (97.1 kg)   BMI 27.48 kg/m        Orthostatic today  Lying 172.110  Then standing 148/88  Constitutional: Pt appears in NAD               HENT: Head: NCAT.                Right Ear: External ear normal.                 Left Ear: External ear normal.                Eyes: . Pupils are equal, round, and reactive to light. Conjunctivae and EOM are normal               Nose: without d/c or deformity               Neck: Neck supple. Gross normal ROM               Cardiovascular: Normal rate and regular rhythm.                 Pulmonary/Chest: Effort normal and breath sounds without rales or wheezing.                Abd:  Soft, NT, ND, + BS, no organomegaly               Neurological: Pt is alert. At baseline orientation, motor grossly intact               Skin: Skin is warm. No rashes, no other new lesions, LE edema - none               Psychiatric: Pt behavior is normal without agitation   Micro: none  Cardiac tracings I have personally interpreted today:  none  Pertinent Radiological findings  (summarize): none   Lab Results  Component Value Date   WBC 8.7 04/24/2020   HGB 14.7 04/24/2020   HCT 43.1 04/24/2020   PLT 303.0 04/24/2020   GLUCOSE 77 04/24/2020   CHOL 188 04/24/2020   TRIG 121.0 04/24/2020   HDL 48.10 04/24/2020   LDLCALC 116 (H) 04/24/2020   ALT 23 04/24/2020   AST 19 04/24/2020   NA 132 (L) 04/24/2020   K 4.4 04/24/2020   CL 97 04/24/2020   CREATININE 0.94 04/24/2020   BUN 16 04/24/2020   CO2 32 04/24/2020   TSH 1.61 04/24/2020   PSA 5.16 (H) 04/24/2020   HGBA1C 6.1 04/24/2020   Assessment/Plan:  Alexander Pierce is a 70 y.o. White or Caucasian [1] male with  has a past medical history of ALLERGIC RHINITIS (06/29/2007), Allergy, HYPERLIPIDEMIA (06/29/2007), and Right inguinal hernia (06/24/2011).  Encounter for well adult exam with abnormal findings Age and sex appropriate education and counseling updated with regular exercise and diet Referrals for preventative services - none needed Immunizations addressed - declines covid vax and prevnar 13 Smoking counseling  - none needed Evidence for depression or other mood disorder - none significant Most recent labs reviewed. I have personally reviewed and have noted: 1) the patient's medical and social history 2) The patient's current medications and supplements 3) The patient's height, weight, and BMI have been recorded in the chart   Vitamin D deficiency Last vitamin D Lab Results  Component Value Date   VD25OH 28.73 (L) 04/24/2020   Low to start oral replacement   Orthostasis Recent dizzines improved - ? orthostasis vs vertigo, not better with antivert and BP seems to point more towards orthostasis, feels much improved today at any rate, encouraged more fluids, hold antivert prn,  to f/u  any worsening symptoms or concerns  Increased prostate specific antigen (PSA) velocity For f/u psa  Hyperlipidemia Lab Results  Component Value Date   LDLCALC 116 (H) 04/24/2020   Stable, pt to continue  current low chol diet, declines statin   Hyperglycemia Lab Results  Component Value Date   HGBA1C 6.1 04/24/2020   Stable, pt to continue current medical treatment  - diet   Blood pressure elevated without history of HTN bp elevated consistently, dw pt, declines new BP med today, encouraged to f/u bp at home and next visit, with goal at least < 140'90  Followup: Return in about 6 months (around 10/24/2020).  [name], MD 04/24/2020 9:36 PM [facility] [place] - [facility]

## 2020-04-24 NOTE — Assessment & Plan Note (Signed)
Age and sex appropriate education and counseling updated with regular exercise and diet Referrals for preventative services - none needed Immunizations addressed - declines covid vax and prevnar 13 Smoking counseling  - none needed Evidence for depression or other mood disorder - none significant Most recent labs reviewed. I have personally reviewed and have noted: 1) the patient's medical and social history 2) The patient's current medications and supplements 3) The patient's height, weight, and BMI have been recorded in the chart

## 2020-04-24 NOTE — Assessment & Plan Note (Signed)
Lab Results  Component Value Date   LDLCALC 116 (H) 04/24/2020   Stable, pt to continue current low chol diet, declines statin

## 2020-04-24 NOTE — Assessment & Plan Note (Signed)
Last vitamin D Lab Results  Component Value Date   VD25OH 28.73 (L) 04/24/2020   Low to start oral replacement

## 2020-04-28 ENCOUNTER — Other Ambulatory Visit: Payer: Self-pay

## 2020-04-28 ENCOUNTER — Ambulatory Visit
Admission: EM | Admit: 2020-04-28 | Discharge: 2020-04-28 | Disposition: A | Discharge status: Home / Self Care | Payer: BC Managed Care – PPO | Attending: Emergency Medicine | Admitting: Emergency Medicine

## 2020-04-28 ENCOUNTER — Encounter: Payer: Self-pay | Admitting: Emergency Medicine

## 2020-04-28 DIAGNOSIS — L239 Allergic contact dermatitis, unspecified cause: Secondary | ICD-10-CM | POA: Diagnosis not present

## 2020-04-28 DIAGNOSIS — R21 Rash and other nonspecific skin eruption: Secondary | ICD-10-CM

## 2020-04-28 MED ORDER — CETIRIZINE HCL 10 MG PO CAPS: 10.0000 mg | ORAL_CAPSULE | Freq: Every day | ORAL | 0 refills | Status: DC

## 2020-04-28 MED ORDER — PREDNISONE 10 MG PO TABS: ORAL_TABLET | ORAL | 0 refills | Status: DC

## 2020-04-28 MED ORDER — TRIAMCINOLONE ACETONIDE 0.1 % EX CREA: 1.0000 "application " | TOPICAL_CREAM | Freq: Two times a day (BID) | CUTANEOUS | 0 refills | Status: DC

## 2020-04-28 NOTE — ED Triage Notes (Signed)
Pt here for rash to torso that is red and itchy x 1 week

## 2020-04-28 NOTE — Discharge Instructions (Signed)
Begin prednisone taper x6 days-begin with 6 tablets on day 1, decrease by 1 tablet each day until complete-6, 5, 4, 3, 2, 1-take with food and earlier in the day if possible Continue antihistamines as well-daily cetirizine/Zyrtec or loratadine/Claritin, supplement Benadryl in the evening May triamcinolone cream twice daily on areas of persistent itching despite using oral steroids Follow-up if not improving or worsening

## 2020-04-28 NOTE — ED Provider Notes (Signed)
[facility]    CSN: [id number] Arrival date & time: 04/28/20  1546      History   Chief Complaint Chief Complaint  Patient presents with  . Rash    HPI Alexander Pierce is a 71 y.o. male presenting today for evaluation of a rash.  Reports over the past 4 days has developed itchy red rash to trunk and upper extremities.  Associated itching.  Denies difficulty breathing shortness of breath.  Denies involvement of face.  Denies any new foods, medicines, new hygiene products.  Denies changing soaps lotions or detergents.  Does have intra-articular knee joint injection on 4/1 as well as was seen in emergency room the following day for dizziness and given meclizine and Valium, does not taken any other new medicine since over the past week.  HPI  Past Medical History:  Diagnosis Date  . ALLERGIC RHINITIS 06/29/2007   Qualifier: Diagnosis of  By: Jonny Ruiz MD, Len Blalock   . Allergy    seasonal  . HYPERLIPIDEMIA 06/29/2007   Qualifier: Diagnosis of  By: Jonny Ruiz MD, Len Blalock   . Right inguinal hernia 06/24/2011    Patient Active Problem List   Diagnosis Date Noted  . Hyperglycemia 04/24/2020  . Orthostasis 04/24/2020  . Blood pressure elevated without history of HTN 02/27/2019  . Vitamin D deficiency 02/27/2019  . Low back pain 02/27/2019  . Finger infection 07/20/2018  . Right lateral epicondylitis 01/23/2018  . Hyponatremia 05/04/2016  . Tick bite of left upper arm 05/03/2016  . Fever 05/03/2016  . Upper respiratory tract infection 05/03/2016  . 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 Increased prostate specific antigen (PSA) velocity 12/25/2014  . Perianal mass 12/25/2014  . 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 Primary localized osteoarthrosis, lower leg 02/15/2013  . Screening for other and unspecified cardiovascular conditions 02/07/2013  . 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 Right knee pain 02/07/2013  . Bilateral inguinal hernia, right  larger than left. 07/09/2011  . Skin lesion 06/24/2011  . Encounter for well adult exam with abnormal findings 06/19/2011  . Hyperlipidemia 06/29/2007  . Allergic rhinitis 06/29/2007    Past Surgical History:  Procedure Laterality Date  . INGUINAL HERNIA REPAIR    . sinus surgury  1992   dr Haroldine Laws       Home Medications    Prior to Admission medications   Medication Sig Start Date End Date Taking? Authorizing Provider  Cetirizine HCl 10 MG CAPS Take 1 capsule (10 mg total) by mouth daily for 10 days. 04/28/20 05/08/20 Yes Patt Steinhardt C, PA-C  predniSONE (DELTASONE) 10 MG tablet Begin with 6 tabs on day 1, 5 tab on day 2, 4 tab on day 3, 3 tab on day 4, 2 tab on day 5, 1 tab on day 6-take with food 04/28/20  Yes Joory Gough C, PA-C  triamcinolone cream (KENALOG) 0.1 % Apply 1 application topically 2 (two) times daily. 04/28/20  Yes Tyreona Panjwani, Junius Creamer, PA-C    Family History Family History  Problem Relation Age of Onset  . Parkinsonism Other   . Prostate cancer Other   . Hypertension Other   . Colon cancer Neg Hx   . Rectal cancer Neg Hx   . Stomach cancer Neg Hx     Social History Social History   Tobacco Use  . Smoking status: Never Smoker  . Smokeless tobacco: Never Used  Substance Use Topics  . Alcohol use: No  . Drug use:  No     Allergies   Acetaminophen, Aspirin, Ibuprofen, and Penicillins   Review of Systems Review of Systems  Constitutional: Negative for fatigue and fever.  Eyes: Negative for redness, itching and visual disturbance.  Respiratory: Negative for shortness of breath.   Cardiovascular: Negative for chest pain and leg swelling.  Gastrointestinal: Negative for nausea and vomiting.  Musculoskeletal: Negative for arthralgias and myalgias.  Skin: Positive for color change and rash. Negative for wound.  Neurological: Negative for dizziness, syncope, weakness, light-headedness and headaches.     Physical Exam Triage Vital Signs ED  Triage Vitals  Enc Vitals Group     BP      Pulse      Resp      Temp      Temp src      SpO2      Weight      Height      Head Circumference      Peak Flow      Pain Score      Pain Loc      Pain Edu?      Excl. in GC?    No data found.  Updated Vital Signs BP (!) 186/94 (BP Location: Left Arm)   Pulse 74   Temp 98 F (36.7 C) (Oral)   Resp 18   SpO2 95%   Visual Acuity Right Eye Distance:   Left Eye Distance:   Bilateral Distance:    Right Eye Near:   Left Eye Near:    Bilateral Near:     Physical Exam Vitals and nursing note reviewed.  Constitutional:      Appearance: He is well-developed.     Comments: No acute distress  HENT:     Head: Normocephalic and atraumatic.     Nose: Nose normal.     Mouth/Throat:     Comments: Oral mucosa pink and moist, no tonsillar enlargement or exudate. Posterior pharynx patent and nonerythematous, no uvula deviation or swelling. Normal phonation.  No lesions on oral mucosa Eyes:     Conjunctiva/sclera: Conjunctivae normal.  Cardiovascular:     Rate and Rhythm: Normal rate.  Pulmonary:     Effort: Pulmonary effort is normal. No respiratory distress.  Abdominal:     General: There is no distension.  Musculoskeletal:        General: Normal range of motion.     Cervical back: Neck supple.  Skin:    General: Skin is warm and dry.     Comments: Trunk with diffuse erythematous macular slightly raised rash, extending to proximal upper extremities  Neurological:     Mental Status: He is alert and oriented to person, place, and time.      UC Treatments / Results  Labs (all labs ordered are listed, but only abnormal results are displayed) Labs Reviewed - No data to display  EKG   Radiology No results found.  Procedures Procedures (including critical care time)  Medications Ordered in UC Medications - No data to display  Initial Impression / Assessment and Plan / UC Course  I have reviewed the triage vital  signs and the nursing notes.  Pertinent labs & imaging results that were available during my care of the patient were reviewed by me and considered in my medical decision making (see chart for details).     Rash appears allergic in nature-no airway compromise, unclear trigger, do not suspect likely related to joint injection or meds from ED given this was  over 1 week ago.  Encouraged antihistamines, placing on prednisone taper and monitoring for gradual resolution.  Discussed strict return precautions. Patient verbalized understanding and is agreeable with plan.  Final Clinical Impressions(s) / UC Diagnoses   Final diagnoses:  Rash and nonspecific skin eruption  Allergic contact dermatitis, unspecified trigger     Discharge Instructions     Begin prednisone taper x6 days-begin with 6 tablets on day 1, decrease by 1 tablet each day until complete-6, 5, 4, 3, 2, 1-take with food and earlier in the day if possible Continue antihistamines as well-daily cetirizine/Zyrtec or loratadine/Claritin, supplement Benadryl in the evening May triamcinolone cream twice daily on areas of persistent itching despite using oral steroids Follow-up if not improving or worsening    ED Prescriptions    Medication Sig Dispense Auth. Provider   predniSONE (DELTASONE) 10 MG tablet Begin with 6 tabs on day 1, 5 tab on day 2, 4 tab on day 3, 3 tab on day 4, 2 tab on day 5, 1 tab on day 6-take with food 21 tablet Edessa Jakubowicz C, PA-C   Cetirizine HCl 10 MG CAPS Take 1 capsule (10 mg total) by mouth daily for 10 days. 10 capsule Marenda Accardi C, PA-C   triamcinolone cream (KENALOG) 0.1 % Apply 1 application topically 2 (two) times daily. 30 g Arleigh Odowd, [place] C, PA-C     PDMP not reviewed this encounter.   Lew Dawes, PA-C 04/28/20 1721

## 2020-04-29 NOTE — Telephone Encounter (Signed)
Addendum of note,  Did state he felt he made some improvement nearly a year ago.  Try again for approval. Otherwise if declined, will need to tell patient

## 2020-04-30 NOTE — Telephone Encounter (Signed)
Faxed appeal to [facility]

## 2020-06-03 ENCOUNTER — Ambulatory Visit (INDEPENDENT_AMBULATORY_CARE_PROVIDER_SITE_OTHER): Payer: BC Managed Care – PPO

## 2020-06-03 ENCOUNTER — Encounter: Payer: Self-pay | Admitting: Internal Medicine

## 2020-06-03 ENCOUNTER — Other Ambulatory Visit: Payer: Self-pay

## 2020-06-03 ENCOUNTER — Ambulatory Visit: Payer: BC Managed Care – PPO | Admitting: Internal Medicine

## 2020-06-03 VITALS — BP 128/72 | HR 65 | Temp 98.1°F | Ht 74.0 in | Wt 207.8 lb

## 2020-06-03 DIAGNOSIS — R059 Cough, unspecified: Secondary | ICD-10-CM

## 2020-06-03 DIAGNOSIS — R739 Hyperglycemia, unspecified: Secondary | ICD-10-CM

## 2020-06-03 DIAGNOSIS — M545 Low back pain, unspecified: Secondary | ICD-10-CM | POA: Diagnosis not present

## 2020-06-03 IMAGING — DX DG CHEST 2V
3 series · 3 of 3 positions shown · non-contrast
Comparison: No priors.

CLINICAL DATA: 69-year-old male with history of productive cough
and recent fever with low back pain.

EXAM:
CHEST - 2 VIEW

[chest pa (1 of 2)]
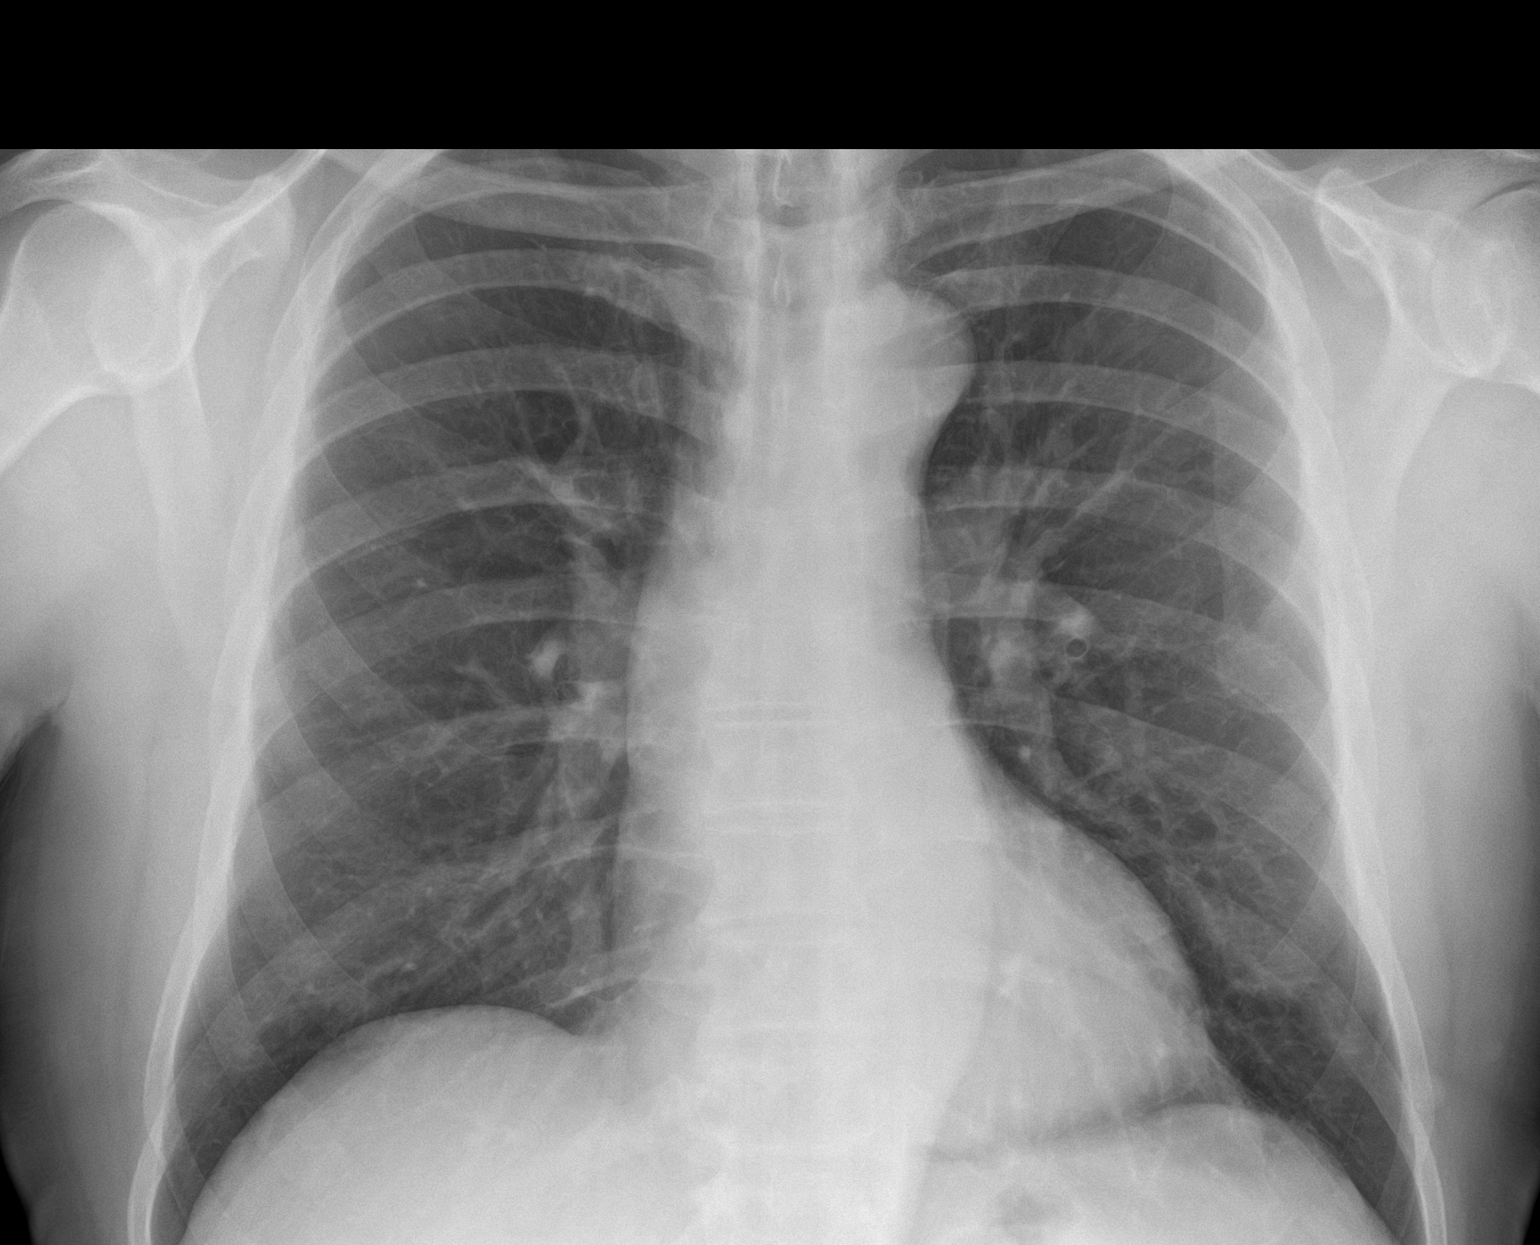

[chest lat]
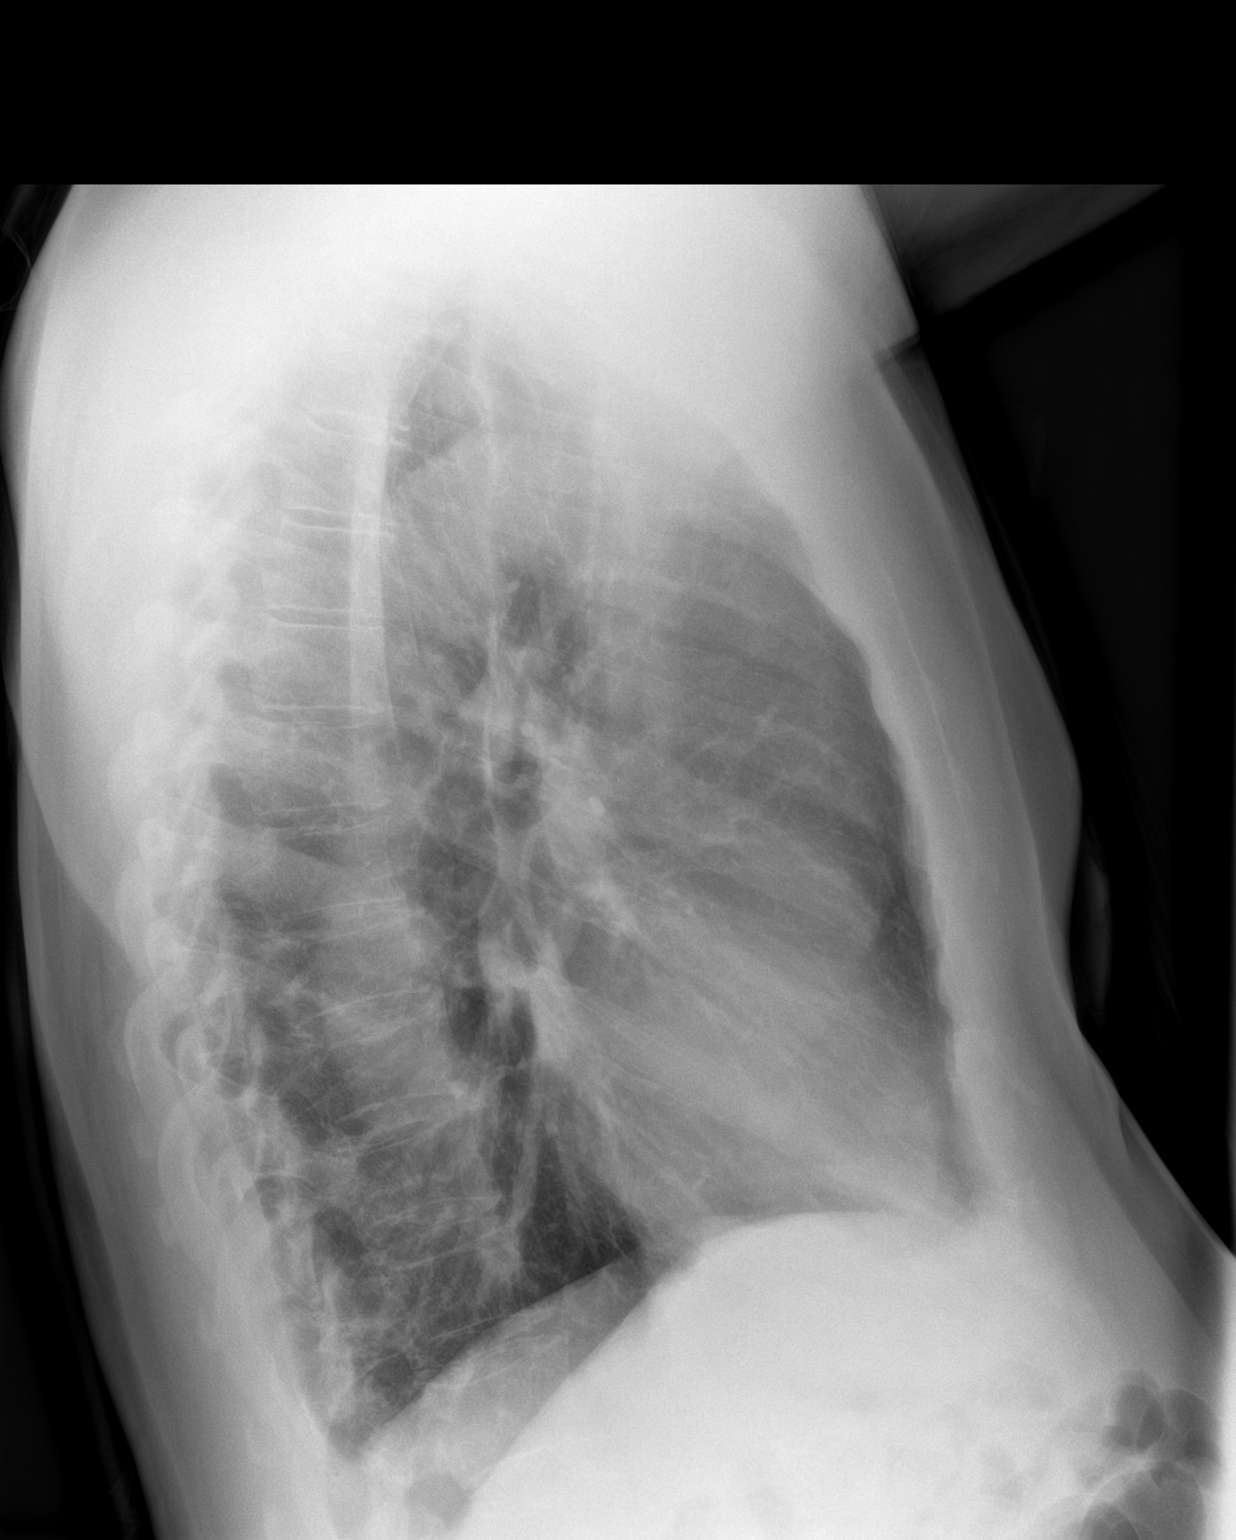

[chest pa (2 of 2)]
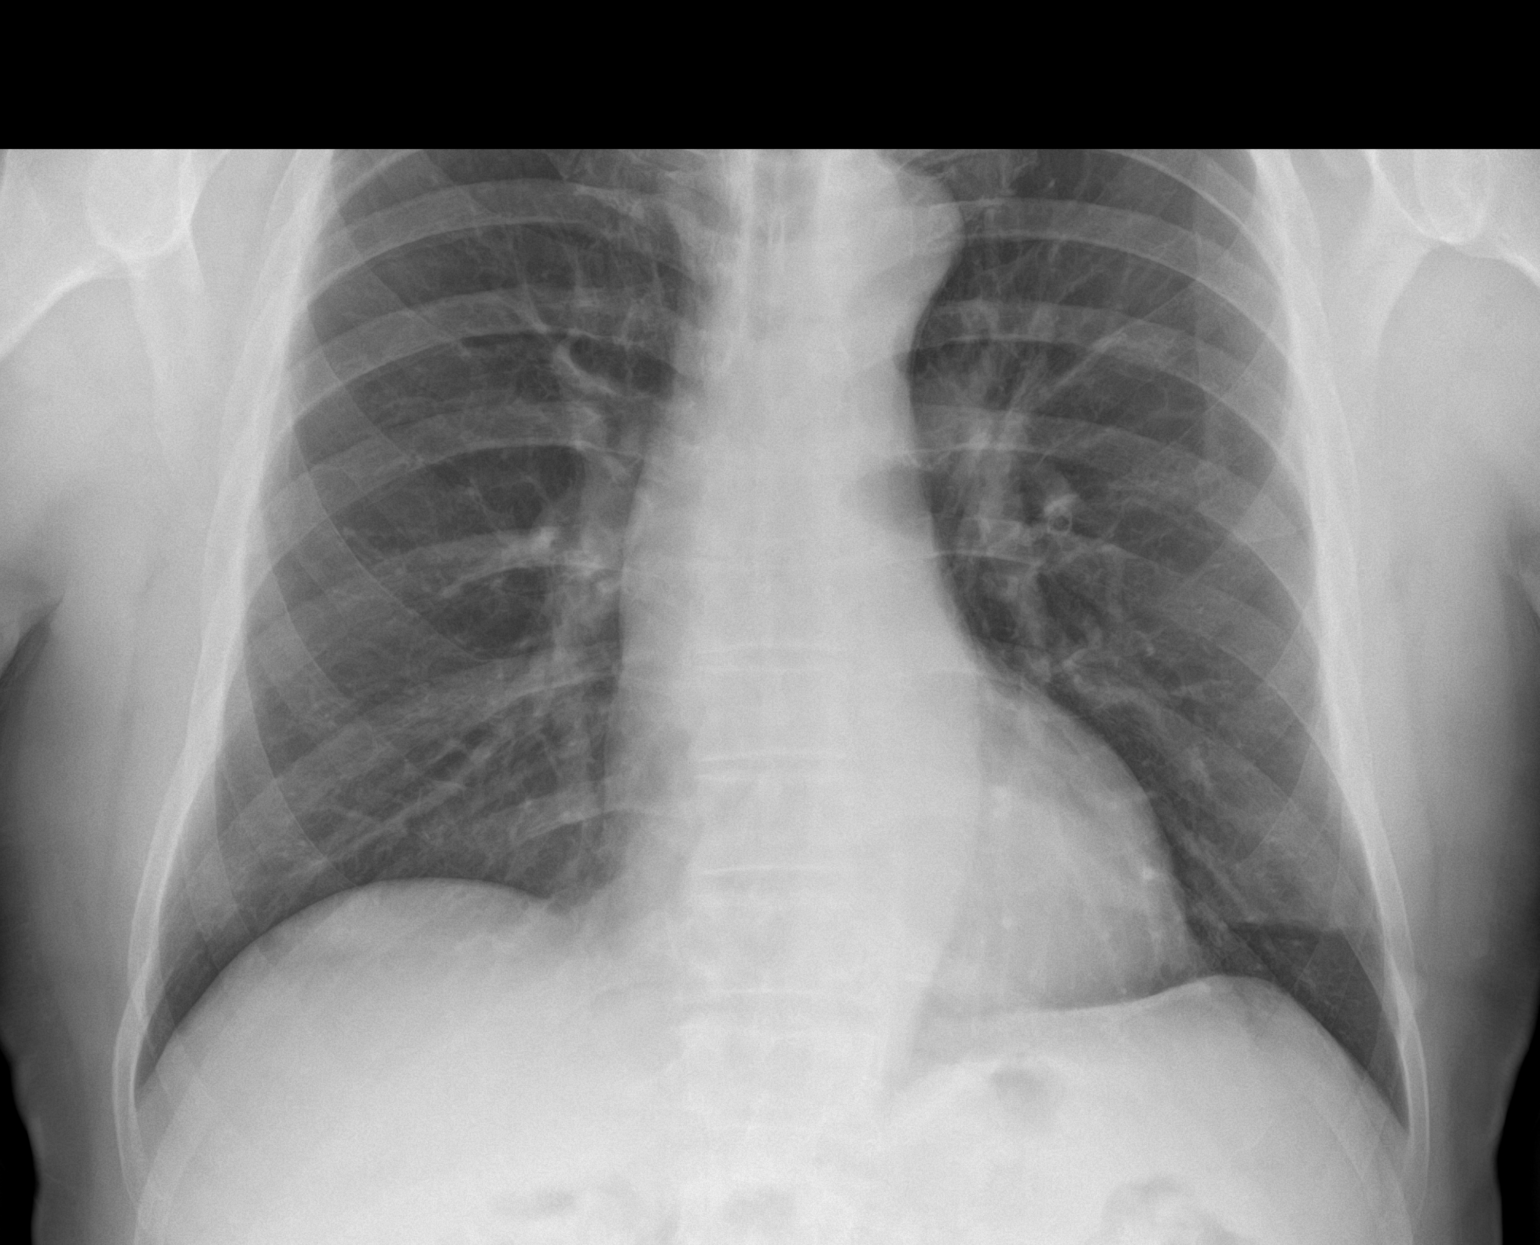

[3 of 3 positions shown; findings below may reference images not displayed]

FINDINGS: Lung volumes are normal. No consolidative airspace disease. No
pleural effusions. No pneumothorax. No pulmonary nodule or mass
noted. Pulmonary vasculature and the cardiomediastinal silhouette
are within normal limits.
IMPRESSION: No radiographic evidence of acute cardiopulmonary disease.

## 2020-06-03 NOTE — Progress Notes (Signed)
Patient ID: Alexander Pierce, male   DOB: 1950-07-10, 70 y.o.   MRN: [id number]        Chief Complaint: cough and LBP       HPI:  Alexander Pierce is a 70 y.o. male here with recent campaigning for [employer], but has been home without travel for 6 days, now with flu like feeling, chest congestion, feverish, low energy and appetite and non prod cough;  no chills and Pt denies chest pain, increased sob or doe, wheezing, orthopnea, PND, increased LE swelling, palpitations, dizziness or syncope.   Pt denies polydipsia, polyuria, or new focal neuro s/s. Wife ill with similar recently now resolved,  Also incidentally with 3 days onset midline lbp mild to mod, intermittent, and no bowel or bladder change, fever, wt loss,  worsening LE pain/numbness/weakness, gait change or falls.       Wt Readings from Last 3 Encounters:  06/03/20 207 lb 12.8 oz (94.3 kg)  04/24/20 214 lb (97.1 kg)  04/19/20 218 lb (98.9 kg)   BP Readings from Last 3 Encounters:  06/03/20 128/72  04/28/20 (!) 186/94  04/24/20 (!) 148/90         Past Medical History:  Diagnosis Date  . ALLERGIC RHINITIS 06/29/2007   Qualifier: Diagnosis of  By: Jonny Ruiz MD, Len Blalock   . Allergy    seasonal  . HYPERLIPIDEMIA 06/29/2007   Qualifier: Diagnosis of  By: Jonny Ruiz MD, Len Blalock   . Right inguinal hernia 06/24/2011   Past Surgical History:  Procedure Laterality Date  . INGUINAL HERNIA REPAIR    . sinus surgury  1992   dr Haroldine Laws    reports that he has never smoked. He has never used smokeless tobacco. He reports that he does not drink alcohol and does not use drugs. family history includes Hypertension in an other family member; Parkinsonism in an other family member; Prostate cancer in an other family member. Allergies  Allergen Reactions  . Acetaminophen     REACTION: eyes swell  . Aspirin     REACTION: eyes swell  . Ibuprofen     REACTION: eyes swell  . Penicillins     REACTION: pt cant remember   Current Outpatient  Medications on File Prior to Visit  Medication Sig Dispense Refill  . Cetirizine HCl 10 MG CAPS Take 1 capsule (10 mg total) by mouth daily for 10 days. 10 capsule 0   No current facility-administered medications on file prior to visit.        ROS:  All others reviewed and negative.  Objective        PE:  BP 128/72 (BP Location: Right Arm, Patient Position: Sitting, Cuff Size: Large)   Pulse 65   Temp 98.1 F (36.7 C) (Oral)   Ht 6\' 2"  (1.88 m)   Wt 207 lb 12.8 oz (94.3 kg)   SpO2 96%   BMI 26.68 kg/m                 Constitutional: Pt appears in NAD               HENT: Head: NCAT.                Right Ear: External ear normal.                 Left Ear: External ear normal.                Eyes: . Pupils are  equal, round, and reactive to light. Conjunctivae and EOM are normal               Nose: without d/c or deformity               Neck: Neck supple. Gross normal ROM               Cardiovascular: Normal rate and regular rhythm.                 Pulmonary/Chest: Effort normal and breath sounds without rales or wheezing.                Abd:  Soft, NT, ND, + BS, no organomegaly               Neurological: Pt is alert. At baseline orientation, motor grossly intact               Skin: Skin is warm. No rashes, no other new lesions, LE edema - none               Psychiatric: Pt behavior is normal without agitation   Micro: none  Cardiac tracings I have personally interpreted today:  none  Pertinent Radiological findings (summarize): none   Lab Results  Component Value Date   WBC 8.7 04/24/2020   HGB 14.7 04/24/2020   HCT 43.1 04/24/2020   PLT 303.0 04/24/2020   GLUCOSE 77 04/24/2020   CHOL 188 04/24/2020   TRIG 121.0 04/24/2020   HDL 48.10 04/24/2020   LDLCALC 116 (H) 04/24/2020   ALT 23 04/24/2020   AST 19 04/24/2020   NA 132 (L) 04/24/2020   K 4.4 04/24/2020   CL 97 04/24/2020   CREATININE 0.94 04/24/2020   BUN 16 04/24/2020   CO2 32 04/24/2020   TSH 1.61  04/24/2020   PSA 5.16 (H) 04/24/2020   HGBA1C 6.1 04/24/2020   Assessment/Plan:  Alexander Pierce is a 70 y.o. White or Caucasian [1] male with  has a past medical history of ALLERGIC RHINITIS (06/29/2007), Allergy, HYPERLIPIDEMIA (06/29/2007), and Right inguinal hernia (06/24/2011).  Cough I suspect recent viral illness, for CXR r/o pna,  to f/u any worsening symptoms or concerns  Low back pain Suspect msk strain benign, exam benign, differential includes discitis related to infection but seems less likely overall, cont to follow  Hyperglycemia Lab Results  Component Value Date   HGBA1C 6.1 04/24/2020   Stable, pt to continue current medical treatment  - diner   Followup: Return if symptoms worsen or fail to improve.  [name], MD 06/07/2020 4:25 PM [facility] - [facility]

## 2020-06-03 NOTE — Patient Instructions (Signed)
Please continue all other medications as before, and refills have been done if requested.  Please have the pharmacy call with any other refills you may need.  Please continue your efforts at being more active, low cholesterol diet, and weight control.  Please keep your appointments with your specialists as you may have planned  Please go to the XRAY Department in the first floor for the x-ray testing  You will be contacted by phone if any changes need to be made immediately.  Otherwise, you will receive a letter about your results with an explanation, but please check with MyChart first.  Please remember to sign up for MyChart if you have not done so, as this will be important to you in the future with finding out test results, communicating by private email, and scheduling acute appointments online when needed.     

## 2020-06-04 ENCOUNTER — Encounter: Payer: Self-pay | Admitting: Internal Medicine

## 2020-06-07 ENCOUNTER — Encounter: Payer: Self-pay | Admitting: Internal Medicine

## 2020-06-07 NOTE — Assessment & Plan Note (Signed)
I suspect recent viral illness, for CXR r/o pna,  to f/u any worsening symptoms or concerns

## 2020-06-07 NOTE — Assessment & Plan Note (Signed)
Lab Results  Component Value Date   HGBA1C 6.1 04/24/2020   Stable, pt to continue current medical treatment  - diner

## 2020-06-07 NOTE — Assessment & Plan Note (Signed)
Suspect msk strain benign, exam benign, differential includes discitis related to infection but seems less likely overall, cont to follow

## 2020-07-15 NOTE — Progress Notes (Signed)
[name] [address] [place] [address] Phone: [phone] Subjective:   I Ronelle Nigh am serving as a Neurosurgeon for Dr. Antoine Primas.  This visit occurred during the SARS-CoV-2 public health emergency.  Safety protocols were in place, including screening questions prior to the visit, additional usage of staff PPE, and extensive cleaning of exam room while observing appropriate contact time as indicated for disinfecting solutions.   I'm seeing this patient by the request  of:  [name], MD  CC: Knee pain follow-up  BOF:[id number]  04/18/2020 Chronic problem with exacerbation.  Drained over 80 cc of straw-colored fluid.  Discussed with patient reduces OA stability brace.  Discussed which activities to do which wants to avoid.  Discussed compression.  Follow-up with me again in 4 to 8 weeks patient has had viscosupplementation previously with intermittent improvement we will get approval just in case.  Patient did have improvement for nearly a whole year with the viscosupplementation and help decrease the amount of over-the-counter medications he was using.  Once again follow-up in 2 months  Update 07/16/2020 Alexander Pierce is a 70 y.o. male coming in with complaint of B knee pain. Patient states his knees are doing ok. Some swelling in the left knee. States he got a steroid injection and that night and next day he got sick and states he was dizzy. Went to ER. States it was vertigo and was given medication. States the next day he was feeling better. Doesn't want anymore steroid injections.  Patient is wondering what the next steps are     Past Medical History:  Diagnosis Date   ALLERGIC RHINITIS 06/29/2007   Qualifier: Diagnosis of  By: Jonny Ruiz MD, Len Blalock    Allergy    seasonal   HYPERLIPIDEMIA 06/29/2007   Qualifier: Diagnosis of  By: Jonny Ruiz MD, Len Blalock    Right inguinal hernia 06/24/2011   Past Surgical History:  Procedure Laterality Date    INGUINAL HERNIA REPAIR     sinus surgury  1992   dr Haroldine Laws   Social History   Socioeconomic History   Marital status: Married    Spouse name: Not on file   Number of children: Not on file   Years of education: Not on file   Highest education level: Not on file  Occupational History   Not on file  Tobacco Use   Smoking status: Never   Smokeless tobacco: Never  Substance and Sexual Activity   Alcohol use: No   Drug use: No   Sexual activity: Not on file  Other Topics Concern   Not on file  Social History Narrative   Not on file   Social Determinants of Health   Financial Resource Strain: Not on file  Food Insecurity: Not on file  Transportation Needs: Not on file  Physical Activity: Not on file  Stress: Not on file  Social Connections: Not on file   Allergies  Allergen Reactions   Acetaminophen     REACTION: eyes swell   Aspirin     REACTION: eyes swell   Ibuprofen     REACTION: eyes swell   Penicillins     REACTION: pt cant remember   Family History  Problem Relation Age of Onset   Parkinsonism Other    Prostate cancer Other    Hypertension Other    Colon cancer Neg Hx    Rectal cancer Neg Hx    Stomach cancer Neg Hx  Current Outpatient Medications (Respiratory):    Cetirizine HCl 10 MG CAPS, Take 1 capsule (10 mg total) by mouth daily for 10 days.     Reviewed prior external information including notes and imaging from  primary care provider As well as notes that were available from care everywhere and other healthcare systems.  Past medical history, social, surgical and family history all reviewed in electronic medical record.  No pertanent information unless stated regarding to the chief complaint.   Review of Systems:  No headache, visual changes, nausea, vomiting, diarrhea, constipation, dizziness, abdominal pain, skin rash, fevers, chills, night sweats, weight loss, swollen lymph nodes, body aches, joint swelling, chest pain,  shortness of breath, mood changes. POSITIVE muscle aches, joint swelling  Objective  Blood pressure 134/80, pulse 65, height 6\' 2"  (1.88 m), weight 207 lb (93.9 kg), SpO2 97 %.   General: No apparent distress alert and oriented x3 mood and affect normal, dressed appropriately.  HEENT: Pupils equal, extraocular movements intact  Respiratory: Patient's speak in full sentences and does not appear short of breath  Cardiovascular: No lower extremity edema, non tender, no erythema  Gait mild antalgic Left knee still has swelling noted over the patellofemoral joint.  Does have limited range of motion in all planes.  Mild instability noted.  This is with valgus and varus force.  Neurovascular intact distally.   Impression and Recommendations:    The above documentation has been reviewed and is accurate and complete , DO

## 2020-07-16 ENCOUNTER — Other Ambulatory Visit: Payer: Self-pay

## 2020-07-16 ENCOUNTER — Encounter: Payer: Self-pay | Admitting: Family Medicine

## 2020-07-16 ENCOUNTER — Ambulatory Visit: Payer: BC Managed Care – PPO | Admitting: Family Medicine

## 2020-07-16 DIAGNOSIS — M170 Bilateral primary osteoarthritis of knee: Secondary | ICD-10-CM

## 2020-07-16 NOTE — Assessment & Plan Note (Signed)
Patient is doing viscosupplementation previously.  Would like to hold on any other type of steroid injection.  Patient will also follow-up in the viscosupplementation.  Does have swelling of the left knee noted again.  We will need to consider the possibility of surgical intervention.  Patient will follow up with me again in 5 to 6 weeks otherwise.

## 2020-07-16 NOTE — Patient Instructions (Addendum)
Good to see you Oofos or hoka in the house Lets keep watching the knees See me again in 5 weeks call me if it gets worse we can consider gel injection

## 2020-09-02 NOTE — Progress Notes (Signed)
[facility] [address] [place] [address] Phone: [phone] Subjective:    I'm seeing this patient by the request  of:  Alexander Levins, MD  CC: Bilateral knee pain follow-up  [id number]  07/16/2020 Patient is doing viscosupplementation previously.  Would like to hold on any other type of steroid injection.  Patient will also follow-up in the viscosupplementation.  Does have swelling of the left knee noted again.  We will need to consider the possibility of surgical intervention.  Patient will follow up with me again in 5 to 6 weeks otherwise.  Update 09/03/2020 Alexander Pierce is a 70 y.o. male coming in with complaint of B knee pain. Patient states that his knees are doing great. Patient states he can walk well and will get minor discomfort intermittently, but was able to drive cross country with no issues.  Patient is happy at the moment and does not feel he needs any intervention.     Past Medical History:  Diagnosis Date   ALLERGIC RHINITIS 06/29/2007   Qualifier: Diagnosis of  By: Alexander Ruiz MD, Alexander Pierce    Allergy    seasonal   HYPERLIPIDEMIA 06/29/2007   Qualifier: Diagnosis of  By: Alexander Ruiz MD, Alexander Pierce    Right inguinal hernia 06/24/2011   Past Surgical History:  Procedure Laterality Date   INGUINAL HERNIA REPAIR     sinus surgury  1992   dr Alexander Pierce   Social History   Socioeconomic History   Marital status: Married    Spouse name: Not on file   Number of children: Not on file   Years of education: Not on file   Highest education level: Not on file  Occupational History   Not on file  Tobacco Use   Smoking status: Never   Smokeless tobacco: Never  Substance and Sexual Activity   Alcohol use: No   Drug use: No   Sexual activity: Not on file  Other Topics Concern   Not on file  Social History Narrative   Not on file   Social Determinants of Health   Financial Resource Strain: Not on file  Food Insecurity: Not on file   Transportation Needs: Not on file  Physical Activity: Not on file  Stress: Not on file  Social Connections: Not on file   Allergies  Allergen Reactions   Acetaminophen     REACTION: eyes swell   Aspirin     REACTION: eyes swell   Ibuprofen     REACTION: eyes swell   Penicillins     REACTION: pt cant remember   Family History  Problem Relation Age of Onset   Parkinsonism Other    Prostate cancer Other    Hypertension Other    Colon cancer Neg Hx    Rectal cancer Neg Hx    Stomach cancer Neg Hx       Current Outpatient Medications (Respiratory):    Cetirizine HCl 10 MG CAPS, Take 1 capsule (10 mg total) by mouth daily for 10 days.     Reviewed prior external information including notes and imaging from  primary care provider As well as notes that were available from care everywhere and other healthcare systems.  Past medical history, social, surgical and family history all reviewed in electronic medical record.  No pertanent information unless stated regarding to the chief complaint.   Review of Systems:  No headache, visual changes, nausea, vomiting, diarrhea, constipation, dizziness, abdominal pain, skin rash, fevers, chills,  night sweats, weight loss, swollen lymph nodes, body aches, joint swelling, chest pain, shortness of breath, mood changes. POSITIVE muscle aches  Objective  Blood pressure 122/86, pulse 67, height 6\' 2"  (1.88 m), weight 213 lb (96.6 kg), SpO2 96 %.   General: No apparent distress alert and oriented x3 mood and affect normal, dressed appropriately.  HEENT: Pupils equal, extraocular movements intact  Respiratory: Patient's speak in full sentences and does not appear short of breath  Cardiovascular: No lower extremity edema, non tender, no erythema  Gait normal with good balance and coordination.  MSK: Left knee exam shows the patient does have arthritic changes.  Very mild trace effusion noted the patellofemoral joint.  He does have full  extension and lacks last 5 degrees of flexion.  Still instability noted with valgus and varus force    Impression and Recommendations:    The above documentation has been reviewed and is accurate and complete , DO

## 2020-09-03 ENCOUNTER — Encounter: Payer: Self-pay | Admitting: Family Medicine

## 2020-09-03 ENCOUNTER — Ambulatory Visit: Payer: BC Managed Care – PPO | Admitting: Family Medicine

## 2020-09-03 ENCOUNTER — Other Ambulatory Visit: Payer: Self-pay

## 2020-09-03 DIAGNOSIS — M170 Bilateral primary osteoarthritis of knee: Secondary | ICD-10-CM | POA: Diagnosis not present

## 2020-09-03 NOTE — Patient Instructions (Signed)
Have appt in 2 months just in case

## 2020-09-03 NOTE — Assessment & Plan Note (Signed)
Patient is doing much better overall at this moment.  I would not make any significant changes.  Patient has no significant swelling.  Patient is ambulating well.  Wearing a compression sleeve.  Follow-up with me again 2 months

## 2020-09-22 ENCOUNTER — Inpatient Hospital Stay (HOSPITAL_COMMUNITY)
Admission: EM | Admit: 2020-09-22 | Discharge: 2020-09-27 | DRG: 061 | Disposition: A | Discharge status: Home / Self Care | Payer: BC Managed Care – PPO | Attending: Neurology | Admitting: Neurology

## 2020-09-22 ENCOUNTER — Emergency Department (HOSPITAL_COMMUNITY): Payer: BC Managed Care – PPO

## 2020-09-22 ENCOUNTER — Other Ambulatory Visit: Payer: Self-pay

## 2020-09-22 DIAGNOSIS — Z8249 Family history of ischemic heart disease and other diseases of the circulatory system: Secondary | ICD-10-CM | POA: Diagnosis not present

## 2020-09-22 DIAGNOSIS — G9341 Metabolic encephalopathy: Secondary | ICD-10-CM | POA: Diagnosis present

## 2020-09-22 DIAGNOSIS — J9601 Acute respiratory failure with hypoxia: Secondary | ICD-10-CM | POA: Diagnosis not present

## 2020-09-22 DIAGNOSIS — Z9189 Other specified personal risk factors, not elsewhere classified: Secondary | ICD-10-CM | POA: Diagnosis not present

## 2020-09-22 DIAGNOSIS — I4891 Unspecified atrial fibrillation: Secondary | ICD-10-CM | POA: Diagnosis not present

## 2020-09-22 DIAGNOSIS — Z820 Family history of epilepsy and other diseases of the nervous system: Secondary | ICD-10-CM | POA: Diagnosis not present

## 2020-09-22 DIAGNOSIS — E876 Hypokalemia: Secondary | ICD-10-CM | POA: Diagnosis present

## 2020-09-22 DIAGNOSIS — Z5181 Encounter for therapeutic drug level monitoring: Secondary | ICD-10-CM

## 2020-09-22 DIAGNOSIS — R29810 Facial weakness: Secondary | ICD-10-CM | POA: Diagnosis present

## 2020-09-22 DIAGNOSIS — I639 Cerebral infarction, unspecified: Secondary | ICD-10-CM

## 2020-09-22 DIAGNOSIS — G459 Transient cerebral ischemic attack, unspecified: Principal | ICD-10-CM | POA: Diagnosis present

## 2020-09-22 DIAGNOSIS — Z781 Physical restraint status: Secondary | ICD-10-CM

## 2020-09-22 DIAGNOSIS — Z9282 Status post administration of tPA (rtPA) in a different facility within the last 24 hours prior to admission to current facility: Secondary | ICD-10-CM | POA: Diagnosis not present

## 2020-09-22 DIAGNOSIS — I480 Paroxysmal atrial fibrillation: Secondary | ICD-10-CM | POA: Diagnosis not present

## 2020-09-22 DIAGNOSIS — R509 Fever, unspecified: Secondary | ICD-10-CM | POA: Diagnosis not present

## 2020-09-22 DIAGNOSIS — R4702 Dysphasia: Secondary | ICD-10-CM | POA: Diagnosis present

## 2020-09-22 DIAGNOSIS — R4182 Altered mental status, unspecified: Secondary | ICD-10-CM | POA: Diagnosis not present

## 2020-09-22 DIAGNOSIS — Z880 Allergy status to penicillin: Secondary | ICD-10-CM | POA: Diagnosis not present

## 2020-09-22 DIAGNOSIS — Z20822 Contact with and (suspected) exposure to covid-19: Secondary | ICD-10-CM | POA: Diagnosis present

## 2020-09-22 DIAGNOSIS — R030 Elevated blood-pressure reading, without diagnosis of hypertension: Secondary | ICD-10-CM | POA: Diagnosis present

## 2020-09-22 DIAGNOSIS — R410 Disorientation, unspecified: Secondary | ICD-10-CM

## 2020-09-22 DIAGNOSIS — E860 Dehydration: Secondary | ICD-10-CM | POA: Diagnosis present

## 2020-09-22 DIAGNOSIS — E7800 Pure hypercholesterolemia, unspecified: Secondary | ICD-10-CM | POA: Diagnosis not present

## 2020-09-22 DIAGNOSIS — R4701 Aphasia: Secondary | ICD-10-CM | POA: Diagnosis present

## 2020-09-22 DIAGNOSIS — Z8042 Family history of malignant neoplasm of prostate: Secondary | ICD-10-CM

## 2020-09-22 DIAGNOSIS — E785 Hyperlipidemia, unspecified: Secondary | ICD-10-CM | POA: Diagnosis present

## 2020-09-22 DIAGNOSIS — E871 Hypo-osmolality and hyponatremia: Secondary | ICD-10-CM | POA: Diagnosis not present

## 2020-09-22 DIAGNOSIS — Z7902 Long term (current) use of antithrombotics/antiplatelets: Secondary | ICD-10-CM

## 2020-09-22 DIAGNOSIS — J309 Allergic rhinitis, unspecified: Secondary | ICD-10-CM | POA: Diagnosis present

## 2020-09-22 DIAGNOSIS — I350 Nonrheumatic aortic (valve) stenosis: Secondary | ICD-10-CM | POA: Diagnosis not present

## 2020-09-22 DIAGNOSIS — R7303 Prediabetes: Secondary | ICD-10-CM | POA: Diagnosis not present

## 2020-09-22 LAB — DIFFERENTIAL
Abs Immature Granulocytes: 0.07 10*3/uL (ref 0.00–0.07)
Basophils Absolute: 0 10*3/uL (ref 0.0–0.1)
Basophils Relative: 0 %
Eosinophils Absolute: 0 10*3/uL (ref 0.0–0.5)
Eosinophils Relative: 0 %
Immature Granulocytes: 1 %
Lymphocytes Relative: 5 %
Lymphs Abs: 0.5 10*3/uL — ABNORMAL LOW (ref 0.7–4.0)
Monocytes Absolute: 0.7 10*3/uL (ref 0.1–1.0)
Monocytes Relative: 7 %
Neutro Abs: 8.6 10*3/uL — ABNORMAL HIGH (ref 1.7–7.7)
Neutrophils Relative %: 87 %

## 2020-09-22 LAB — COMPREHENSIVE METABOLIC PANEL
ALT: 24 U/L (ref 0–44)
AST: 34 U/L (ref 15–41)
Albumin: 3.2 g/dL — ABNORMAL LOW (ref 3.5–5.0)
Alkaline Phosphatase: 64 U/L (ref 38–126)
Anion gap: 11 (ref 5–15)
BUN: 11 mg/dL (ref 8–23)
CO2: 20 mmol/L — ABNORMAL LOW (ref 22–32)
Calcium: 8.2 mg/dL — ABNORMAL LOW (ref 8.9–10.3)
Chloride: 86 mmol/L — ABNORMAL LOW (ref 98–111)
Creatinine, Ser: 0.97 mg/dL (ref 0.61–1.24)
GFR, Estimated: >60 mL/min (ref >60)
Glucose, Bld: 124 mg/dL — ABNORMAL HIGH (ref 70–99)
Potassium: 4.1 mmol/L (ref 3.5–5.1)
Sodium: 117 mmol/L — CL (ref 135–145)
Total Bilirubin: 1 mg/dL (ref 0.3–1.2)
Total Protein: 7.2 g/dL (ref 6.5–8.1)

## 2020-09-22 LAB — URINALYSIS, ROUTINE W REFLEX MICROSCOPIC
Bacteria, UA: NONE SEEN
Bacteria, UA: NONE SEEN
Bilirubin Urine: NEGATIVE
Bilirubin Urine: NEGATIVE
Glucose, UA: NEGATIVE mg/dL
Glucose, UA: NEGATIVE mg/dL
Ketones, ur: 20 mg/dL — AB
Ketones, ur: 20 mg/dL — AB
Leukocytes,Ua: NEGATIVE
Leukocytes,Ua: NEGATIVE
Nitrite: NEGATIVE
Nitrite: NEGATIVE
Protein, ur: 100 mg/dL — AB
Protein, ur: 100 mg/dL — AB
Specific Gravity, Urine: >1.046 — ABNORMAL HIGH (ref 1.005–1.030)
Specific Gravity, Urine: >1.046 — ABNORMAL HIGH (ref 1.005–1.030)
pH: 5 (ref 5.0–8.0)
pH: 5 (ref 5.0–8.0)

## 2020-09-22 LAB — CBC
HCT: 43.5 % (ref 39.0–52.0)
Hemoglobin: 15.5 g/dL (ref 13.0–17.0)
MCH: 30.9 pg (ref 26.0–34.0)
MCHC: 35.6 g/dL (ref 30.0–36.0)
MCV: 86.8 fL (ref 80.0–100.0)
Platelets: 216 10*3/uL (ref 150–400)
RBC: 5.01 MIL/uL (ref 4.22–5.81)
RDW: 12.9 % (ref 11.5–15.5)
WBC: 9.9 10*3/uL (ref 4.0–10.5)
nRBC: 0 % (ref 0.0–0.2)

## 2020-09-22 LAB — I-STAT CHEM 8, ED
BUN: 13 mg/dL (ref 8–23)
Calcium, Ion: 0.94 mmol/L — ABNORMAL LOW (ref 1.15–1.40)
Chloride: 85 mmol/L — ABNORMAL LOW (ref 98–111)
Creatinine, Ser: 0.9 mg/dL (ref 0.61–1.24)
Glucose, Bld: 127 mg/dL — ABNORMAL HIGH (ref 70–99)
HCT: 46 % (ref 39.0–52.0)
Hemoglobin: 15.6 g/dL (ref 13.0–17.0)
Potassium: 3.7 mmol/L (ref 3.5–5.1)
Sodium: 118 mmol/L — CL (ref 135–145)
TCO2: 26 mmol/L (ref 22–32)

## 2020-09-22 LAB — SODIUM: Sodium: 122 mmol/L — ABNORMAL LOW (ref 135–145)

## 2020-09-22 LAB — BASIC METABOLIC PANEL
Anion gap: 9 (ref 5–15)
BUN: 10 mg/dL (ref 8–23)
CO2: 22 mmol/L (ref 22–32)
Calcium: 7.7 mg/dL — ABNORMAL LOW (ref 8.9–10.3)
Chloride: 87 mmol/L — ABNORMAL LOW (ref 98–111)
Creatinine, Ser: 0.93 mg/dL (ref 0.61–1.24)
GFR, Estimated: >60 mL/min (ref >60)
Glucose, Bld: 122 mg/dL — ABNORMAL HIGH (ref 70–99)
Potassium: 3.3 mmol/L — ABNORMAL LOW (ref 3.5–5.1)
Sodium: 118 mmol/L — CL (ref 135–145)

## 2020-09-22 LAB — PROTIME-INR
INR: 1.3 — ABNORMAL HIGH (ref 0.8–1.2)
Prothrombin Time: 16.3 seconds — ABNORMAL HIGH (ref 11.4–15.2)

## 2020-09-22 LAB — SALICYLATE LEVEL: Salicylate Lvl: <7 mg/dL — ABNORMAL LOW (ref 7.0–30.0)

## 2020-09-22 LAB — MAGNESIUM: Magnesium: 1.4 mg/dL — ABNORMAL LOW (ref 1.7–2.4)

## 2020-09-22 LAB — CBG MONITORING, ED: Glucose-Capillary: 145 mg/dL — ABNORMAL HIGH (ref 70–99)

## 2020-09-22 LAB — ETHANOL: Alcohol, Ethyl (B): <10 mg/dL (ref <10)

## 2020-09-22 LAB — SODIUM, URINE, RANDOM: Sodium, Ur: 15 mmol/L

## 2020-09-22 LAB — LACTIC ACID, PLASMA
Lactic Acid, Venous: 1.7 mmol/L (ref 0.5–1.9)
Lactic Acid, Venous: 1.8 mmol/L (ref 0.5–1.9)

## 2020-09-22 LAB — OSMOLALITY, URINE: Osmolality, Ur: 716 mOsm/kg (ref 300–900)

## 2020-09-22 LAB — APTT: aPTT: 34 seconds (ref 24–36)

## 2020-09-22 IMAGING — DX DG CHEST 1V PORT
1 series · 1 of 1 positions shown · non-contrast
Comparison: [DATE]

CLINICAL DATA: Questionable sepsis

EXAM:
PORTABLE CHEST 1 VIEW

[chest]
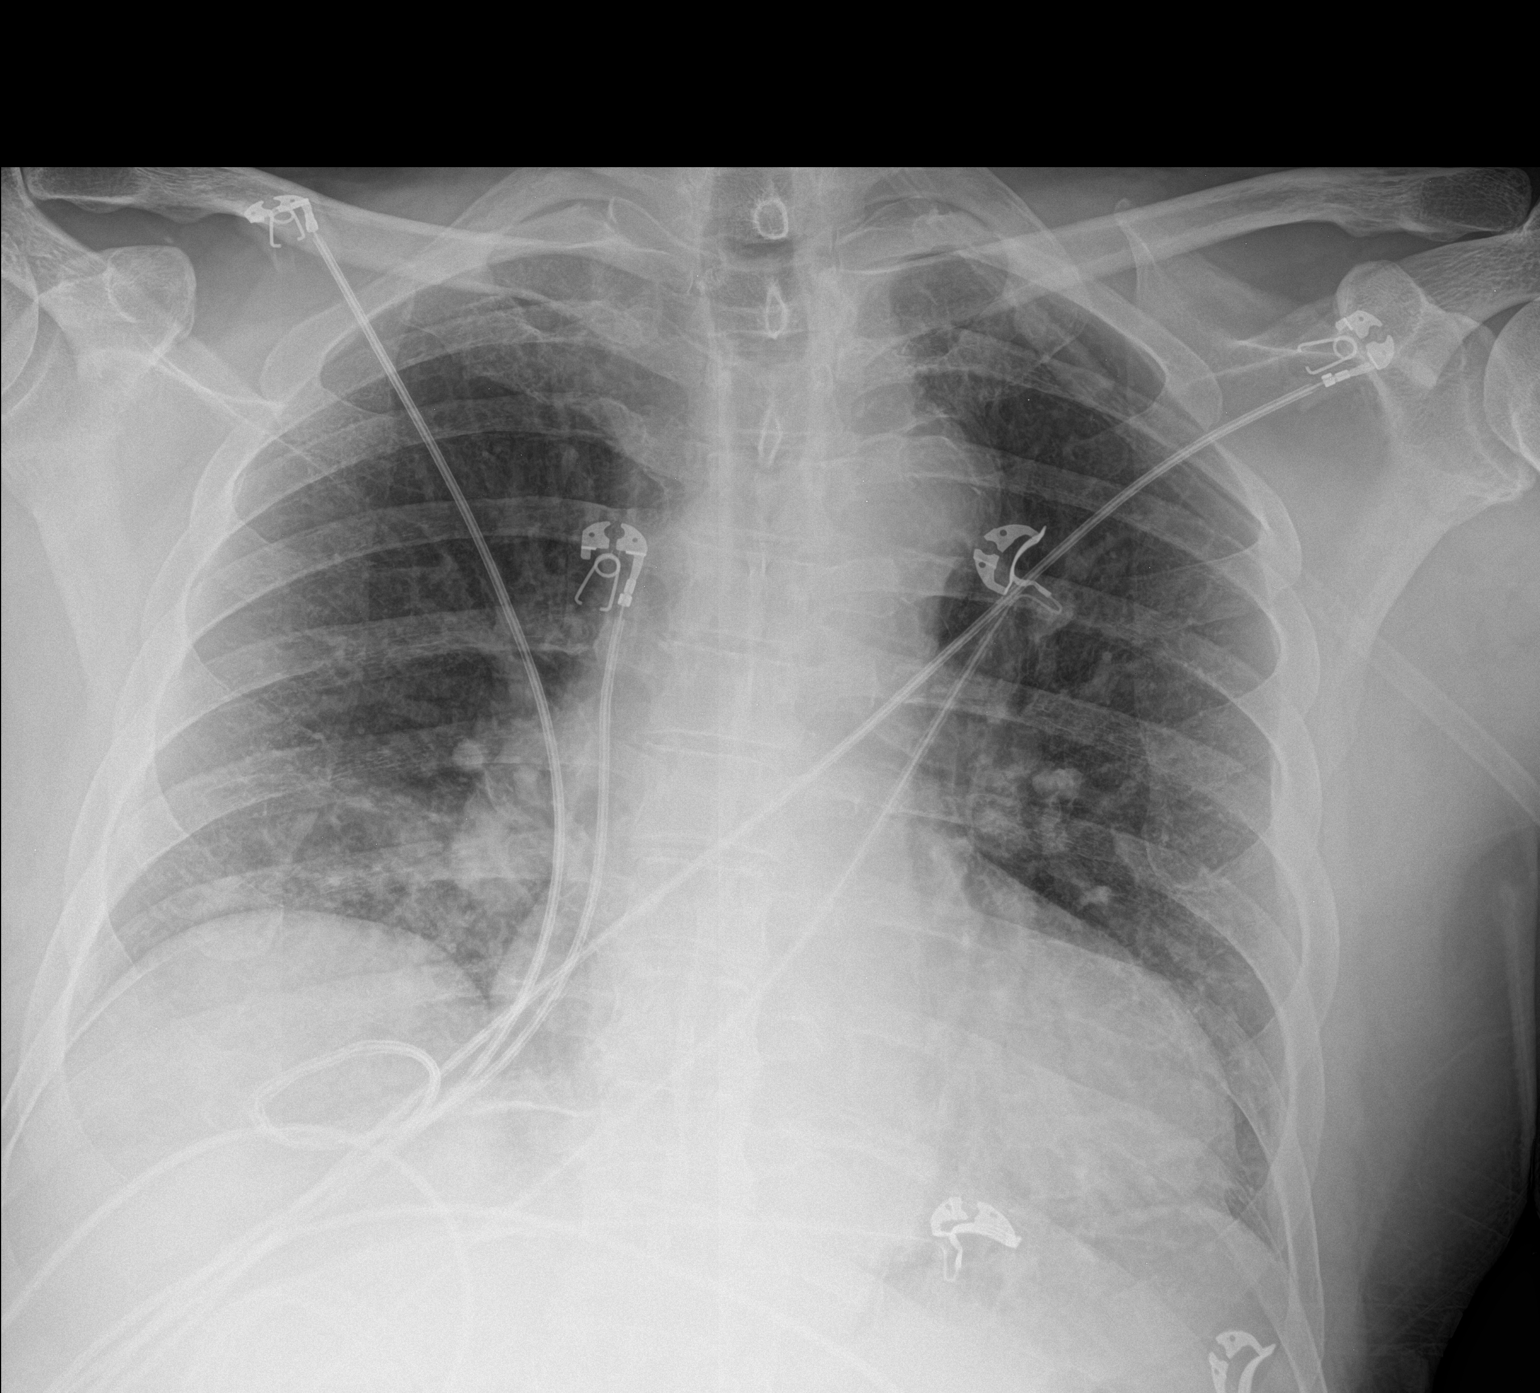

[1 of 1 positions shown; findings below may reference images not displayed]

FINDINGS: Cardiomegaly. Mild vascular congestion. Bibasilar airspace
opacities, bibasilar airspace opacities. No effusions or overt
edema. No acute bony abnormality.
IMPRESSION: Cardiomegaly, vascular congestion.

Bibasilar atelectasis or infiltrates.

## 2020-09-22 IMAGING — CT CT HEAD CODE STROKE
4 series · 14 of 47 positions shown, 16 images · non-contrast
Comparison: Brain MRI [DATE]. Noncontrast head CT [DATE].
CT angiogram head/neck [DATE].

CLINICAL DATA: Code stroke. Neuro deficit, acute, stroke suspected.
Additional history provided: Last known well [9H], aphasia, LVO
score 4.

EXAM:
CT HEAD WITHOUT CONTRAST
TECHNIQUE: Contiguous axial images were obtained from the base of the skull
through the vertex without intravenous contrast.

[Series 3: head wo · axial · 0.36mm/px · z∈[+990,+1111]mm · 6 of 35 slices shown, 8 images]
[im 5/35  brain]
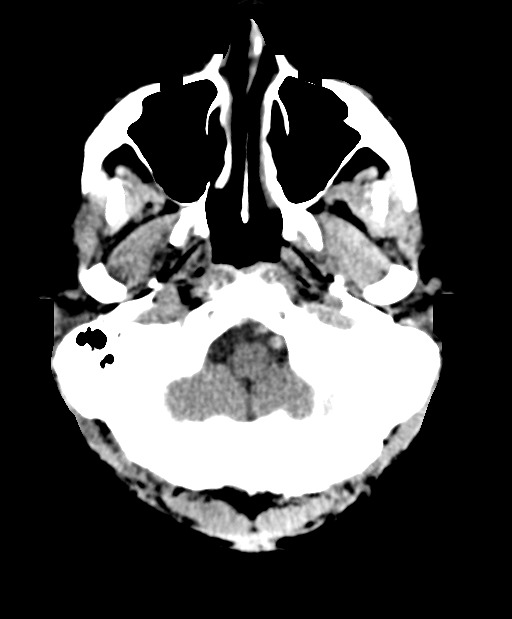
[im 5/35  bone]
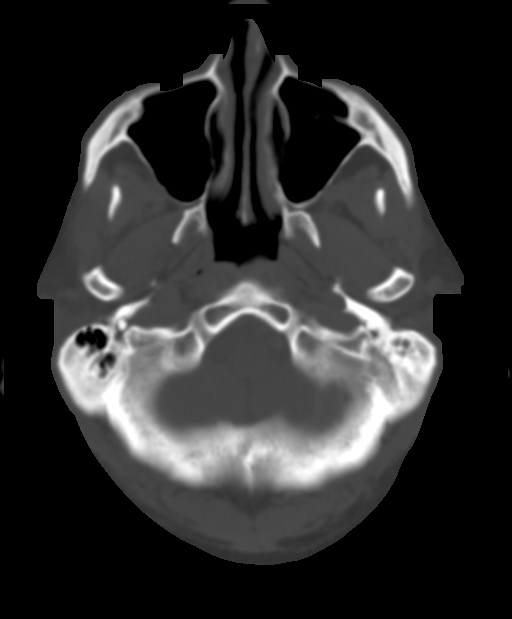
[im 10/35  brain]
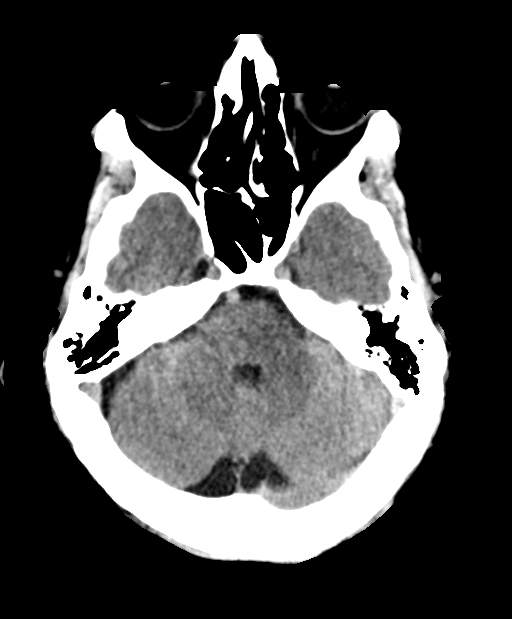
[im 15/35  brain]
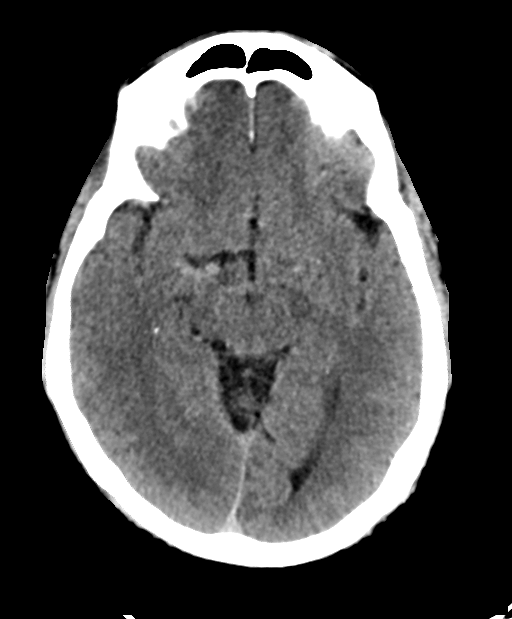
[im 20/35  brain]
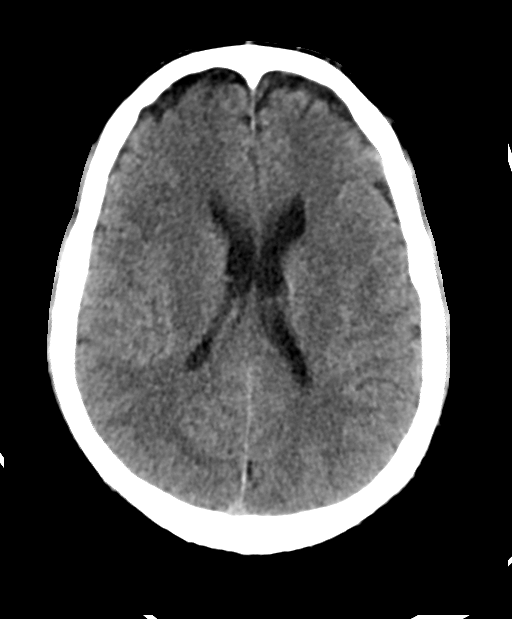
[im 25/35  brain]
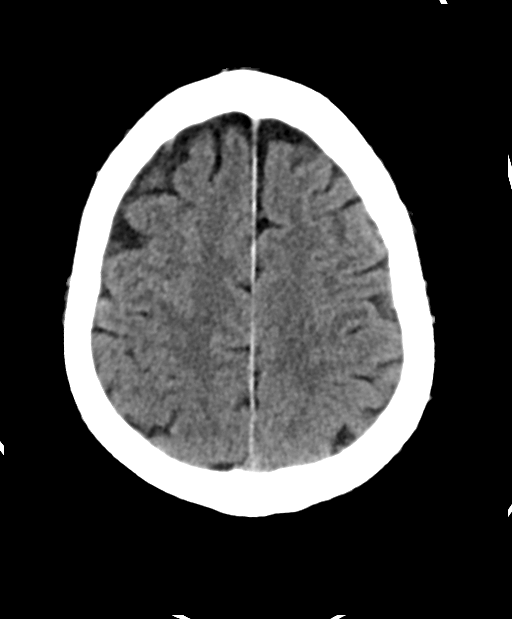
[im 25/35  bone]
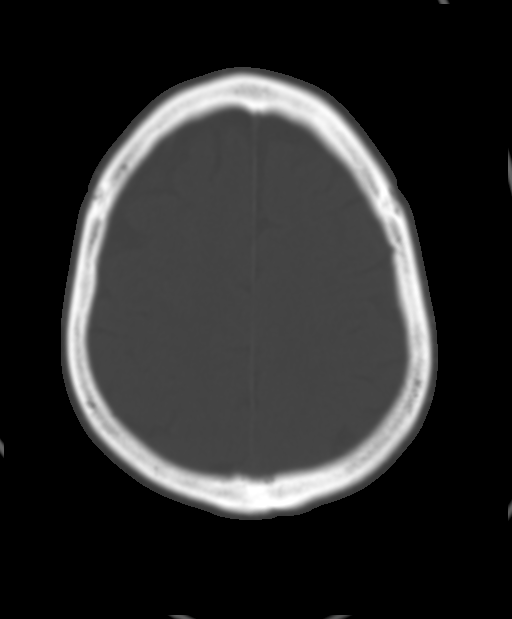
[im 30/35  brain]
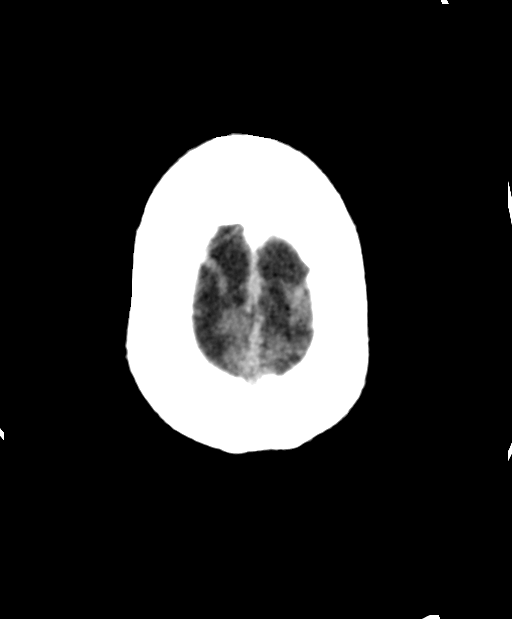

[Series 4: cor soft · coronal · 0.35mm/px · 3 of 73 slices shown]
[im 25/73  brain]
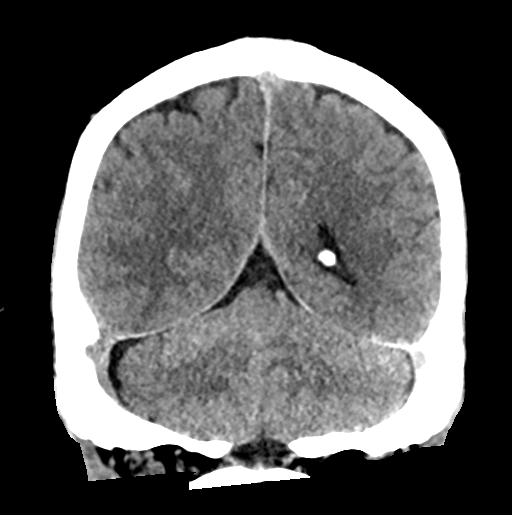
[im 33/73  brain]
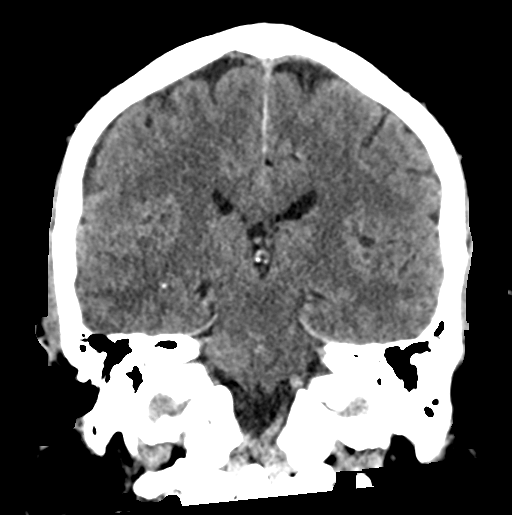
[im 41/73  brain]
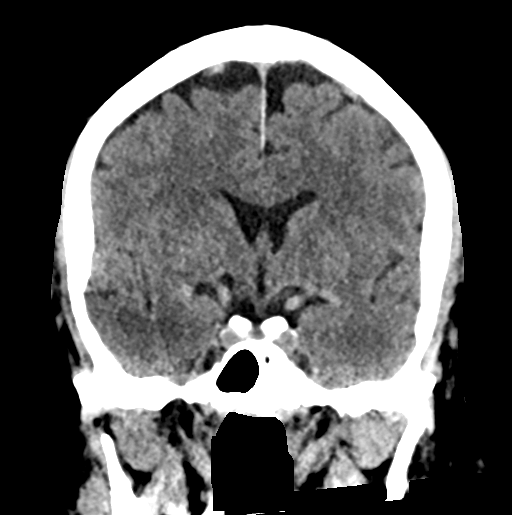

[Series 5: sag soft · sagittal · 0.34mm/px · 3 of 60 slices shown]
[im 20/60  brain]
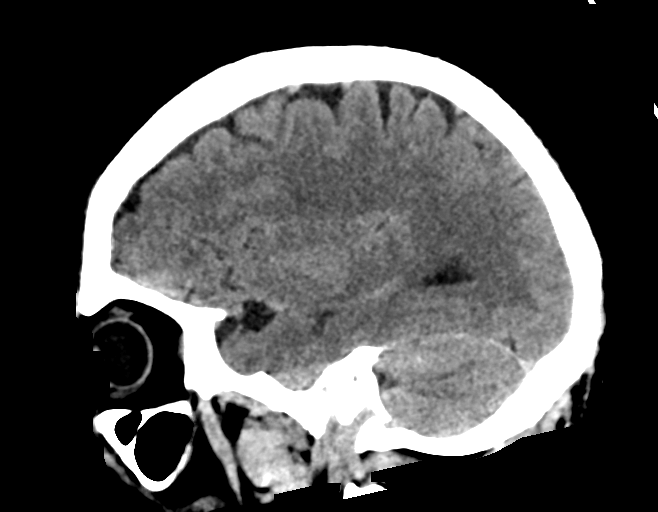
[im 30/60  brain]
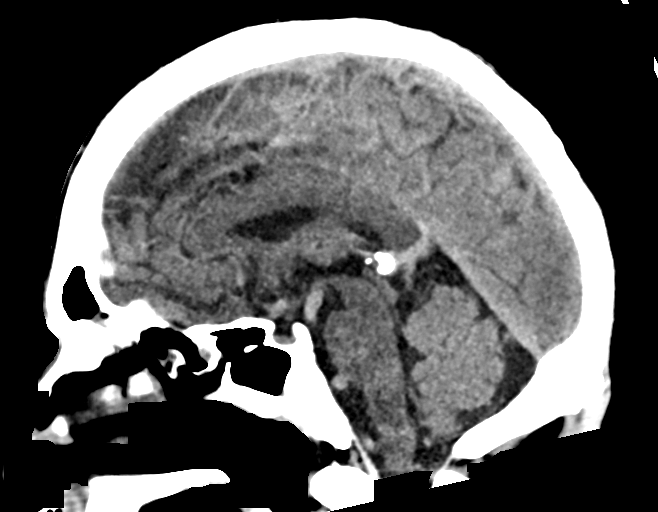
[im 40/60  brain]
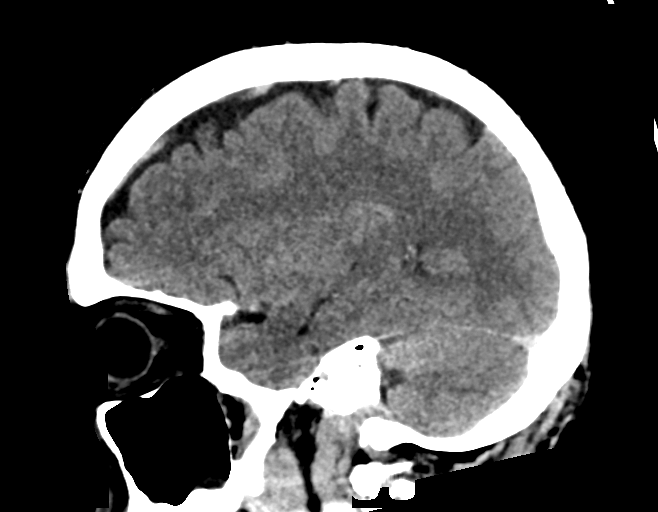

[Series 6: head bone · axial · 0.36mm/px · z∈[+961,+979]mm · 2 of 96 slices shown]
[im 10/96  bone]
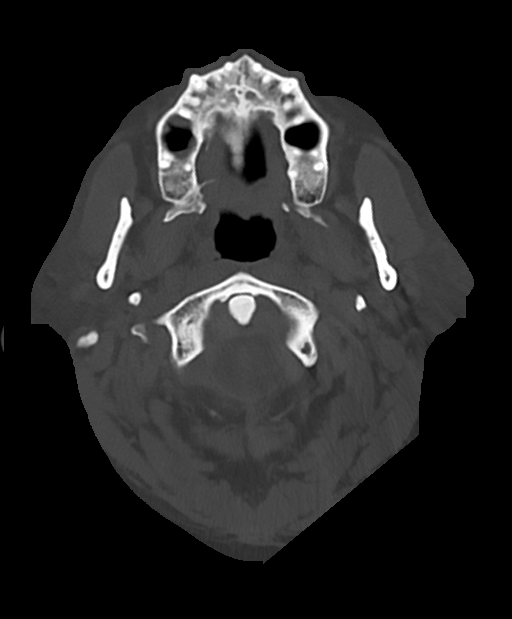
[im 19/96  bone]
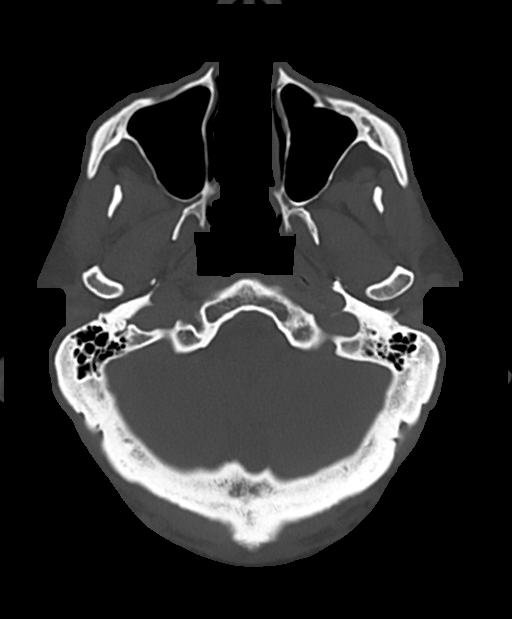

[14 of 47 positions shown; findings below may reference images not displayed]

FINDINGS: Brain:

Cerebral volume is normal.

There is no acute intracranial hemorrhage.

No demarcated cortical infarct.

No extra-axial fluid collection.

No evidence of an intracranial mass.

No midline shift.

Vascular: No hyperdense vessel.

Skull: Normal. Negative for fracture or focal lesion.

Sinuses/Orbits: Visualized orbits show no acute finding.
Postsurgical appearance of the paranasal sinuses. Mild scattered
paranasal sinus mucosal thickening at the imaged levels. Small
bilateral maxillary sinus mucous retention cysts.

ASPECTS (Alberta Stroke Program Early CT Score)

- Ganglionic level infarction (caudate, lentiform nuclei, internal
capsule, insula, M1-M3 cortex): 7

- Supraganglionic infarction (M4-M6 cortex): 3

Total score (0-10 with 10 being normal): 10

These results were communicated to Dr. LANGHAM at [DATE] pmon
[DATE]by text page via the [employer] messaging system.
IMPRESSION: No evidence of acute intracranial abnormality.

Paranasal sinus disease, as described.

## 2020-09-22 IMAGING — CT CT HEAD W/O CM
3 of 4 series · 14 of 47 positions shown, 16 images · non-contrast
Comparison: CT from earlier the same day.

CLINICAL DATA: Follow-up examination for acute stroke, waxing and
waning dysarthria, right-sided weakness.

EXAM:
CT HEAD WITHOUT CONTRAST
TECHNIQUE: Contiguous axial images were obtained from the base of the skull
through the vertex without intravenous contrast.

[Series 4: head 5.0 (person_name)30(person_name) (person_name · axial · 0.46mm/px · z∈[-88,+62]mm · 8 of 36 slices shown, 10 images]
[im 3/36  brain]
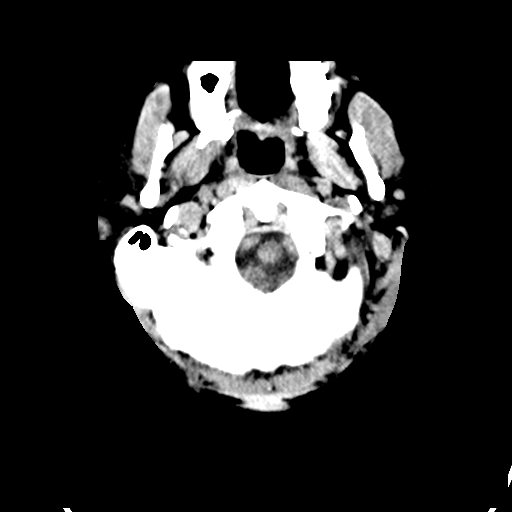
[im 3/36  bone]
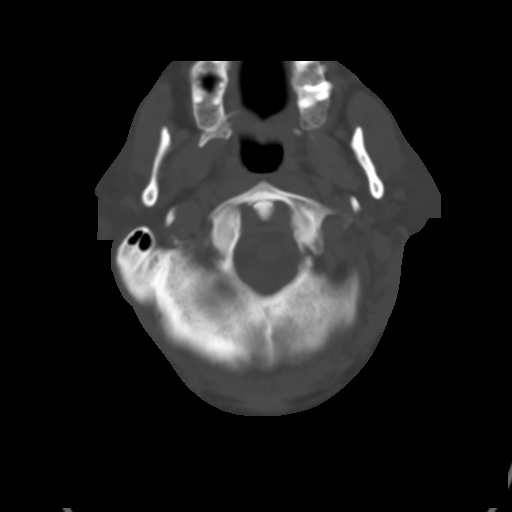
[im 8/36  brain]
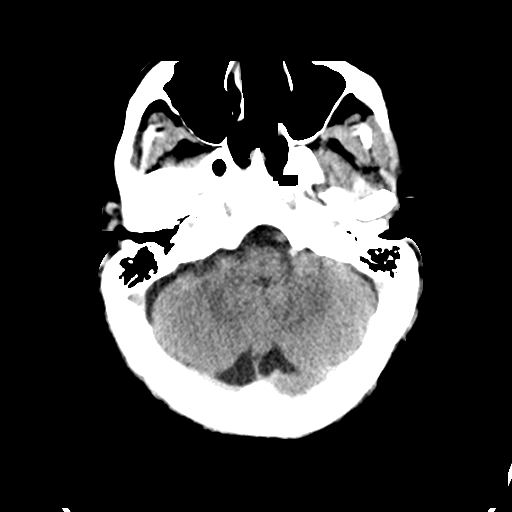
[im 13/36  brain]
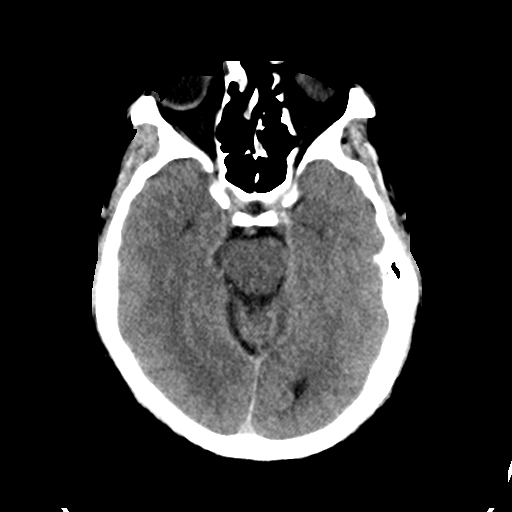
[im 16/36  brain]
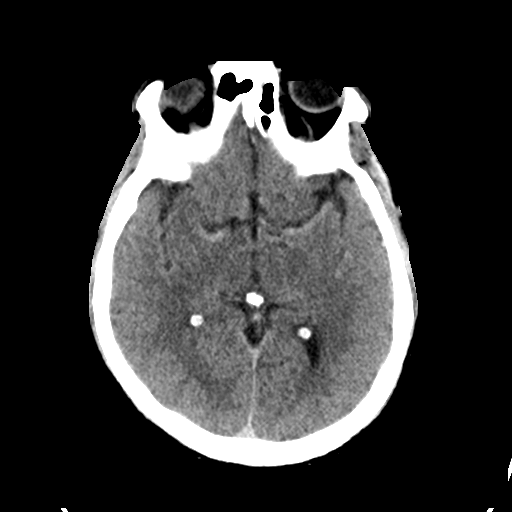
[im 21/36  brain]
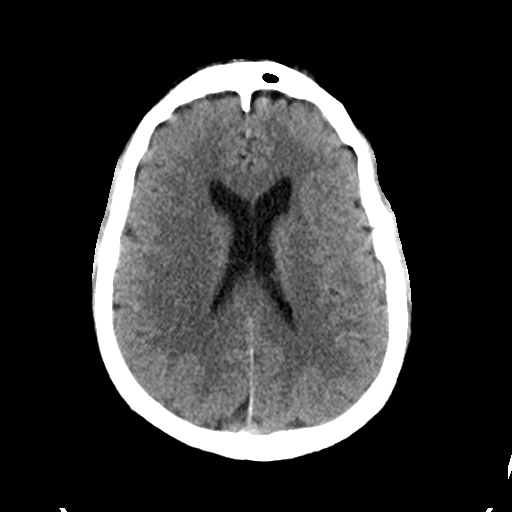
[im 21/36  bone]
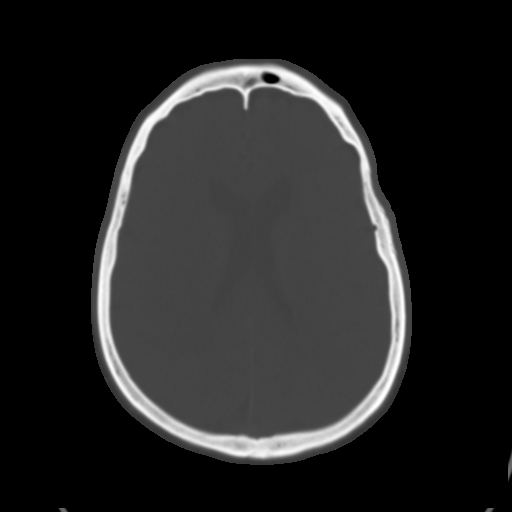
[im 23/36  brain]
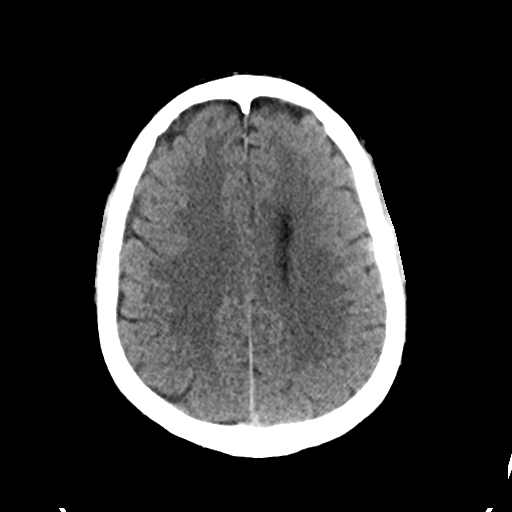
[im 28/36  brain]
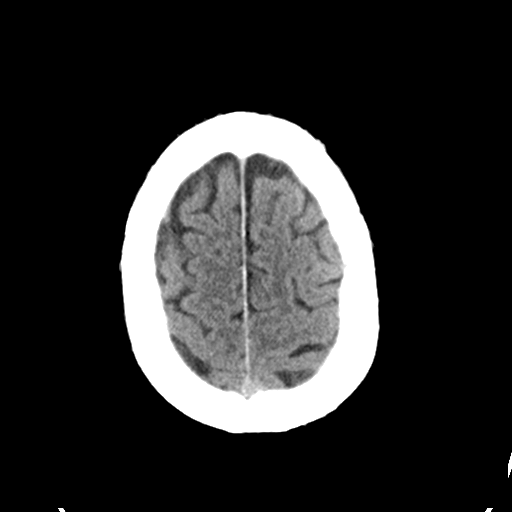
[im 33/36  brain]
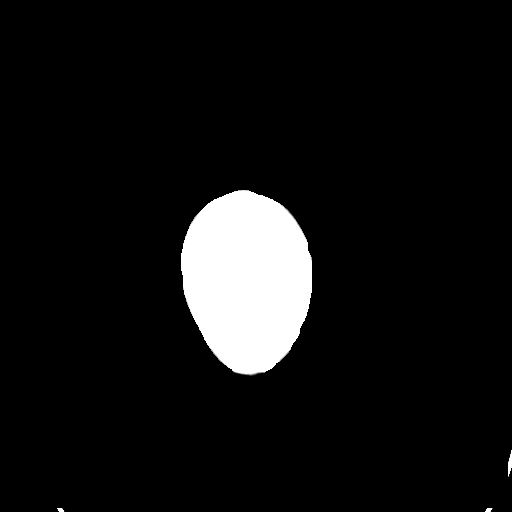

[Series 5: head 3.0 mpr sag · sagittal · 0.34mm/px · 3 of 58 slices shown]
[im 20/58  brain]
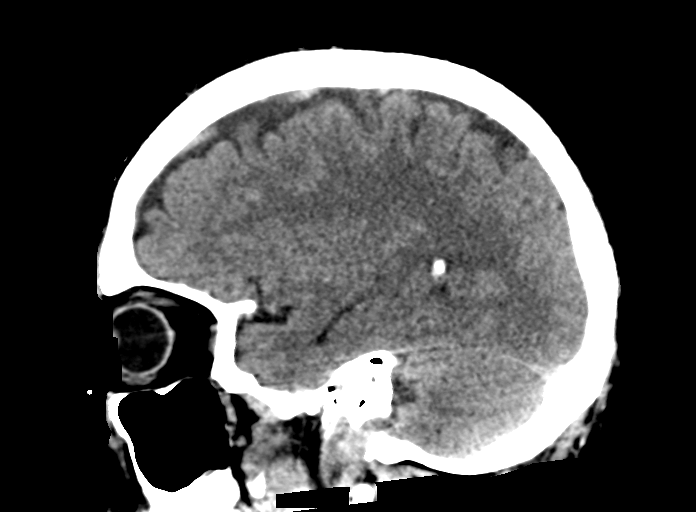
[im 29/58  brain]
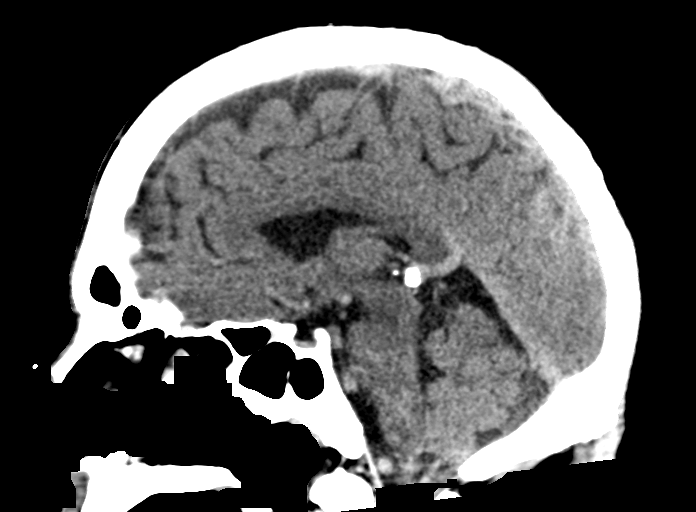
[im 39/58  brain]
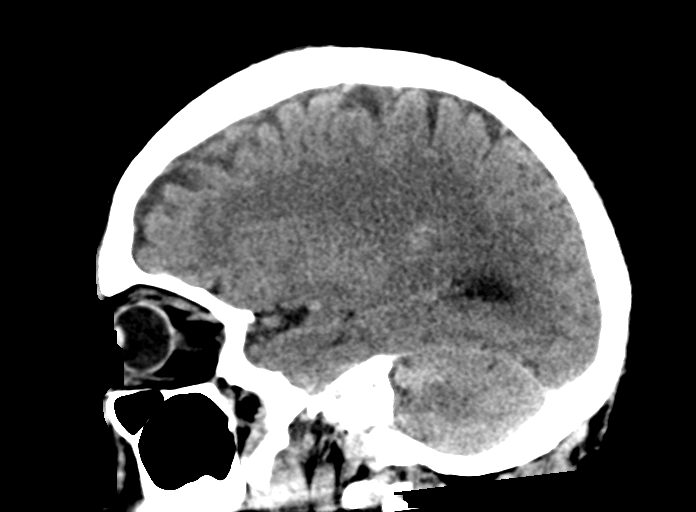

[Series 6: head 3.0 mpr cor · coronal · 0.34mm/px · 3 of 70 slices shown]
[im 24/70  brain]
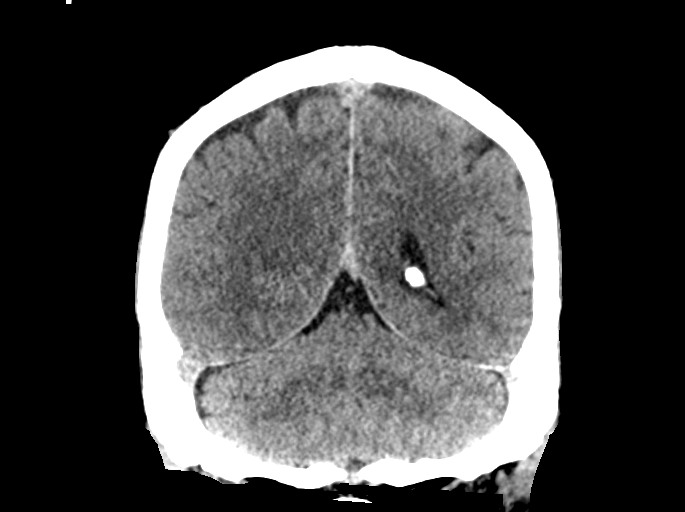
[im 31/70  brain]
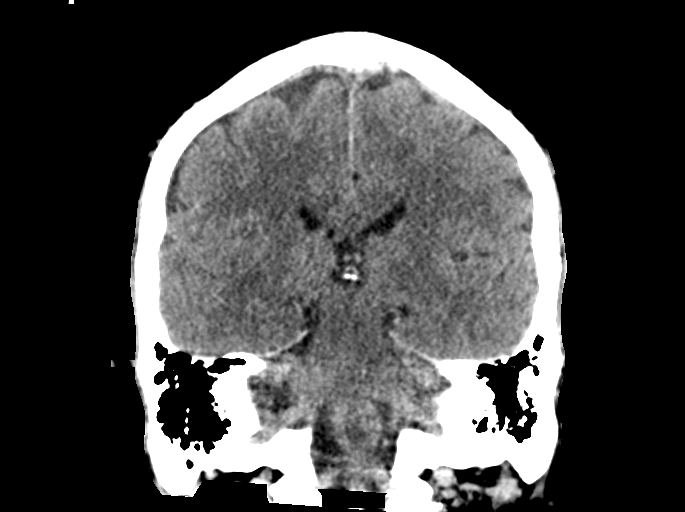
[im 39/70  brain]
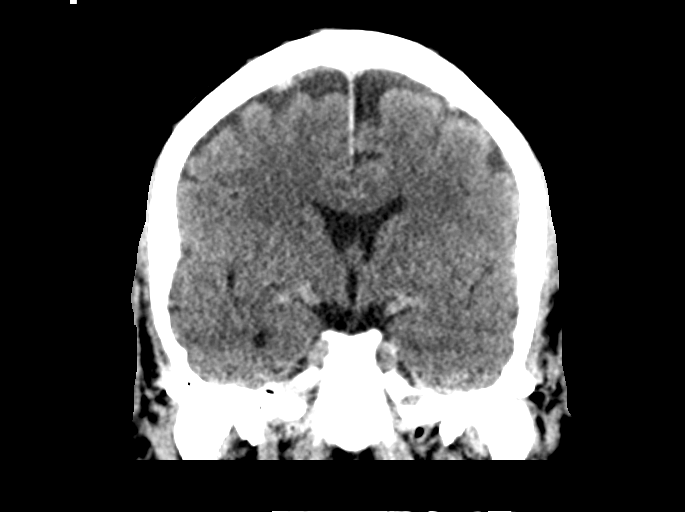

[14 of 47 positions shown; findings below may reference images not displayed]

FINDINGS: Brain: Cerebral volume within normal limits. No acute intracranial
hemorrhage. No visible acute large vessel territory infarct. No mass
lesion, midline shift or mass effect. No hydrocephalus or
extra-axial fluid collection.

Vascular: No asymmetric hyperdense vessel.

Skull: Scalp soft tissues and calvarium within normal limits.

Sinuses/Orbits: Globes and orbital soft tissues demonstrate no acute
finding. Mild scattered mucosal thickening noted throughout the
paranasal sinuses. Mastoid air cells remain clear.

Other: None.
IMPRESSION: Stable head CT.  No acute intracranial abnormality identified.

## 2020-09-22 MED ORDER — SODIUM CHLORIDE 0.9 % IV SOLN
2.0000 g | Freq: Three times a day (TID) | INTRAVENOUS | Status: DC
  Administered 2020-09-22 – 2020-09-25 (×8): 2 g via INTRAVENOUS
  Filled 2020-09-22 (×8): qty 2

## 2020-09-22 MED ORDER — SODIUM CHLORIDE 0.9 % IV SOLN
1.0000 g | Freq: Once | INTRAVENOUS | Status: AC
  Administered 2020-09-22: 1 g via INTRAVENOUS
  Filled 2020-09-22: qty 10

## 2020-09-22 MED ORDER — TENECTEPLASE FOR STROKE
0.2500 mg/kg | PACK | Freq: Once | INTRAVENOUS | Status: AC
  Administered 2020-09-22: 24 mg via INTRAVENOUS
  Filled 2020-09-22: qty 4.8

## 2020-09-22 MED ORDER — SENNOSIDES-DOCUSATE SODIUM 8.6-50 MG PO TABS: 1.0000 | ORAL_TABLET | Freq: Every evening | ORAL | Status: DC | PRN

## 2020-09-22 MED ORDER — IBUPROFEN 400 MG PO TABS: 400.0000 mg | ORAL_TABLET | Freq: Four times a day (QID) | ORAL | Status: DC | PRN

## 2020-09-22 MED ORDER — LABETALOL HCL 5 MG/ML IV SOLN: 10.0000 mg | Freq: Once | INTRAVENOUS | Status: DC

## 2020-09-22 MED ORDER — PANTOPRAZOLE SODIUM 40 MG IV SOLR
40.0000 mg | Freq: Every day | INTRAVENOUS | Status: DC
  Administered 2020-09-23 – 2020-09-24 (×3): 40 mg via INTRAVENOUS
  Filled 2020-09-22 (×4): qty 40

## 2020-09-22 MED ORDER — CHLORHEXIDINE GLUCONATE CLOTH 2 % EX PADS
6.0000 | MEDICATED_PAD | Freq: Every day | CUTANEOUS | Status: DC
  Administered 2020-09-23 – 2020-09-27 (×5): 6 via TOPICAL

## 2020-09-22 MED ORDER — SODIUM CHLORIDE 0.9% FLUSH
3.0000 mL | Freq: Once | INTRAVENOUS | Status: AC
  Administered 2020-09-22: 3 mL via INTRAVENOUS

## 2020-09-22 MED ORDER — VANCOMYCIN HCL 1250 MG/250ML IV SOLN
1250.0000 mg | Freq: Two times a day (BID) | INTRAVENOUS | Status: DC
  Administered 2020-09-23 – 2020-09-24 (×2): 1250 mg via INTRAVENOUS
  Filled 2020-09-22 (×3): qty 250

## 2020-09-22 MED ORDER — SODIUM CHLORIDE 0.9 % IV SOLN: INTRAVENOUS | Status: DC

## 2020-09-22 MED ORDER — STROKE: EARLY STAGES OF RECOVERY BOOK
Freq: Once | Status: AC
  Filled 2020-09-22 (×3): qty 1

## 2020-09-22 MED ORDER — LACTATED RINGERS IV SOLN: INTRAVENOUS | Status: AC

## 2020-09-22 MED ORDER — ACETAMINOPHEN 500 MG PO TABS
500.0000 mg | ORAL_TABLET | Freq: Four times a day (QID) | ORAL | Status: DC | PRN
  Administered 2020-09-24: 500 mg via ORAL
  Filled 2020-09-22: qty 1

## 2020-09-22 MED ORDER — POTASSIUM CHLORIDE 10 MEQ/100ML IV SOLN
10.0000 meq | INTRAVENOUS | Status: AC
Start: 2020-09-22 — End: 2020-09-23
  Administered 2020-09-23 (×4): 10 meq via INTRAVENOUS
  Filled 2020-09-22 (×4): qty 100

## 2020-09-22 MED ORDER — LACTATED RINGERS IV BOLUS (SEPSIS)
1000.0000 mL | Freq: Once | INTRAVENOUS | Status: AC
  Administered 2020-09-22: 1000 mL via INTRAVENOUS

## 2020-09-22 MED ORDER — ACETAMINOPHEN 650 MG RE SUPP
650.0000 mg | RECTAL | Status: DC | PRN
  Administered 2020-09-23 (×3): 650 mg via RECTAL
  Filled 2020-09-22 (×3): qty 1

## 2020-09-22 MED ORDER — SODIUM CHLORIDE 3 % IV SOLN
INTRAVENOUS | Status: DC
  Filled 2020-09-22 (×2): qty 500

## 2020-09-22 MED ORDER — IOHEXOL 350 MG/ML SOLN
100.0000 mL | Freq: Once | INTRAVENOUS | Status: AC | PRN
  Administered 2020-09-22: 100 mL via INTRAVENOUS

## 2020-09-22 MED ORDER — VANCOMYCIN HCL 2000 MG/400ML IV SOLN
2000.0000 mg | Freq: Once | INTRAVENOUS | Status: AC
  Administered 2020-09-22: 2000 mg via INTRAVENOUS
  Filled 2020-09-22: qty 400

## 2020-09-22 MED ORDER — CLEVIDIPINE BUTYRATE 0.5 MG/ML IV EMUL: 0.0000 mg/h | INTRAVENOUS | Status: DC

## 2020-09-22 NOTE — Consult Note (Addendum)
NAME:  Alexander [facility], MRN:  [id number], DOB:  1950-04-02, LOS: 0 ADMISSION DATE:  09/22/2020, CONSULTATION DATE:  09/22/20 REFERRING MD:  Wilford Corner, CHIEF COMPLAINT:  confusion   History of Present Illness:  Alexander Pierce is a 70 y.o. M with PMH of allergic rhinitis, HL not on medication and osteoarthritis who presented to the ED with fever, confusion and slurred speech.  There was concern for R-sided facial droop and weakness and code stroke was called and patient received TPA.   CT and CTA head did not reveal acute stroke, hemorrhage or LVO.    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   He gave a speech on 9/3 and later that day told his wife that he did not feel well, he mostly stayed in bed and ate very little.  He developed a fever to 101F without coughing, URI sx's, urinary symptoms, abdominal pain or significant diarrhea.   On 9/5 his wife noted confusion and difficulty speaking so was brought to the ED.  No known recent infectious exposures, travelled to the [place] approximately one month ago, had a tick bite per prior notes several months ago, RMSF negative, but none recently and no rashes.    Work-up in the ED was significant for Na of 117, no leukocytosis or lactic acidosis, CXR with atelectasis and possible basal infiltrates, normal renal function, UA with ketones and high specific gravity.   Wife denies any ETOH use, no diuretics at home.  Prior labs show mild hyponatremia 130-132.   Pertinent  Medical History   has a past medical history of ALLERGIC RHINITIS (06/29/2007), Allergy, HYPERLIPIDEMIA (06/29/2007), and Right inguinal hernia (06/24/2011).   Significant Hospital Events: Including procedures, antibiotic start and stop dates in addition to other pertinent events   9/5 Presented to ED as code stroke, received TPA, concern for infection  Interim History /  Subjective:  As above  Objective   Blood pressure (!) 166/86, pulse (!) 101, temperature (!) 103 F (39.4 C), temperature source Oral, resp. rate (!) 29, weight 96.2 kg, SpO2 95 %.        Intake/Output Summary (Last 24 hours) at 09/22/2020 2056 Last data filed at 09/22/2020 2007 Gross per 24 hour  Intake 100 ml  Output --  Net 100 ml   Filed Weights   09/22/20 1800  Weight: 96.2 kg   General:  well-nourished M, awake and sitting up in bed in no distress HEENT: MM pink/dry, sclera anicteric  Neuro: alert, slow to answer questions but no current slurred speech, no facial droop, slightly decreased R grip strength, oriented to person and place, could not give the date, lifting all extremities equally CV: s1s2 rrr, no m/r/g PULM:  clear bilaterally without wheezing or rhonchi, on room air without retractions or accessory muscle yse GI: soft, bsx4 active  Extremities: warm/dry, no edema  Skin: no rashes or lesions, +tenting   Resolved Hospital Problem list     Assessment & Plan:    Acute Altered Mental Status, waxing and waning dysarthria and and R-sided weakness, concern for acute CVA Initially a code stroke and Received TPA  Initial CT and CTA head without acute findings Hypertensive  P: -Management of possible CVA per neurology -Serial Neuro checks and close monitoring in neuro ICU -Check Echo  -MRI brain -repeat CT for acutely changing neuro exam    Acute  Hyponatremia Suspect is the cause of patient's acute confusion Initial 117 Urine Na 15, urine osms 716, serum osms pending UA with high specific gravity and ketones P: -possible hypovolemic hyponatremia in the setting of poor po intake for two days, received 1L LR in the ED -repeat BMP with Na of 118, start hypertonic saline at 50cc/hr, goal increase 6-8  -meq/24hrs -q2hr sodium checks -continue LR  -check TSH and Cortisol levels   Fever 101F at home and 103F here No lactic acidosis, leukocytosis or  hypotension CXR with possible atelectasis or infiltrates in bilateral bases Unclear etiology, wife denies associated symptoms other than fatigue, though EMS run sheet mentions coughing on arrival P: -check Covid-19, Flu, blood, urine and sputum cultures -empiric broad spectrum antibiotics for now, likely de-escalate as culture results become available -no meningeal signs on exam        Best Practice (right click and "Reselect all SmartList Selections" daily)   Diet/type: Regular consistency (see orders) DVT prophylaxis: not indicated (TPA) GI prophylaxis: N/A Lines: N/A Foley:  Yes, and it is still needed Code Status:  full code Last date of multidisciplinary goals of care discussion [Pt's wife updated at the bedside]  Labs   CBC: Recent Labs  Lab 09/22/20 1800 09/22/20 1809  WBC 9.9  --   NEUTROABS 8.6*  --   HGB 15.5 15.6  HCT 43.5 46.0  MCV 86.8  --   PLT 216  --     Basic Metabolic Panel: Recent Labs  Lab 09/22/20 1800 09/22/20 1809  NA 117* 118*  K 4.1 3.7  CL 86* 85*  CO2 20*  --   GLUCOSE 124* 127*  BUN 11 13  CREATININE 0.97 0.90  CALCIUM 8.2*  --    GFR: Estimated Creatinine Clearance: 87.5 mL/min (by C-G formula based on SCr of 0.9 mg/dL). Recent Labs  Lab 09/22/20 1800 09/22/20 1856  WBC 9.9  --   LATICACIDVEN  --  1.7    Liver Function Tests: Recent Labs  Lab 09/22/20 1800  AST 34  ALT 24  ALKPHOS 64  BILITOT 1.0  PROT 7.2  ALBUMIN 3.2*   No results for input(s): LIPASE, AMYLASE in the last 168 hours. No results for input(s): AMMONIA in the last 168 hours.  ABG    Component Value Date/Time   TCO2 26 09/22/2020 1809     Coagulation Profile: Recent Labs  Lab 09/22/20 1800  INR 1.3*    Cardiac Enzymes: No results for input(s): CKTOTAL, CKMB, CKMBINDEX, TROPONINI in the last 168 hours.  HbA1C: Hgb A1c MFr Bld  Date/Time Value Ref Range Status  04/24/2020 09:12 AM 6.1 4.6 - 6.5 % Final    Comment:    Glycemic  Control Guidelines for People with Diabetes:Non Diabetic:  <6%Goal of Therapy: <7%Additional Action Suggested:  >8%     CBG: Recent Labs  Lab 09/22/20 1756  GLUCAP 145*    Review of Systems:   Unable to obtain secondary to acute confusion  Past Medical History:  He,  has a past medical history of ALLERGIC RHINITIS (06/29/2007), Allergy, HYPERLIPIDEMIA (06/29/2007), and Right inguinal hernia (06/24/2011).   Surgical History:   Past Surgical History:  Procedure Laterality Date   INGUINAL HERNIA REPAIR     sinus surgury  1992   dr Haroldine Laws     Social History:   reports that he has never smoked. He has never used smokeless tobacco. He reports that he does not drink alcohol and does not use drugs.  Family History:  His family history includes Hypertension in an other family member; Parkinsonism in an other family member; Prostate cancer in an other family member. There is no history of Colon cancer, Rectal cancer, or Stomach cancer.   Allergies Allergies  Allergen Reactions   Acetaminophen     REACTION: eyes swell   Aspirin     REACTION: eyes swell   Ibuprofen     REACTION: eyes swell   Penicillins     REACTION: pt cant remember     Home Medications  Prior to Admission medications   Medication Sig Start Date End Date Taking? Authorizing Provider  Cetirizine HCl 10 MG CAPS Take 1 capsule (10 mg total) by mouth daily for 10 days. 04/28/20 05/08/20  Wieters, Junius Creamer, PA-C     Critical care time:   45 minutes     CRITICAL CARE Performed by: Darcella Gasman Ajdin Macke   Total critical care time: 45 minutes  Critical care time was exclusive of separately billable procedures and treating other patients.  Critical care was necessary to treat or prevent imminent or life-threatening deterioration.  Critical care was time spent personally by me on the following activities: development of treatment plan with patient and/or surrogate as well as nursing, discussions with consultants,  evaluation of patient's response to treatment, examination of patient, obtaining history from patient or surrogate, ordering and performing treatments and interventions, ordering and review of laboratory studies, ordering and review of radiographic studies, pulse oximetry and re-evaluation of patient's condition.  Darcella Gasman Jesika Men, PA-C [facility] See Amion for pager If no response to pager , please call [phone] until 7pm After 7:00 pm call Elink  [phone]

## 2020-09-22 NOTE — Plan of Care (Signed)
Pt sign out received. Sick pt - not a candidate for low intensity OPTIMIST monitoring.  -- Milon Dikes, MD Neurologist [facility] Pager: [phone]

## 2020-09-22 NOTE — Code Documentation (Signed)
Patient from home with wife. Wife states he has been "feverish and fatigued" recently but otherwise his independent self. At 1430 she noted an onset of difficulty with his words and confusion. Family called 911. [facility] arrived and noted aphasia and some slurred speech. BP 208/120, CBG 110. Pt taken to [facility]. Patient was met by the stroke team and cleared for CT by EDP Dr. Tomi Bamberger. Pt taken for CT/CTA/CTP. NIHSS 5 (see documentation for details). Scan results below. Pt negative for hemorrhage per MD. Pt not on any blood thinners and no other exclusion criteria for TNK. Two MD decision to give TNK. Given at 1825. BP 164/80. Pt with a temp of 103 orally. No evidence of LVO per MD. Care Plan: q15x2, q30x6, q1x16, stroke workup, BP<180/105, blood cultures. Hand off with Barnetta Chapel, RN.   "CTA neck:   Streak and beam hardening artifact arising from a dense left-sided contrast bolus partially obscures the proximal left vertebral artery. Within this limitation, the common carotid, internal carotid and vertebral arteries are patent within the neck without stenosis. Mild atherosclerotic plaque within the bilateral carotid systems within the neck, as described.   CTA head:   No intracranial large vessel occlusion or proximal high-grade arterial stenosis identified.   CT perfusion head:   Failed CT perfusion, likely due to patient motion.  Icelyn Navarrete, Rande Brunt, RN  Stroke Response Nurse

## 2020-09-22 NOTE — H&P (Addendum)
Admission H&P    Chief Complaint: Acute onset of expressive aphasia  HPI: Alexander WITHERSPOON is an 70 y.o. male with a PMHx of HLD who presents acutely to the ED as a Code Stroke for expressive and receptive dysphasia. EMS was called to his home by family after the sudden onset of what was initially thought to be confusion. On EMS assessment, he had difficulty understanding > 1 step commands and had significant word finding difficulty. Code Stroke was called in the field. Wife had stated to EMS that the patient had been fatigued and feverish recently. His sudden neurological change was noted at 2:30 PM (witnessed time of symptom onset).    LSN: 2:30 PM IV thrombolytic Given: Yes (TNK)   Past Medical History:  Diagnosis Date   ALLERGIC RHINITIS 06/29/2007   Qualifier: Diagnosis of  By: Jonny Ruiz MD, Len Blalock    Allergy    seasonal   HYPERLIPIDEMIA 06/29/2007   Qualifier: Diagnosis of  By: Jonny Ruiz MD, Len Blalock    Right inguinal hernia 06/24/2011    Past Surgical History:  Procedure Laterality Date   INGUINAL HERNIA REPAIR     sinus surgury  1992   dr Haroldine Laws    Family History  Problem Relation Age of Onset   Parkinsonism Other    Prostate cancer Other    Hypertension Other    Colon cancer Neg Hx    Rectal cancer Neg Hx    Stomach cancer Neg Hx    Social History:  reports that he has never smoked. He has never used smokeless tobacco. He reports that he does not drink alcohol and does not use drugs.  Allergies:  Allergies  Allergen Reactions   Acetaminophen     REACTION: eyes swell Per family, patient takes Goody's powders   Aspirin     REACTION: eyes swell   Ibuprofen     REACTION: eyes swell   Penicillins     REACTION: pt cant remember    (Not in a hospital admission)   ROS: Detailed ROS deferred in the context of acuity of presentation and cognitive/communication deficit.    Physical Examination: Blood pressure (!) 164/100, pulse (!) 109, temperature (!) 102.9 F (39.4 C),  temperature source Oral, resp. rate (!) 33, weight 96.2 kg, SpO2 97 %.  HEENT-  [place]/AT  Lungs - Respirations unlabored Extremities - No edema  Neurologic Examination: Mental Status: Awake and alert. Speech nondysarthric but is dysfluent and fragmented with word finding deficit, frequent phonemic paraphasias and frequent pauses. Finger naming is impaired. Unable to name several items from NIHSS card. Unable to follow a 3 step command. Repetition is intact.  Cranial Nerves: II:  Visual fields difficult to test in the context of his aphasia but grossly intact temporal fields OS and OD with no extinction to DSS. PERRL.  III,IV, VI: No ptosis. EOM with saccadic pursuits but able to track fully to left and right.  V,VII: Smile symmetric, facial light touch sensation equal bilaterally with no extinction to DSS.  VIII: hearing intact to voice IX,X: No hypophonia XI: Head is midline XII: midline tongue extension  Motor: RUE and RLE 5/5 LUE and LLE with equivocal subtle hesitancy to initiation of movement relative to the right, but no definite weakness.  No pronator drift.   Sensory: Equivocal decreased responsiveness to LUE and LLE tactile stimulation.  Deep Tendon Reflexes:  2+ x 4.  Plantars: Right: downgoing   Left: downgoing Cerebellar: No ataxia with FNF bilaterally. Unable to follow  command for H-S. Gait: Deferred   Results for orders placed or performed during the hospital encounter of 09/22/20 (from the past 48 hour(s))  CBG monitoring, ED     Status: Abnormal   Collection Time: 09/22/20  5:56 PM  Result Value Ref Range   Glucose-Capillary 145 (H) 70 - 99 mg/dL    Comment: Glucose reference range applies only to samples taken after fasting for at least 8 hours.  Protime-INR     Status: Abnormal   Collection Time: 09/22/20  6:00 PM  Result Value Ref Range   Prothrombin Time 16.3 (H) 11.4 - 15.2 seconds   INR 1.3 (H) 0.8 - 1.2    Comment: (NOTE) INR goal varies based on device  and disease states. Performed at [facility], [address] [place], [place] [address]   APTT     Status: None   Collection Time: 09/22/20  6:00 PM  Result Value Ref Range   aPTT 34 24 - 36 seconds    Comment: Performed at [facility], [address], [place], [place] [address]  CBC     Status: None   Collection Time: 09/22/20  6:00 PM  Result Value Ref Range   WBC 9.9 4.0 - 10.5 K/uL   RBC 5.01 4.22 - 5.81 MIL/uL   Hemoglobin 15.5 13.0 - 17.0 g/dL   HCT 75.6 43.3 - 29.5 %   MCV 86.8 80.0 - 100.0 fL   MCH 30.9 26.0 - 34.0 pg   MCHC 35.6 30.0 - 36.0 g/dL   RDW 18.8 41.6 - 60.6 %   Platelets 216 150 - 400 K/uL   nRBC 0.0 0.0 - 0.2 %    Comment: Performed at [facility], [address], [place], [place] [address]  Differential     Status: Abnormal   Collection Time: 09/22/20  6:00 PM  Result Value Ref Range   Neutrophils Relative % 87 %   Neutro Abs 8.6 (H) 1.7 - 7.7 K/uL   Lymphocytes Relative 5 %   Lymphs Abs 0.5 (L) 0.7 - 4.0 K/uL   Monocytes Relative 7 %   Monocytes Absolute 0.7 0.1 - 1.0 K/uL   Eosinophils Relative 0 %   Eosinophils Absolute 0.0 0.0 - 0.5 K/uL   Basophils Relative 0 %   Basophils Absolute 0.0 0.0 - 0.1 K/uL   Immature Granulocytes 1 %   Abs Immature Granulocytes 0.07 0.00 - 0.07 K/uL    Comment: Performed at [facility], [address], [place], [place] [address]  Comprehensive metabolic panel     Status: Abnormal   Collection Time: 09/22/20  6:00 PM  Result Value Ref Range   Sodium 117 (LL) 135 - 145 mmol/L    Comment: CRITICAL RESULT CALLED TO, READ BACK BY AND VERIFIED WITH: Thornell Mule RN BY SSTEPHENS [id number]    Potassium 4.1 3.5 - 5.1 mmol/L   Chloride 86 (L) 98 - 111 mmol/L   CO2 20 (L) 22 - 32 mmol/L   Glucose, Bld 124 (H) 70 - 99 mg/dL    Comment: Glucose reference range applies only to samples taken after fasting for at least 8 hours.   BUN 11 8 - 23 mg/dL   Creatinine, Ser 3.55 0.61 - 1.24 mg/dL   Calcium  8.2 (L) 8.9 - 10.3 mg/dL   Total Protein 7.2 6.5 - 8.1 g/dL   Albumin 3.2 (L) 3.5 - 5.0 g/dL   AST 34 15 - 41  U/L   ALT 24 0 - 44 U/L   Alkaline Phosphatase 64 38 - 126 U/L   Total Bilirubin 1.0 0.3 - 1.2 mg/dL   GFR, Estimated >16 >10 mL/min    Comment: (NOTE) Calculated using the CKD-EPI Creatinine Equation (2021)    Anion gap 11 5 - 15    Comment: Performed at [facility], [address], [place], [place] [address]  I-stat chem 8, ED     Status: Abnormal   Collection Time: 09/22/20  6:09 PM  Result Value Ref Range   Sodium 118 (LL) 135 - 145 mmol/L   Potassium 3.7 3.5 - 5.1 mmol/L   Chloride 85 (L) 98 - 111 mmol/L   BUN 13 8 - 23 mg/dL   Creatinine, Ser 4.09 0.61 - 1.24 mg/dL   Glucose, Bld 811 (H) 70 - 99 mg/dL    Comment: Glucose reference range applies only to samples taken after fasting for at least 8 hours.   Calcium, Ion 0.94 (L) 1.15 - 1.40 mmol/L   TCO2 26 22 - 32 mmol/L   Hemoglobin 15.6 13.0 - 17.0 g/dL   HCT 91.4 78.2 - 95.6 %   Comment NOTIFIED PHYSICIAN   Lactic acid, plasma     Status: None   Collection Time: 09/22/20  6:56 PM  Result Value Ref Range   Lactic Acid, Venous 1.7 0.5 - 1.9 mmol/L    Comment: Performed at [facility], [address], [place], [place] [address]  Urinalysis, Routine w reflex microscopic Urine, Unspecified Source     Status: Abnormal   Collection Time: 09/22/20  8:13 PM  Result Value Ref Range   Color, Urine YELLOW YELLOW   APPearance CLEAR CLEAR   Specific Gravity, Urine >1.046 (H) 1.005 - 1.030   pH 5.0 5.0 - 8.0   Glucose, UA NEGATIVE NEGATIVE mg/dL   Hgb urine dipstick MODERATE (A) NEGATIVE   Bilirubin Urine NEGATIVE NEGATIVE   Ketones, ur 20 (A) NEGATIVE mg/dL   Protein, ur 657 (A) NEGATIVE mg/dL   Nitrite NEGATIVE NEGATIVE   Leukocytes,Ua NEGATIVE NEGATIVE   RBC / HPF 11-20 0 - 5 RBC/hpf   WBC, UA 6-10 0 - 5 WBC/hpf   Bacteria, UA NONE SEEN NONE SEEN   Squamous Epithelial / LPF 0-5 0 - 5    Comment:  Performed at [facility], [address], [place], [place] [address]  Sodium, urine, random     Status: None   Collection Time: 09/22/20  8:13 PM  Result Value Ref Range   Sodium, Ur 15 mmol/L    Comment: Performed at [facility], [address] [place], [place] [address]  Osmolality, urine     Status: None   Collection Time: 09/22/20  8:13 PM  Result Value Ref Range   Osmolality, Ur 716 300 - 900 mOsm/kg    Comment: Performed at [facility], [address] [place], [place] [address]  Basic metabolic panel     Status: Abnormal   Collection Time: 09/22/20  8:44 PM  Result Value Ref Range   Sodium 118 (LL) 135 - 145 mmol/L    Comment: CRITICAL RESULT CALLED TO, READ BACK BY AND VERIFIED WITH: Thornell Mule, RN.  [id number] BLANKENSHIP R.     Potassium 3.3 (L) 3.5 - 5.1 mmol/L   Chloride 87 (L) 98 - 111 mmol/L   CO2 22 22 - 32 mmol/L   Glucose, Bld 122 (H) 70 - 99  mg/dL    Comment: Glucose reference range applies only to samples taken after fasting for at least 8 hours.   BUN 10 8 - 23 mg/dL   Creatinine, Ser 8.93 0.61 - 1.24 mg/dL   Calcium 7.7 (L) 8.9 - 10.3 mg/dL   GFR, Estimated >73 >42 mL/min    Comment: (NOTE) Calculated using the CKD-EPI Creatinine Equation (2021)    Anion gap 9 5 - 15    Comment: Performed at [facility], [address], [place], [place] [address]   DG Chest Port 1 View  Result Date: 09/22/2020 CLINICAL DATA:  Questionable sepsis EXAM: PORTABLE CHEST 1 VIEW COMPARISON:  06/03/2020 FINDINGS: Cardiomegaly. Mild vascular congestion. Bibasilar airspace opacities, bibasilar airspace opacities. No effusions or overt edema. No acute bony abnormality. IMPRESSION: Cardiomegaly, vascular congestion. Bibasilar atelectasis or infiltrates. Electronically Signed   By: Charlett Nose M.D.   On: 09/22/2020 19:19   CT HEAD CODE STROKE WO CONTRAST  Result Date: 09/22/2020 CLINICAL DATA:  Code stroke. Neuro deficit, acute, stroke suspected.  Additional history provided: Last known well 1430, aphasia, LVO score 4. EXAM: CT HEAD WITHOUT CONTRAST TECHNIQUE: Contiguous axial images were obtained from the base of the skull through the vertex without intravenous contrast. COMPARISON:  Brain MRI 04/19/2020. Noncontrast head CT 04/19/2020. CT angiogram head/neck 04/19/2020. FINDINGS: Brain: Cerebral volume is normal. There is no acute intracranial hemorrhage. No demarcated cortical infarct. No extra-axial fluid collection. No evidence of an intracranial mass. No midline shift. Vascular: No hyperdense vessel. Skull: Normal. Negative for fracture or focal lesion. Sinuses/Orbits: Visualized orbits show no acute finding. Postsurgical appearance of the paranasal sinuses. Mild scattered paranasal sinus mucosal thickening at the imaged levels. Small bilateral maxillary sinus mucous retention cysts. ASPECTS (Alberta Stroke Program Early CT Score) - Ganglionic level infarction (caudate, lentiform nuclei, internal capsule, insula, M1-M3 cortex): 7 - Supraganglionic infarction (M4-M6 cortex): 3 Total score (0-10 with 10 being normal): 10 These results were communicated to Dr. Otelia Limes at [facility] pmon 9/5/2022by text page via the [facility] messaging system. IMPRESSION: No evidence of acute intracranial abnormality. Paranasal sinus disease, as described. Electronically Signed   By: Jackey Loge D.O.   On: 09/22/2020 18:12   CT ANGIO HEAD NECK W WO CM W PERF (CODE STROKE)  Result Date: 09/22/2020 CLINICAL DATA:  Neuro deficit, acute, stroke suspected. EXAM: CT ANGIOGRAPHY HEAD AND NECK CT PERFUSION BRAIN TECHNIQUE: Multidetector CT imaging of the head and neck was performed using the standard protocol during bolus administration of intravenous contrast. Multiplanar CT image reconstructions and MIPs were obtained to evaluate the vascular anatomy. Carotid stenosis measurements (when applicable) are obtained utilizing NASCET criteria, using the distal internal carotid diameter as  the denominator. Multiphase CT imaging of the brain was performed following IV bolus contrast injection. Subsequent parametric perfusion maps were calculated using RAPID software. CONTRAST:  Administered contrast not known at this time. COMPARISON:  Noncontrast head CT performed earlier today 09/22/2020. Brain MRI 04/19/2020. CT angiogram head/neck 04/19/2020. FINDINGS: CTA NECK FINDINGS Aortic arch: Standard aortic branching. Atherosclerotic plaque within the visualized aortic arch. Streak artifact from a dense left-sided contrast bolus partially obscures the left subclavian artery. Within this limitation, there is no appreciable hemodynamically significant stenosis within the innominate or proximal subclavian arteries. Right carotid system: CCA and ICA patent within the neck without stenosis. Minimal atherosclerotic plaque within the carotid bifurcation. Left carotid system: CCA and ICA patent within the neck without stenosis. Mild soft and calcified plaque within the carotid bifurcation and proximal  ICA. Vertebral arteries: Beam hardening artifact from a dense left-sided contrast bolus partially obscures the proximal left vertebral artery. Within this limitation, the vertebral arteries are codominant and patent within the neck without stenosis. Skeleton: Cervical spondylosis. Fusion across the C5-C6 disc space with associated left-sided facet joint ankylosis at this level. No acute bony abnormality or aggressive osseous lesion. Other neck: No neck mass or cervical lymphadenopathy. Upper chest: No consolidation within the imaged lung apices. Review of the MIP images confirms the above findings CTA HEAD FINDINGS Anterior circulation: The intracranial internal carotid arteries are patent. No M2 proximal branch occlusion or high-grade proximal stenosis is identified. The anterior cerebral arteries are patent. No intracranial aneurysm is identified. Posterior circulation: The intracranial vertebral arteries are  patent. The basilar artery is patent. The posterior cerebral arteries are patent. The posterior cerebral arteries are patent. Posterior communicating arteries are hypoplastic or absent bilaterally. Venous sinuses: Within the limitations of contrast timing, no convincing thrombus. Anatomic variants: As described Review of the MIP images confirms the above findings CT Brain Perfusion Findings: The CT perfusion post processing failed. In reviewing the source imaging, this is likely due to patient motion. No large vessel occlusion identified. These results were called by telephone at the time of interpretation on 09/22/2020 at 6:40 pm to provider Meggen Spaziani [facility] , who verbally acknowledged these results. IMPRESSION: CTA neck: Streak and beam hardening artifact arising from a dense left-sided contrast bolus partially obscures the proximal left vertebral artery. Within this limitation, the common carotid, internal carotid and vertebral arteries are patent within the neck without stenosis. Mild atherosclerotic plaque within the bilateral carotid systems within the neck, as described. CTA head: No intracranial large vessel occlusion or proximal high-grade arterial stenosis identified. CT perfusion head: Failed CT perfusion, likely due to patient motion. Electronically Signed   By: Jackey Loge D.O.   On: 09/22/2020 18:51    Assessment: 70 y.o. male presenting with acute onset of expressive and receptive aphasia. Overall presentation is most consistent with a cortically-based ischemic infarction. Initial exam suggested a right MCA stroke with suspected right-hemisphere speech dominance at baseline. Repeat exam showed new onset of right facial droop, making left MCA territory localization more likely.  1. Exam reveals receptive and expressive dysphasia with equivocal subtle left upper and lower extremity sensory and motor deficits.  2. CT head without hemorrhage or acute hypodensity.  3. CTA neck: Streak and beam hardening  artifact arising from a dense left-sided contrast bolus partially obscures the proximal left vertebral artery. Within this limitation, the common carotid, internal carotid and vertebral arteries are patent within the neck without stenosis. Mild atherosclerotic plaque within the bilateral carotid systems within the neck is noted.  4. CTA head: No intracranial large vessel occlusion or proximal high-grade arterial stenosis identified.  5. CT perfusion head: Failed CT perfusion, likely due to patient motion 6. Stroke Risk Factors - HLD 7. After comprehensive review of possible contraindications, he has no absolute contraindications to TNK administration.  8. Patient is a TNK candidate. Patient's aphasia precludes meaningful medical decision making on his part at this time. Family not available to obtain informed consent. Overall benefits of tPA regarding long-term prognosis are felt to outweigh risks. Discussed with Dr. Lynelle Doctor. Two-physician emergency decision has been made to administer TNK given patient's inability to give informed consent and unavailability of family.  o  Plan: 1. Admitting to Neuro ICU.  2. Post-TNK order set to include frequent neuro checks and BP management.  3. No antiplatelet  medications or anticoagulants for at least 24 hours following TNK.  4. DVT prophylaxis with SCDs.  5. Will need to be started on a statin.  6. Will need to start antiplatelet therapy if follow up CT at 24 hours is negative for hemorrhagic conversion. 7. Cardiac telemetry  8. TTE.  9. MRI brain  10. PT/OT/Speech.  11. NPO until passes swallow evaluation.  12. Fasting lipid panel, HgbA1c  50 minutes spent in the emergent neurological evaluation and management of this critically ill patient.   Electronically signed: Dr. Caryl PinaEric Fernande Treiber 09/22/2020, 9:59 PM

## 2020-09-22 NOTE — ED Provider Notes (Signed)
[facility] EMERGENCY [facility] Provider Note   CSN: [id number] Arrival date & time: 09/22/20  1754  An emergency department physician performed an initial assessment on this suspected stroke patient at 1755.  History Chief complaint, speech difficulty, confusion, code stroke activated  Alexander Pierce is a 70 y.o. male.  HPI  Patient presents to the ED for evaluation of possible acute stroke.  Patient noted to have increased confusion within the last few hours.  Patient denies any trouble with chest pain or shortness of breath.  No abdominal pain.  No dysuria.  Additional history provided by family.  Patient apparently has been having some fatigue.  Questionable fevers at home Wife states that he has been feverish and fatigued recently but has been fatigued.  At 2:30 PM he had sudden onset of difficulty speaking and confusion.  EMS noted he was aphasic and had slurred speech.  Code stroke was activated. Past Medical History:  Diagnosis Date   ALLERGIC RHINITIS 06/29/2007   Qualifier: Diagnosis of  By: Jonny RuizJohn MD, Len BlalockJames W    Allergy    seasonal   HYPERLIPIDEMIA 06/29/2007   Qualifier: Diagnosis of  By: Jonny RuizJohn MD, Len BlalockJames W    Right inguinal hernia 06/24/2011    Patient Active Problem List   Diagnosis Date Noted   Cough 06/03/2020   Hyperglycemia 04/24/2020   Orthostasis 04/24/2020   Blood pressure elevated without history of HTN 02/27/2019   Vitamin D deficiency 02/27/2019   Low back pain 02/27/2019   Finger infection 07/20/2018   Right lateral epicondylitis 01/23/2018   Hyponatremia 05/04/2016   Tick bite of left upper arm 05/03/2016   Fever 05/03/2016   Upper respiratory tract infection 05/03/2016   Degenerative arthritis of knee, 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 07/09/2011   Skin lesion 06/24/2011   Encounter for well adult exam with abnormal findings 06/19/2011   Hyperlipidemia 06/29/2007   Allergic rhinitis 06/29/2007    Past Surgical History:  Procedure Laterality Date   INGUINAL HERNIA REPAIR     sinus surgury  1992   dr Haroldine Lawscrossley       Family History  Problem Relation Age of Onset   Parkinsonism Other    Prostate cancer Other    Hypertension Other    Colon cancer Neg Hx    Rectal cancer Neg Hx    Stomach cancer Neg Hx     Social History   Tobacco Use   Smoking status: Never   Smokeless tobacco: Never  Substance Use Topics   Alcohol use: No   Drug use: No    Home Medications Prior to Admission medications   Medication Sig Start Date End Date Taking? Authorizing Provider  Cetirizine HCl 10 MG CAPS Take 1 capsule (10 mg total) by mouth daily for 10 days. 04/28/20 05/08/20  Wieters, Hallie C, PA-C    Allergies    Acetaminophen, Aspirin, Ibuprofen, and Penicillins  Review of Systems   Review of Systems  All other systems reviewed and are negative.  Physical Exam Updated Vital Signs BP (!) 159/99   Pulse (!) 102   Temp (!) 103 F (39.4 C) (Oral)   Resp Marland Kitchen(!)  33   Wt 96.2 kg   SpO2 94%   BMI 27.23 kg/m   Physical Exam Vitals and nursing note reviewed.  Constitutional:      Appearance: He is well-developed. He is not ill-appearing.  HENT:     Head: Normocephalic and atraumatic.     Right Ear: External ear normal.     Left Ear: External ear normal.  Eyes:     General: No scleral icterus.       Right eye: No discharge.        Left eye: No discharge.     Conjunctiva/sclera: Conjunctivae normal.  Neck:     Trachea: No tracheal deviation.  Cardiovascular:     Rate and Rhythm: Normal rate and regular rhythm.  Pulmonary:      Effort: Pulmonary effort is normal. No respiratory distress.     Breath sounds: Normal breath sounds. No stridor. No wheezing or rales.  Abdominal:     General: Bowel sounds are normal. There is no distension.     Palpations: Abdomen is soft.     Tenderness: There is no abdominal tenderness. There is no guarding or rebound.  Musculoskeletal:        General: No tenderness or deformity.     Cervical back: Neck supple.  Skin:    General: Skin is warm and dry.     Findings: No rash.  Neurological:     General: No focal deficit present.     Mental Status: He is alert.     Cranial Nerves: No cranial nerve deficit (Possible left-sided facial droop,, mild difficulty with speech).     Sensory: No sensory deficit.     Motor: Weakness present. No abnormal muscle tone or seizure activity.     Comments: Difficulty following commands,  Psychiatric:        Mood and Affect: Mood normal.    ED Results / Procedures / Treatments   Labs (all labs ordered are listed, but only abnormal results are displayed) Labs Reviewed  PROTIME-INR - Abnormal; Notable for the following components:      Result Value   Prothrombin Time 16.3 (*)    INR 1.3 (*)    All other components within normal limits  DIFFERENTIAL - Abnormal; Notable for the following components:   Neutro Abs 8.6 (*)    Lymphs Abs 0.5 (*)    All other components within normal limits  COMPREHENSIVE METABOLIC PANEL - Abnormal; Notable for the following components:   Sodium 117 (*)    Chloride 86 (*)    CO2 20 (*)    Glucose, Bld 124 (*)    Calcium 8.2 (*)    Albumin 3.2 (*)    All other components within normal limits  I-STAT CHEM 8, ED - Abnormal; Notable for the following components:   Sodium 118 (*)    Chloride 85 (*)    Glucose, Bld 127 (*)    Calcium, Ion 0.94 (*)    All other components within normal limits  CBG MONITORING, ED - Abnormal; Notable for the following components:   Glucose-Capillary 145 (*)    All other components  within normal limits  CULTURE, BLOOD (SINGLE)  URINE CULTURE  APTT  CBC  LACTIC ACID, PLASMA  LACTIC ACID, PLASMA  URINALYSIS, ROUTINE W REFLEX MICROSCOPIC    EKG EKG Interpretation  Date/Time:  Monday September 22 2020 18:43:03 EDT Ventricular Rate:  105 PR Interval:  149 QRS Duration: 95 QT Interval:    QTC Calculation:  R Axis:   -55 Text Interpretation: Sinus tachycardia LAE, consider biatrial enlargement Left anterior fascicular block Left ventricular hypertrophy Since last tracing rate faster Confirmed by Linwood Dibbles [phone]) on 09/22/2020 6:50:29 PM  Radiology DG Chest Port 1 View  Result Date: 09/22/2020 CLINICAL DATA:  Questionable sepsis EXAM: PORTABLE CHEST 1 VIEW COMPARISON:  06/03/2020 FINDINGS: Cardiomegaly. Mild vascular congestion. Bibasilar airspace opacities, bibasilar airspace opacities. No effusions or overt edema. No acute bony abnormality. IMPRESSION: Cardiomegaly, vascular congestion. Bibasilar atelectasis or infiltrates. Electronically Signed   By: Charlett Nose M.D.   On: 09/22/2020 19:19   CT HEAD CODE STROKE WO CONTRAST  Result Date: 09/22/2020 CLINICAL DATA:  Code stroke. Neuro deficit, acute, stroke suspected. Additional history provided: Last known well 1430, aphasia, LVO score 4. EXAM: CT HEAD WITHOUT CONTRAST TECHNIQUE: Contiguous axial images were obtained from the base of the skull through the vertex without intravenous contrast. COMPARISON:  Brain MRI 04/19/2020. Noncontrast head CT 04/19/2020. CT angiogram head/neck 04/19/2020. FINDINGS: Brain: Cerebral volume is normal. There is no acute intracranial hemorrhage. No demarcated cortical infarct. No extra-axial fluid collection. No evidence of an intracranial mass. No midline shift. Vascular: No hyperdense vessel. Skull: Normal. Negative for fracture or focal lesion. Sinuses/Orbits: Visualized orbits show no acute finding. Postsurgical appearance of the paranasal sinuses. Mild scattered paranasal sinus mucosal  thickening at the imaged levels. Small bilateral maxillary sinus mucous retention cysts. ASPECTS ([place] Stroke Program Early CT Score) - Ganglionic level infarction (caudate, lentiform nuclei, internal capsule, insula, M1-M3 cortex): 7 - Supraganglionic infarction (M4-M6 cortex): 3 Total score (0-10 with 10 being normal): 10 These results were communicated to Dr. Otelia Limes at [facility] pmon 9/5/2022by text page via the [facility] messaging system. IMPRESSION: No evidence of acute intracranial abnormality. Paranasal sinus disease, as described. Electronically Signed   By: Jackey Loge D.O.   On: 09/22/2020 18:12   CT ANGIO HEAD NECK W WO CM W PERF (CODE STROKE)  Result Date: 09/22/2020 CLINICAL DATA:  Neuro deficit, acute, stroke suspected. EXAM: CT ANGIOGRAPHY HEAD AND NECK CT PERFUSION BRAIN TECHNIQUE: Multidetector CT imaging of the head and neck was performed using the standard protocol during bolus administration of intravenous contrast. Multiplanar CT image reconstructions and MIPs were obtained to evaluate the vascular anatomy. Carotid stenosis measurements (when applicable) are obtained utilizing NASCET criteria, using the distal internal carotid diameter as the denominator. Multiphase CT imaging of the brain was performed following IV bolus contrast injection. Subsequent parametric perfusion maps were calculated using RAPID software. CONTRAST:  Administered contrast not known at this time. COMPARISON:  Noncontrast head CT performed earlier today 09/22/2020. Brain MRI 04/19/2020. CT angiogram head/neck 04/19/2020. FINDINGS: CTA NECK FINDINGS Aortic arch: Standard aortic branching. Atherosclerotic plaque within the visualized aortic arch. Streak artifact from a dense left-sided contrast bolus partially obscures the left subclavian artery. Within this limitation, there is no appreciable hemodynamically significant stenosis within the innominate or proximal subclavian arteries. Right carotid system: CCA and ICA patent  within the neck without stenosis. Minimal atherosclerotic plaque within the carotid bifurcation. Left carotid system: CCA and ICA patent within the neck without stenosis. Mild soft and calcified plaque within the carotid bifurcation and proximal ICA. Vertebral arteries: Beam hardening artifact from a dense left-sided contrast bolus partially obscures the proximal left vertebral artery. Within this limitation, the vertebral arteries are codominant and patent within the neck without stenosis. Skeleton: Cervical spondylosis. Fusion across the C5-C6 disc space with associated left-sided facet joint ankylosis at this level. No acute bony abnormality or  aggressive osseous lesion. Other neck: No neck mass or cervical lymphadenopathy. Upper chest: No consolidation within the imaged lung apices. Review of the MIP images confirms the above findings CTA HEAD FINDINGS Anterior circulation: The intracranial internal carotid arteries are patent. No M2 proximal branch occlusion or high-grade proximal stenosis is identified. The anterior cerebral arteries are patent. No intracranial aneurysm is identified. Posterior circulation: The intracranial vertebral arteries are patent. The basilar artery is patent. The posterior cerebral arteries are patent. The posterior cerebral arteries are patent. Posterior communicating arteries are hypoplastic or absent bilaterally. Venous sinuses: Within the limitations of contrast timing, no convincing thrombus. Anatomic variants: As described Review of the MIP images confirms the above findings CT Brain Perfusion Findings: The CT perfusion post processing failed. In reviewing the source imaging, this is likely due to patient motion. No large vessel occlusion identified. These results were called by telephone at the time of interpretation on 09/22/2020 at 6:40 pm to provider ERIC [facility] , who verbally acknowledged these results. IMPRESSION: CTA neck: Streak and beam hardening artifact arising from a  dense left-sided contrast bolus partially obscures the proximal left vertebral artery. Within this limitation, the common carotid, internal carotid and vertebral arteries are patent within the neck without stenosis. Mild atherosclerotic plaque within the bilateral carotid systems within the neck, as described. CTA head: No intracranial large vessel occlusion or proximal high-grade arterial stenosis identified. CT perfusion head: Failed CT perfusion, likely due to patient motion. Electronically Signed   By: Jackey Loge D.O.   On: 09/22/2020 18:51    Procedures .Critical Care  Date/Time: 09/22/2020 7:57 PM Performed by: Linwood Dibbles, MD Authorized by: Linwood Dibbles, MD   Critical care provider statement:    Critical care time (minutes):  45   Critical care was time spent personally by me on the following activities:  Discussions with consultants, evaluation of patient's response to treatment, examination of patient, ordering and performing treatments and interventions, ordering and review of laboratory studies, ordering and review of radiographic studies, pulse oximetry, re-evaluation of patient's condition, obtaining history from patient or surrogate and review of old charts   Medications Ordered in ED Medications  labetalol (NORMODYNE) injection 10 mg (0 mg Intravenous Hold 09/22/20 1935)  clevidipine (CLEVIPREX) infusion 0.5 mg/mL (0 mg/hr Intravenous Hold 09/22/20 1935)  lactated ringers bolus 1,000 mL (1,000 mLs Intravenous New Bag/Given 09/22/20 1944)  lactated ringers infusion (has no administration in time range)  cefTRIAXone (ROCEPHIN) 1 g in sodium chloride 0.9 % 100 mL IVPB (1 g Intravenous New Bag/Given 09/22/20 1935)  sodium chloride flush (NS) 0.9 % injection 3 mL (3 mLs Intravenous Given 09/22/20 1936)  tenecteplase (TNKASE) injection for Stroke 24 mg (24 mg Intravenous Given 09/22/20 1825)  iohexol (OMNIPAQUE) 350 MG/ML injection 100 mL (100 mLs Intravenous Contrast Given 09/22/20 1828)    ED  Course  I have reviewed the triage vital signs and the nursing notes.  Pertinent labs & imaging results that were available during my care of the patient were reviewed by me and considered in my medical decision making (see chart for details).    MDM Rules/Calculators/A&P                          Patient presented with acute onset stroke symptoms.  CT scan without acute hemorrhage.  Case discussed with Dr. Otelia Limes.  He recommends tPA.  Patient does have some confusion and does not seem to be able to completely understand.  Family is not present to consent.  I did plan to give the patient thrombolytics to treat his stroke.  I agree with Dr. Otelia Limes that it is appropriate to proceed with TNKase at this time.  Patient also now noted to have a temperature of 103.  Blood pressure is stable.  Patient was given TNKase.  He will be going to the neuro ICU.  We will add on some infectious screening tests and give a dose of empiric antibiotics.  Patient is going to be admitted to the neuro ICU.  Labs did show the patient was hyponatremic.  Neurology service has consulted with intensivist for further assistance of his infectious symptoms and hyponatremia. Final Clinical Impression(s) / ED Diagnoses Final diagnoses:  Cerebrovascular accident (CVA), unspecified mechanism (HCC)  Fever, unspecified fever cause  Hyponatremia     Linwood Dibbles, MD 09/22/20 [phone]

## 2020-09-22 NOTE — Progress Notes (Signed)
PHARMACIST CODE STROKE RESPONSE  Notified to mix TNK at 1818 by Dr. Otelia Limes Delivered TNK to RN at 1822  TNK dose = 24 mg IV over 5 seconds  Issues/delays encountered (if applicable): Had to discuss decision with ED MD given inability to obtain consent from patient due to comprehension status  Delmar Landau, PharmD, BCPS 09/22/2020 6:26 PM ED Clinical Pharmacist -  [phone]

## 2020-09-22 NOTE — Progress Notes (Signed)
Pharmacy Antibiotic Note  Alexander Pierce is a 70 y.o. male admitted on 09/22/2020 with sepsis.  Pharmacy has been consulted for cefepime and vancomycin dosing.  Patient with a history of allergic rhinitis and hyperlipidemia. Patient presenting as code stroke with fever, confusion and slurred speech. Patient received 1g ceftriaxone in the ED prior to vancomycin and cefepime consult being placed.  SCr is 0.9; WBC 9.9; LA 1.7; T 103 F  Plan: Cefepime 2g q8h Vancomycin 2000mg  once then 1250 mg q12hr (eAUC 468) unless change in renal function Monitor cultures Trend fever, WBC De-escalate antibiotics if appropriate  Weight: 96.2 kg (212 lb 1.3 oz)  Temp (24hrs), Avg:103 F (39.4 C), Min:103 F (39.4 C), Max:103 F (39.4 C)  Recent Labs  Lab 09/22/20 1800 09/22/20 1809 09/22/20 1856  WBC 9.9  --   --   CREATININE 0.97 0.90  --   LATICACIDVEN  --   --  1.7    Estimated Creatinine Clearance: 87.5 mL/min (by C-G formula based on SCr of 0.9 mg/dL).    Allergies  Allergen Reactions   Acetaminophen     REACTION: eyes swell   Aspirin     REACTION: eyes swell   Ibuprofen     REACTION: eyes swell   Penicillins     REACTION: pt cant remember    Antimicrobials this admission: Ceftriaxone x 1 in ED 9/5 cefepime 9/5 >>  vancomycin 9/5 >>   Microbiology results: Pending  Thank you for allowing pharmacy to be a part of this patient's care.  11/5, PharmD, BCPS 09/22/2020 9:24 PM ED Clinical Pharmacist -  [phone]

## 2020-09-23 ENCOUNTER — Inpatient Hospital Stay (HOSPITAL_COMMUNITY): Payer: BC Managed Care – PPO

## 2020-09-23 DIAGNOSIS — E871 Hypo-osmolality and hyponatremia: Secondary | ICD-10-CM | POA: Diagnosis not present

## 2020-09-23 DIAGNOSIS — E7800 Pure hypercholesterolemia, unspecified: Secondary | ICD-10-CM

## 2020-09-23 DIAGNOSIS — R509 Fever, unspecified: Secondary | ICD-10-CM | POA: Diagnosis not present

## 2020-09-23 DIAGNOSIS — Z9189 Other specified personal risk factors, not elsewhere classified: Secondary | ICD-10-CM

## 2020-09-23 DIAGNOSIS — I639 Cerebral infarction, unspecified: Secondary | ICD-10-CM | POA: Diagnosis not present

## 2020-09-23 DIAGNOSIS — I4891 Unspecified atrial fibrillation: Secondary | ICD-10-CM

## 2020-09-23 DIAGNOSIS — I480 Paroxysmal atrial fibrillation: Secondary | ICD-10-CM

## 2020-09-23 DIAGNOSIS — R4182 Altered mental status, unspecified: Secondary | ICD-10-CM

## 2020-09-23 LAB — SODIUM
Sodium: 119 mmol/L — CL (ref 135–145)
Sodium: 122 mmol/L — ABNORMAL LOW (ref 135–145)
Sodium: 123 mmol/L — ABNORMAL LOW (ref 135–145)
Sodium: 123 mmol/L — ABNORMAL LOW (ref 135–145)
Sodium: 124 mmol/L — ABNORMAL LOW (ref 135–145)

## 2020-09-23 LAB — BASIC METABOLIC PANEL
Anion gap: 9 (ref 5–15)
BUN: 12 mg/dL (ref 8–23)
CO2: 21 mmol/L — ABNORMAL LOW (ref 22–32)
Calcium: 7.7 mg/dL — ABNORMAL LOW (ref 8.9–10.3)
Chloride: 90 mmol/L — ABNORMAL LOW (ref 98–111)
Creatinine, Ser: 0.97 mg/dL (ref 0.61–1.24)
GFR, Estimated: >60 mL/min (ref >60)
Glucose, Bld: 118 mg/dL — ABNORMAL HIGH (ref 70–99)
Potassium: 3.5 mmol/L (ref 3.5–5.1)
Sodium: 120 mmol/L — ABNORMAL LOW (ref 135–145)

## 2020-09-23 LAB — ECHOCARDIOGRAM COMPLETE
AR max vel: 1.98 cm2
AV Area VTI: 2.24 cm2
AV Area mean vel: 1.91 cm2
AV Mean grad: 13.7 mmHg
AV Peak grad: 23.7 mmHg
Ao pk vel: 2.43 m/s
Calc EF: 37.9 %
MV M vel: 5.23 m/s
MV Peak grad: 109.4 mmHg
P 1/2 time: 345 msec
S' Lateral: 3.8 cm
Single Plane A2C EF: 27.8 %
Single Plane A4C EF: 48.9 %
Weight: 3393.32 oz

## 2020-09-23 LAB — CBC
HCT: 38.1 % — ABNORMAL LOW (ref 39.0–52.0)
Hemoglobin: 14.1 g/dL (ref 13.0–17.0)
MCH: 30.8 pg (ref 26.0–34.0)
MCHC: 37 g/dL — ABNORMAL HIGH (ref 30.0–36.0)
MCV: 83.2 fL (ref 80.0–100.0)
Platelets: 199 10*3/uL (ref 150–400)
RBC: 4.58 MIL/uL (ref 4.22–5.81)
RDW: 12.7 % (ref 11.5–15.5)
WBC: 8.2 10*3/uL (ref 4.0–10.5)
nRBC: 0 % (ref 0.0–0.2)

## 2020-09-23 LAB — MAGNESIUM: Magnesium: 1.9 mg/dL (ref 1.7–2.4)

## 2020-09-23 LAB — TSH: TSH: 0.854 u[IU]/mL (ref 0.350–4.500)

## 2020-09-23 LAB — PHOSPHORUS: Phosphorus: 2.1 mg/dL — ABNORMAL LOW (ref 2.5–4.6)

## 2020-09-23 LAB — PROCALCITONIN
Procalcitonin: 0.16 ng/mL
Procalcitonin: 0.27 ng/mL

## 2020-09-23 LAB — HEMOGLOBIN A1C
Hgb A1c MFr Bld: 5.9 % — ABNORMAL HIGH (ref 4.8–5.6)
Mean Plasma Glucose: 122.63 mg/dL

## 2020-09-23 LAB — GLUCOSE, CAPILLARY
Glucose-Capillary: 103 mg/dL — ABNORMAL HIGH (ref 70–99)
Glucose-Capillary: 119 mg/dL — ABNORMAL HIGH (ref 70–99)
Glucose-Capillary: 111 mg/dL — ABNORMAL HIGH (ref 70–99)
Glucose-Capillary: 106 mg/dL — ABNORMAL HIGH (ref 70–99)
Glucose-Capillary: 107 mg/dL — ABNORMAL HIGH (ref 70–99)
Glucose-Capillary: 140 mg/dL — ABNORMAL HIGH (ref 70–99)

## 2020-09-23 LAB — LIPID PANEL
Cholesterol: 149 mg/dL (ref 0–200)
HDL: 42 mg/dL (ref >40)
LDL Cholesterol: 96 mg/dL (ref 0–99)
Total CHOL/HDL Ratio: 3.5 RATIO
Triglycerides: 53 mg/dL (ref <150)
VLDL: 11 mg/dL (ref 0–40)

## 2020-09-23 LAB — MRSA NEXT GEN BY PCR, NASAL: MRSA by PCR Next Gen: NOT DETECTED

## 2020-09-23 LAB — SARS CORONAVIRUS 2 (TAT 6-24 HRS): SARS Coronavirus 2: NEGATIVE

## 2020-09-23 LAB — OSMOLALITY: Osmolality: 252 mOsm/kg — ABNORMAL LOW (ref 275–295)

## 2020-09-23 IMAGING — MR MR HEAD WO/W CM
7 of 13 series · 26 of 48 positions shown · IV contrast (gadavist)
Comparison: Head CT [DATE] and MRI [DATE]

CLINICAL DATA: Stroke follow-up.  Aphasia.

EXAM:
MRI HEAD WITHOUT AND WITH CONTRAST
TECHNIQUE: Multiplanar, multiecho pulse sequences of the brain and surrounding
structures were obtained without and with intravenous contrast.
CONTRAST:  9mL GADAVIST GADOBUTROL 1 MMOL/ML IV SOLN

[Series 2: DWI · axial · 3.0mm · 0.94mm/px · z∈[-49,+96]mm · 7 of 100 slices shown (1 of 2)]
[im 1/100]
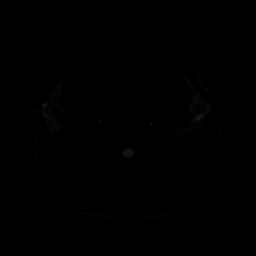
[im 17/100]
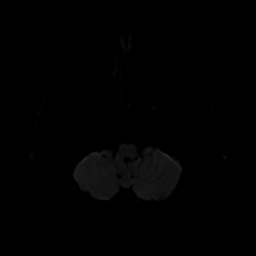
[im 34/100]
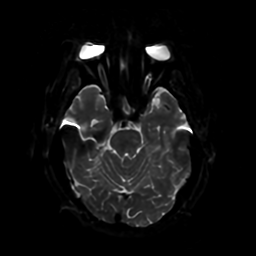
[im 50/100]
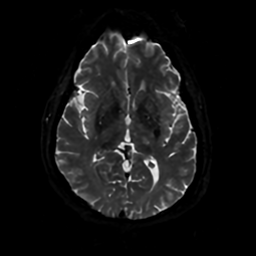
[im 67/100]
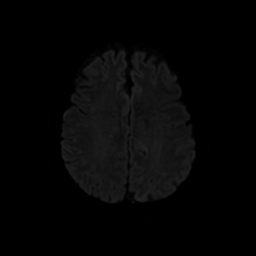
[im 83/100]
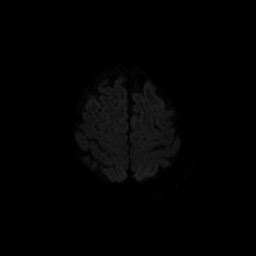
[im 100/100]
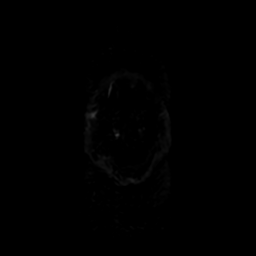

[Series 3: DWI · coronal · 4.0mm · 0.94mm/px · 5 of 74 slices shown (2 of 2)]
[im 1/74]
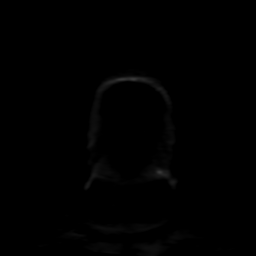
[im 19/74]
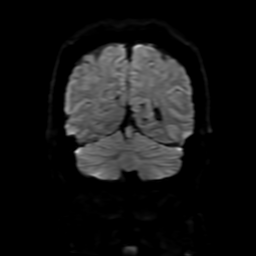
[im 37/74]
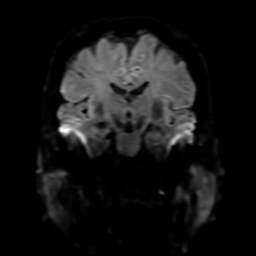
[im 55/74]
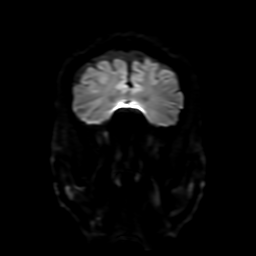
[im 74/74]
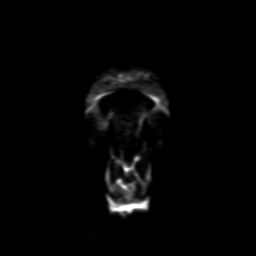

[Series 4: FLAIR · sagittal · 5.0mm · 0.23mm/px · 2 of 24 slices shown (1 of 3)]
[im 1/24]
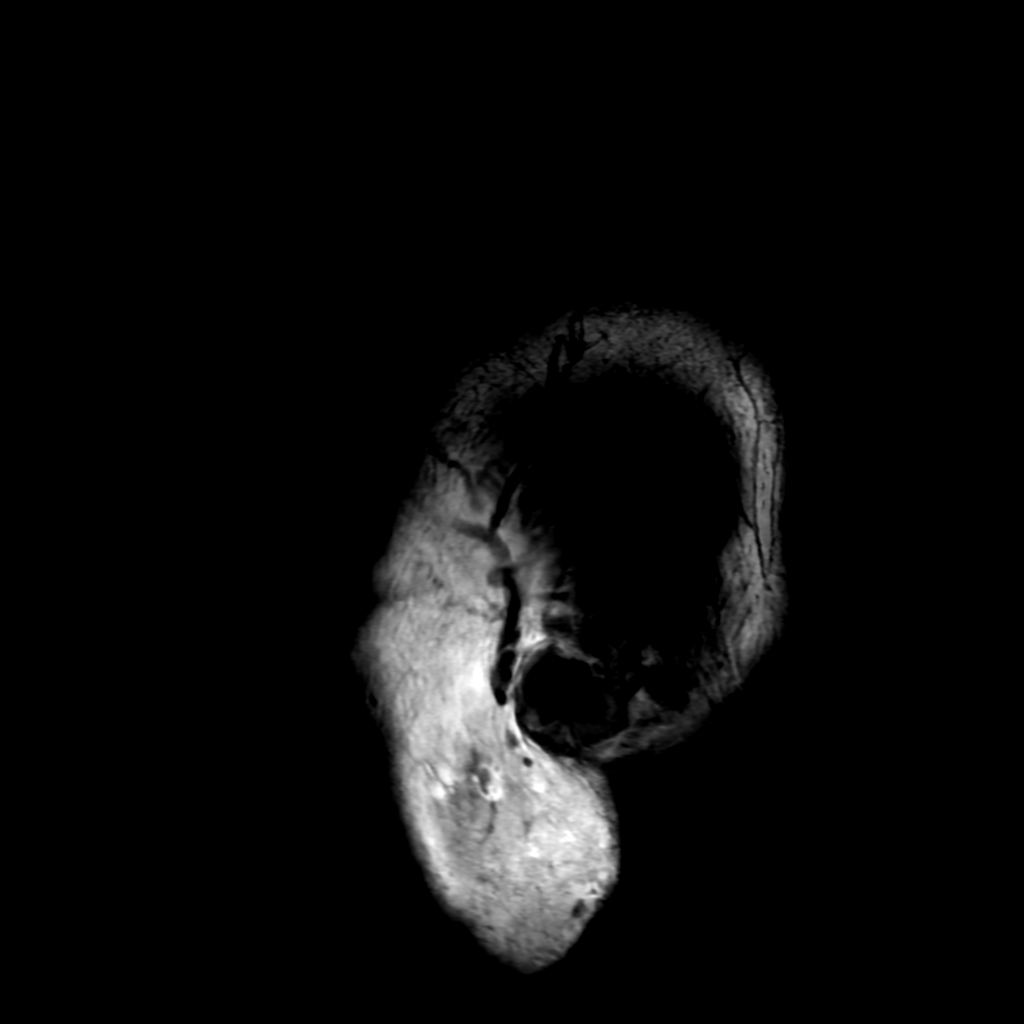
[im 24/24]
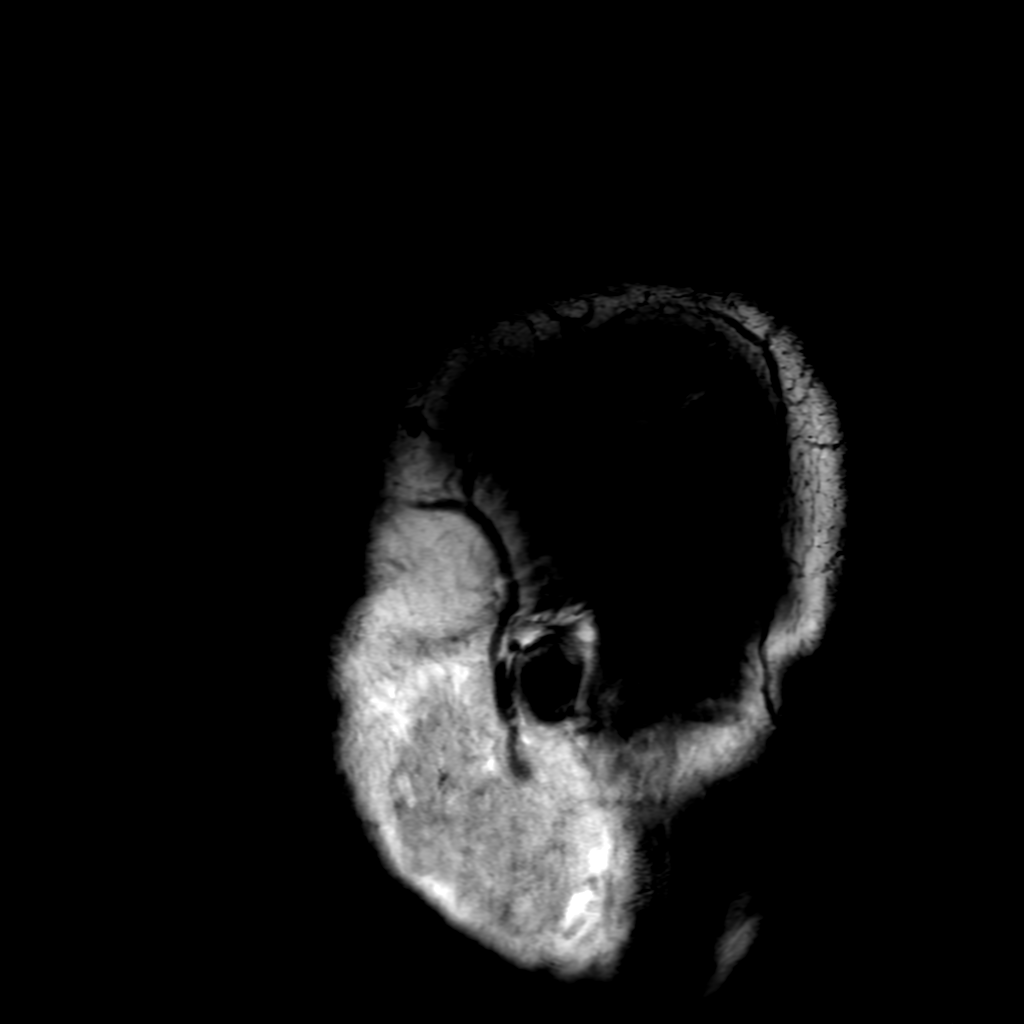

[Series 5: FLAIR · axial · 4.0mm · 0.45mm/px · z∈[-47,+101]mm · 2 of 27 slices shown (2 of 3)]
[im 1/27]
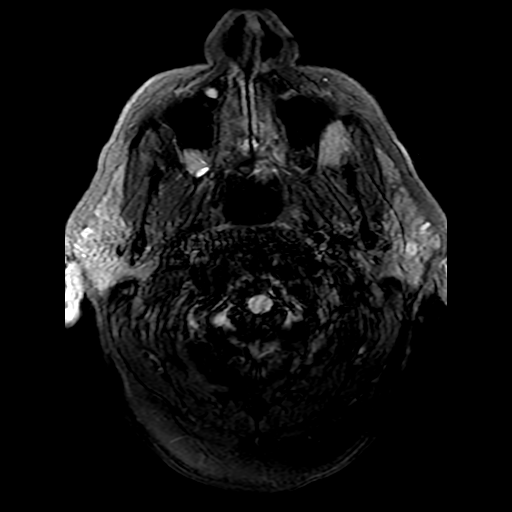
[im 27/27]
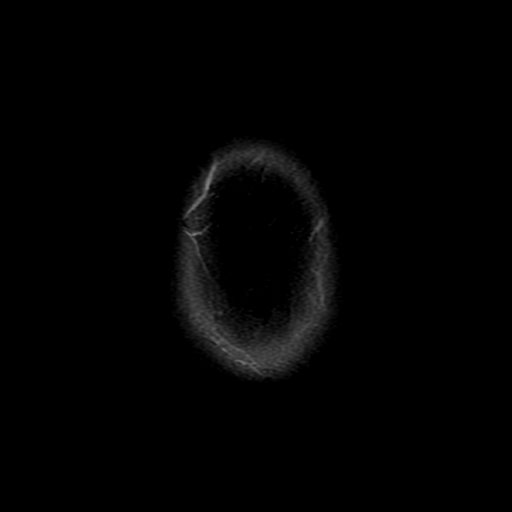

[Series 6: FLAIR · axial · 4.0mm · 0.45mm/px · z∈[-47,+101]mm · 3 of 35 slices shown (3 of 3)]
[im 1/35]
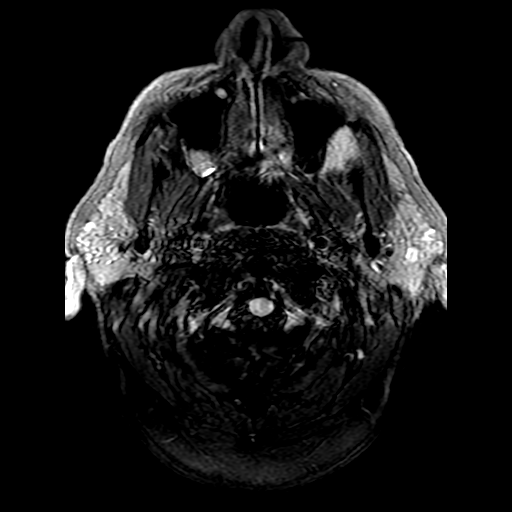
[im 18/35]
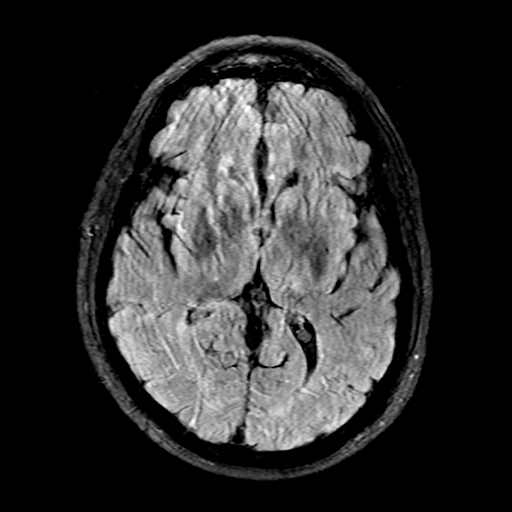
[im 35/35]
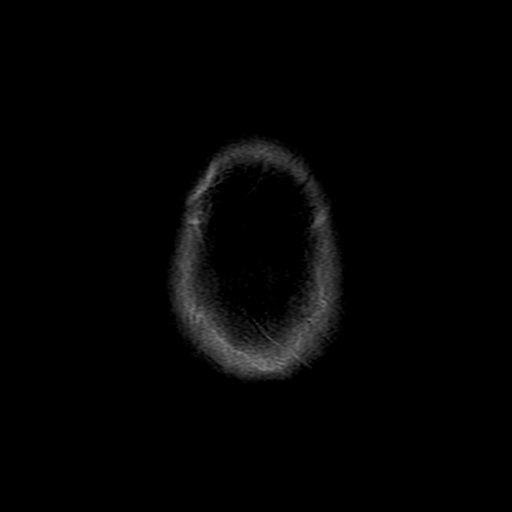

[Series 250: ADC · axial · 3.0mm · 0.94mm/px · z∈[-49,+96]mm · 4 of 50 slices shown (1 of 2)]
[im 1/50]
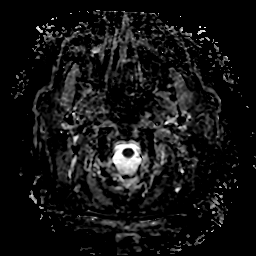
[im 17/50]
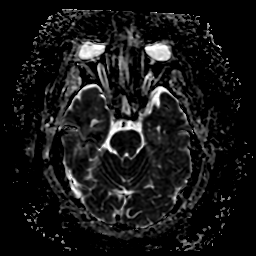
[im 33/50]
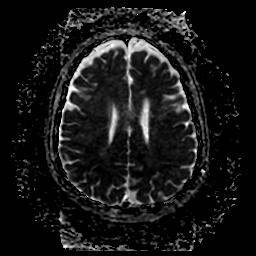
[im 50/50]
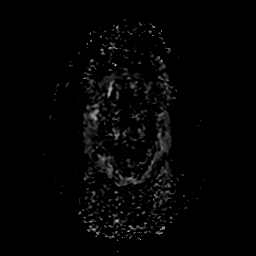

[Series 350: ADC · coronal · 4.0mm · 0.94mm/px · 3 of 37 slices shown (2 of 2)]
[im 1/37]
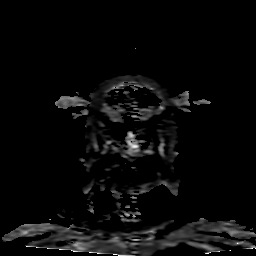
[im 19/37]
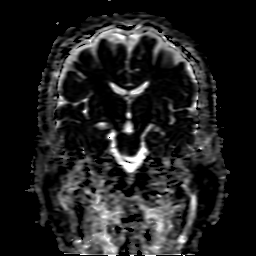
[im 37/37]
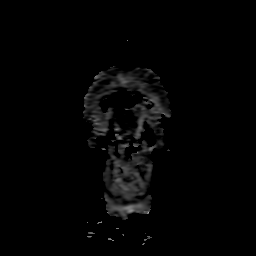

[26 of 48 positions shown; findings below may reference images not displayed]

FINDINGS: The study is mildly motion degraded.

Brain: There is no evidence of an acute infarct, intracranial
hemorrhage, mass, midline shift, or extra-axial fluid collection.
Small T2 hyperintensities in the cerebral white matter and pons are
more conspicuous than on the prior MRI and are nonspecific but
compatible with mild chronic small vessel ischemic disease. Mild
cerebral atrophy is within normal limits for age. No abnormal
enhancement is identified.

Vascular: Major intracranial vascular flow voids are preserved.

Skull and upper cervical spine: Unremarkable bone marrow signal.

Sinuses/Orbits: Unremarkable orbits. Small mucous retention cyst in
the right maxillary sinus. Clear mastoid air cells.

Other: None.
IMPRESSION: 1. No acute intracranial abnormality.
2. Mild chronic small vessel ischemic disease.

## 2020-09-23 MED ORDER — METOPROLOL TARTRATE 5 MG/5ML IV SOLN
5.0000 mg | Freq: Once | INTRAVENOUS | Status: AC
  Administered 2020-09-23: 5 mg via INTRAVENOUS
  Filled 2020-09-23: qty 5

## 2020-09-23 MED ORDER — INSULIN ASPART 100 UNIT/ML IJ SOLN
0.0000 [IU] | INTRAMUSCULAR | Status: DC
  Administered 2020-09-24 – 2020-09-26 (×8): 1 [IU] via SUBCUTANEOUS

## 2020-09-23 MED ORDER — POTASSIUM PHOSPHATES 15 MMOLE/5ML IV SOLN
15.0000 mmol | Freq: Once | INTRAVENOUS | Status: AC
  Administered 2020-09-23: 15 mmol via INTRAVENOUS
  Filled 2020-09-23: qty 5

## 2020-09-23 MED ORDER — ATORVASTATIN CALCIUM 10 MG PO TABS
20.0000 mg | ORAL_TABLET | Freq: Every day | ORAL | Status: DC
  Administered 2020-09-24 – 2020-09-27 (×4): 20 mg via ORAL
  Filled 2020-09-23 (×4): qty 2

## 2020-09-23 MED ORDER — GADOBUTROL 1 MMOL/ML IV SOLN
9.0000 mL | Freq: Once | INTRAVENOUS | Status: AC | PRN
  Administered 2020-09-23: 9 mL via INTRAVENOUS

## 2020-09-23 MED ORDER — METOPROLOL TARTRATE 50 MG PO TABS
50.0000 mg | ORAL_TABLET | Freq: Two times a day (BID) | ORAL | Status: DC
  Administered 2020-09-23 – 2020-09-27 (×9): 50 mg via ORAL
  Filled 2020-09-23 (×9): qty 1

## 2020-09-23 MED ORDER — MAGNESIUM SULFATE IN D5W 1-5 GM/100ML-% IV SOLN
1.0000 g | Freq: Once | INTRAVENOUS | Status: AC
  Administered 2020-09-23: 1 g via INTRAVENOUS
  Filled 2020-09-23: qty 100

## 2020-09-23 MED ORDER — SODIUM CHLORIDE 0.9 % IV SOLN: INTRAVENOUS | Status: DC

## 2020-09-23 MED ORDER — ORAL CARE MOUTH RINSE
15.0000 mL | Freq: Two times a day (BID) | OROMUCOSAL | Status: DC
  Administered 2020-09-23 – 2020-09-27 (×7): 15 mL via OROMUCOSAL

## 2020-09-23 NOTE — Evaluation (Signed)
Physical Therapy Evaluation Patient Details Name: Alexander Pierce MRN: [id number] DOB: [birth year] Today's Date: 09/23/2020   History of Present Illness  Pt is a 70 yo male admitted with fever, confusion, and aphasia.  Pt with EEG showing diffuse encephalopathy.  CT-.  MRI pending.   Clinical Impression  Pt presenting with significant lethargy, word finding difficulty, difficulty following simple commands consistently, difficulty initiating and sequencing task asked ie. "Sit up on EOB". Pt very unsteady during ambulation limiting to 10' which could very well be due to his extreme lethargy. Pt continuously trying to lay back down and "get a nap". Pt denies pain or dizziness stating "I"m just really tired.". HR ranging from 100s to 138bpm t/o session. BP and SPO2 stable.Suspect when pt more alert and less lethargic pt will progress well and be able to complete outpt follow up upon d/c once medically stable. Pt with good home set up and support, has a driver. Acute PT to cont to follow.    Follow Up Recommendations Outpatient PT;Supervision/Assistance - 24 hour    Equipment Recommendations   (TBD, suspect pt will much improved once alert and not so lethargic)    Recommendations for Other Services       Precautions / Restrictions Precautions Precautions: Fall Restrictions Weight Bearing Restrictions: No Other Position/Activity Restrictions: watch HR.  Pt up to 130s when walking to around in room      Mobility  Bed Mobility Overal bed mobility: Needs Assistance Bed Mobility: Sit to Supine;Supine to Sit     Supine to sit: Mod assist Sit to supine: Min guard   General bed mobility comments: max tactile cues and modA to bring LEs over EOB and roll to R side, modA to elevate trunk to sit EOB    Transfers Overall transfer level: Needs assistance Equipment used: 1 person hand held assist Transfers: Sit to/from Stand Sit to Stand: Min assist;+2 safety/equipment Stand pivot transfers: Min  assist       General transfer comment: minA to power up and steady, pt with wide base of support, lateral swaying  Ambulation/Gait Ambulation/Gait assistance: Min assist;+2 safety/equipment Gait Distance (Feet): 10 Feet (from one side of the bed to the other) Assistive device: 1 person hand held assist Gait Pattern/deviations: Step-to pattern;Decreased stride length;Wide base of support;Staggering left;Staggering right Gait velocity: slow   General Gait Details: pt very lethargic with significant lateral sway L/R, RN present to push IV pole to allow PT to have 2 hands on pt. pt with short step height and length, trunk/head flexed  Stairs            Wheelchair Mobility    Modified Rankin (Stroke Patients Only) Modified Rankin (Stroke Patients Only) Pre-Morbid Rankin Score: No symptoms Modified Rankin: Moderate disability     Balance Overall balance assessment: Needs assistance Sitting-balance support: Feet supported Sitting balance-Leahy Scale: Good     Standing balance support: During functional activity Standing balance-Leahy Scale: Fair Standing balance comment: pt requiring physical assist due to lethargy                             Pertinent Vitals/Pain Pain Assessment: No/denies pain    Home Living Family/patient expects to be discharged to:: Private residence Living Arrangements: Spouse/significant other Available Help at Discharge: Family Type of Home: House Home Access: Stairs to enter Entrance Stairs-Rails: None Entrance Stairs-Number of Steps: 3 Home Layout: One level Home Equipment: None Additional Comments: Pt fully independent PTA.  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     Hand Dominance   Dominant Hand: Right    Extremity/Trunk Assessment   Upper Extremity Assessment Upper Extremity Assessment: Defer  to OT evaluation RUE Deficits / Details: ROM WFL.  Pt lethargic during test so unsure of effort but RUE slightly weaker in testing than LUE although functional. RUE Sensation: WNL RUE Coordination: WNL    Lower Extremity Assessment Lower Extremity Assessment: Generalized weakness;Difficult to assess due to impaired cognition (couldn't keep pt on task to complete MMT)    Cervical / Trunk Assessment Cervical / Trunk Assessment: Normal  Communication   Communication: Expressive difficulties;Receptive difficulties  Cognition Arousal/Alertness: Lethargic Behavior During Therapy: Impulsive (kept trying to lay back down) Overall Cognitive Status: Impaired/Different from baseline Area of Impairment: Attention;Following commands;Safety/judgement;Awareness;Problem solving                   Current Attention Level: Focused   Following Commands: Follows one step commands inconsistently;Follows one step commands with increased time Safety/Judgement: Decreased awareness of safety;Decreased awareness of deficits Awareness: Emergent Problem Solving: Decreased initiation;Requires verbal cues;Requires tactile cues;Slow processing;Difficulty sequencing General Comments: pt inconsistent following of simple commands, approx 60% of time, pt requiring tactile cues to sequence  transfer to EOB as pt wasn't initiating with verbal, pt with noted word finding difficulty      General Comments General comments (skin integrity, edema, etc.): Pt's HR increased to 130s during amb in room, varied from 100s-131bpm at rest in bed, SpO2 >91% on RA, placed back on O2 as RN stated he needs it when he was sleeping    Exercises     Assessment/Plan    PT Assessment Patient needs continued PT services  PT Problem List Decreased strength;Decreased range of motion;Decreased activity tolerance;Decreased mobility;Decreased coordination;Decreased cognition;Decreased knowledge of use of DME;Decreased balance;Decreased  safety awareness       PT Treatment Interventions DME instruction;Gait training;Stair training;Functional mobility training;Therapeutic activities;Therapeutic exercise;Balance training;Neuromuscular re-education;Cognitive remediation;Patient/family education    PT Goals (Current goals can be found in the Care Plan section)  Acute Rehab PT Goals Patient Stated Goal: to feel normal PT Goal Formulation: With patient/family Time For Goal Achievement: 10/07/20 Potential to Achieve Goals: Good    Frequency Min 4X/week   Barriers to discharge        Co-evaluation               AM-PAC PT "6 Clicks" Mobility  Outcome Measure Help needed turning from your back to your side while in a flat bed without using bedrails?: A Little Help needed moving from lying on your back to sitting on the side of a flat bed without using bedrails?: A Little Help needed moving to and from a bed to a chair (including a wheelchair)?: A Little Help needed standing up from a chair using your arms (e.g., wheelchair or bedside chair)?: A Little Help needed to walk in hospital room?: A Lot Help needed climbing 3-5 steps with a railing? : A Lot 6 Click Score: 16    End of Session Equipment Utilized During Treatment: Gait belt Activity Tolerance: Patient limited by lethargy Patient left: in bed;with call bell/phone within reach;with bed alarm set;with family/visitor present Nurse Communication: Mobility status PT Visit Diagnosis: Other abnormalities of gait and mobility (R26.89);Muscle weakness (generalized) (M62.81);Difficulty in walking, not elsewhere classified (R26.2)  Time: 8315-1761 PT Time Calculation (min) (ACUTE ONLY): 24 min   Charges:   PT Evaluation $PT Eval Moderate Complexity: 1 Mod PT Treatments $Gait Training: 8-22 mins        Lewis Shock, PT, DPT Acute Rehabilitation Services Pager #: [phone] Office #: [phone]   Alexander Pierce 09/23/2020, 1:45 PM

## 2020-09-23 NOTE — Progress Notes (Signed)
EEG complete - results pending 

## 2020-09-23 NOTE — Procedures (Signed)
Patient Name: Alexander Pierce  MRN: [id number]  Epilepsy Attending: Charlsie Quest  Referring Physician/Provider: Dr Marvel Plan Date: 09/23/2020  Duration: 22.21 mins  Patient history: 70 y.o. male presenting with acute onset of expressive and receptive aphasia. EEG to evaluate for seizures.  Level of alertness: Awake, drowsy  AEDs during EEG study: None  Technical aspects: This EEG study was done with scalp electrodes positioned according to the 10-20 International system of electrode placement. Electrical activity was acquired at a sampling rate of 500Hz  and reviewed with a high frequency filter of 70Hz  and a low frequency filter of 1Hz . EEG data were recorded continuously and digitally stored.   Description: The posterior dominant rhythm consists of 7.5 Hz activity of moderate voltage (25-35 uV) seen predominantly in posterior head regions, symmetric and reactive to eye opening and eye closing. Drowsiness was characterized by attenuation of the posterior background rhythm. EEG showed intermittent generalized 3 to 6 Hz theta-delta slowing. Hyperventilation and photic stimulation were not performed.     ABNORMALITY - Intermittent slow, generalized  IMPRESSION: This study is suggestive of mild diffuse encephalopathy, nonspecific etiology. No seizures or epileptiform discharges were seen throughout the recording.  [name] 

## 2020-09-23 NOTE — Evaluation (Signed)
Occupational Therapy Evaluation Patient Details Name: Alexander Pierce MRN: [id number] DOB: Dec 08, 1950 Today's Date: 09/23/2020    History of Present Illness Pt is a 70 yo male admitted with fever, confusion, and aphasia.  Pt with EEG showing diffuse encephalopathy.  CT-.  MRI pending.   Clinical Impression   Pt admitted with the above diagnosis and has the deficits listed below. Pt would benefit from cont OT to increase independence with basic adls and cognition so pt can dc home with his wife. 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 Pt very lethargic during eval but at this point feel if lethargy clears and cognition improves, pt will be able to d/c home with OP services.  Will continue to update d/c plan if this changes.      Follow Up Recommendations  Outpatient OT;Supervision/Assistance - 24 hour    Equipment Recommendations  Other (comment) (tbd)    Recommendations for Other Services       Precautions / Restrictions Precautions Precautions: Fall Restrictions Weight Bearing Restrictions: No Other Position/Activity Restrictions: watch HR.  Pt up to 160s when walking to bathroom.      Mobility Bed Mobility Overal bed mobility: Needs Assistance Bed Mobility: Sit to Supine;Supine to Sit     Supine to sit: Min assist Sit to supine: Min guard   General bed mobility comments: Pt used bedrail and required cues for safety    Transfers Overall transfer level: Needs assistance Equipment used: 1 person hand held assist Transfers: Stand Pivot Transfers;Sit to/from Stand Sit to Stand: Min guard Stand pivot transfers: Min assist       General transfer comment: Pt with many lines and pt impulsive needing to go to the bathroom.    Balance Overall balance assessment: Needs assistance Sitting-balance support: Feet supported Sitting balance-Leahy Scale: Good     Standing balance support: During functional activity Standing  balance-Leahy Scale: Fair Standing balance comment: Pt able to stand unassisted physically for a few moments but requires outside assist due to lethargy                           ADL either performed or assessed with clinical judgement   ADL Overall ADL's : Needs assistance/impaired Eating/Feeding: Sitting;Set up   Grooming: Wash/dry hands;Wash/dry face;Oral care;Minimal assistance;Sitting;Bed level Grooming Details (indicate cue type and reason): Pt lethargic so full evaluation difficult. Upper Body Bathing: Minimal assistance;Sitting Upper Body Bathing Details (indicate cue type and reason): Pt requiring cues for initation.  Would get started and stop task. Lower Body Bathing: Moderate assistance;Sit to/from stand;Cueing for sequencing   Upper Body Dressing : Minimal assistance;Sitting Upper Body Dressing Details (indicate cue type and reason): gown requring assist. pt unware of all the lines and wanting to donn gown as if lines not there. Lower Body Dressing: Moderate assistance;Sit to/from stand;Cueing for compensatory techniques Lower Body Dressing Details (indicate cue type and reason): Pt unsteady in standing and unable to keep eyes open for very long. Toilet Transfer: Minimal assistance;Stand-pivot;BSC   Toileting- Clothing Manipulation and Hygiene: Minimal assistance;Sit to/from stand       Functional mobility during ADLs: Minimal assistance General ADL Comments: Pt limited due to lethargy.  Difficult to see how cognition will play into deficits at this point.  Will eval further when pt more awake.     Vision Baseline Vision/History: 0 No visual deficits Ability to See in Adequate Light: 0  Adequate Patient Visual Report: No change from baseline Vision Assessment?: No apparent visual deficits (needs further screening.  pt eyes closed much of session)     Perception Perception Perception Tested?: No   Praxis Praxis Praxis tested?: Not tested    Pertinent  Vitals/Pain Pain Assessment: No/denies pain     Hand Dominance Right   Extremity/Trunk Assessment Upper Extremity Assessment Upper Extremity Assessment: RUE deficits/detail RUE Deficits / Details: ROM WFL.  Pt lethargic during test so unsure of effort but RUE slightly weaker in testing than LUE although functional. RUE Sensation: WNL RUE Coordination: WNL   Lower Extremity Assessment Lower Extremity Assessment: Defer to PT evaluation   Cervical / Trunk Assessment Cervical / Trunk Assessment: Normal   Communication Communication Communication: Expressive difficulties;Receptive difficulties   Cognition Arousal/Alertness: Lethargic Behavior During Therapy: Impulsive Overall Cognitive Status: Impaired/Different from baseline Area of Impairment: Attention;Memory;Following commands;Safety/judgement;Awareness;Problem solving                   Current Attention Level: Focused   Following Commands: Follows one step commands inconsistently Safety/Judgement: Decreased awareness of safety;Decreased awareness of deficits Awareness: Intellectual Problem Solving: Decreased initiation;Requires verbal cues;Requires tactile cues General Comments: Pt inconsistent following simple commands. Impulsive with movement and could not understand why we needed to use [facility] when he had bathroom urgency issues.  Wanted to walk to bathroom but couldnt make it in time.   General Comments  Pt limited due to cognition, insight, mild aphasia and lethargy.    Exercises     Shoulder Instructions      Home Living Family/patient expects to be discharged to:: Private residence Living Arrangements: Spouse/significant other Available Help at Discharge: Family Type of Home: House Home Access: Stairs to enter Secretary/administrator of Steps: 3 Entrance Stairs-Rails: None Home Layout: One level     Bathroom Shower/Tub: Producer, television/film/video: Standard     Home Equipment: None    Additional Comments: Pt fully independent PTA.      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        OT Problem List: Decreased activity tolerance;Impaired balance (sitting and/or standing);Decreased cognition;Decreased safety awareness;Decreased knowledge of use of DME or AE      OT Treatment/Interventions: Self-care/ADL training;Therapeutic activities;Balance training    OT Goals(Current goals can be found in the care plan section) Acute Rehab OT Goals Patient Stated Goal: to feel normal OT Goal Formulation: With patient/family Time For Goal Achievement: 10/07/20 Potential to Achieve Goals: Good ADL Goals Pt Will Perform Eating: Independently;sitting Pt Will Perform Grooming: with supervision;standing Pt Will Perform Tub/Shower Transfer: Shower transfer;with supervision;ambulating Additional ADL Goal #1: Pt will walk to bathroom and complete all toileting with mod I. Additional ADL Goal #2: Pt will follow 3 step commands during adls with 100% accuracy.  OT Frequency: Min 2X/week   Barriers to D/C:    lives with wife       Co-evaluation              AM-PAC OT "6 Clicks" Daily Activity     Outcome Measure Help from another person eating meals?: A Little Help from another person taking care of personal grooming?: A Little Help from another person toileting, which includes using toliet, bedpan, or urinal?: A Little Help from another person bathing (including washing, rinsing, drying)?: A Little Help from another  person to put on and taking off regular upper body clothing?: A Little Help from another person to put on and taking off regular lower body clothing?: A Little 6 Click Score: 18   End of Session Nurse Communication: Mobility status  Activity Tolerance: Patient limited by lethargy Patient left: in bed;with call bell/phone within  reach;Other (comment) (ECHO after therapy)  OT Visit Diagnosis: Unsteadiness on feet (R26.81);Other symptoms and signs involving the nervous system (R29.898);Other symptoms and signs involving cognitive function                Time: 5638-9373 OT Time Calculation (min): 36 min Charges:  OT General Charges $OT Visit: 1 Visit OT Evaluation $OT Eval Moderate Complexity: 1 Mod OT Treatments $Self Care/Home Management : 8-22 mins  Hope Budds 09/23/2020, 12:37 PM

## 2020-09-23 NOTE — Progress Notes (Signed)
Spoke with Dr. Wilford Corner over the phone about orders for 3% and NS.  MD wants pt to continue 3% and not receive NS.  NS discontinued.

## 2020-09-23 NOTE — Progress Notes (Signed)
STROKE TEAM PROGRESS NOTE   SUBJECTIVE (INTERVAL HISTORY) His wife and a male friend are at the bedside. Pt lying in bed, having LE venous doppler done. He is afebrile now and neuro intact, no aphasia or confusion.   Per wife and pt, last Friday he started to have fever and mild chills, he did not know why. No cough or N/V. Yesterday he continues to have fever but had sudden onset aphasia for which he was admitted and s/p TNK. Once fever improved, his speech also improved. He was also found to have profound hyponatremia which was new to him. He denies any shaking jerking or seizure like activity. Per RN, he did have diarrhea this am.   On tele he had intermittent afib and now persistent, captured on EKG. MRI pending in the evening.   In 04/2020 he had vertigo episode while having shower, was admitted and had work up with CT, CTA head and neck and MRI also negative. No vertigo since    OBJECTIVE Temp:  [98.9 F (37.2 C)-102.9 F (39.4 C)] 100.1 F (37.8 C) (09/06 2000) Pulse Rate:  [79-118] 97 (09/06 2000) Resp:  [14-33] 24 (09/06 2000) BP: (110-164)/(61-111) 128/61 (09/06 2000) SpO2:  [91 %-97 %] 92 % (09/06 2000)  Recent Labs  Lab 09/23/20 0149 09/23/20 0331 09/23/20 0759 09/23/20 1152 09/23/20 1939  GLUCAP 103* 119* 111* 106* 107*   Recent Labs  Lab 09/22/20 1800 09/22/20 1809 09/22/20 2044 09/22/20 2157 09/23/20 0143 09/23/20 0332 09/23/20 0533 09/23/20 0827 09/23/20 0947 09/23/20 1530  NA 117* 118* 118* 122*   < > 120* 122* 123* 123* 124*  K 4.1 3.7 3.3*  --   --  3.5  --   --   --   --   CL 86* 85* 87*  --   --  90*  --   --   --   --   CO2 20*  --  22  --   --  21*  --   --   --   --   GLUCOSE 124* 127* 122*  --   --  118*  --   --   --   --   BUN 11 13 10   --   --  12  --   --   --   --   CREATININE 0.97 0.90 0.93  --   --  0.97  --   --   --   --   CALCIUM 8.2*  --  7.7*  --   --  7.7*  --   --   --   --   MG  --   --   --  1.4*  --   --   --  1.9  --   --    PHOS  --   --   --   --   --   --   --  2.1*  --   --    < > = values in this interval not displayed.   Recent Labs  Lab 09/22/20 1800  AST 34  ALT 24  ALKPHOS 64  BILITOT 1.0  PROT 7.2  ALBUMIN 3.2*   Recent Labs  Lab 09/22/20 1800 09/22/20 1809 09/23/20 0332  WBC 9.9  --  8.2  NEUTROABS 8.6*  --   --   HGB 15.5 15.6 14.1  HCT 43.5 46.0 38.1*  MCV 86.8  --  83.2  PLT 216  --  199  No results for input(s): CKTOTAL, CKMB, CKMBINDEX, TROPONINI in the last 168 hours. Recent Labs    09/22/20 1800  LABPROT 16.3*  INR 1.3*   Recent Labs    09/22/20 2013 09/22/20 2146  COLORURINE YELLOW YELLOW  LABSPEC >1.046* >1.046*  PHURINE 5.0 5.0  GLUCOSEU NEGATIVE NEGATIVE  HGBUR MODERATE* MODERATE*  BILIRUBINUR NEGATIVE NEGATIVE  KETONESUR 20* 20*  PROTEINUR 100* 100*  NITRITE NEGATIVE NEGATIVE  LEUKOCYTESUR NEGATIVE NEGATIVE       Component Value Date/Time   CHOL 149 09/23/2020 0143   TRIG 53 09/23/2020 0143   HDL 42 09/23/2020 0143   CHOLHDL 3.5 09/23/2020 0143   VLDL 11 09/23/2020 0143   LDLCALC 96 09/23/2020 0143   Lab Results  Component Value Date   HGBA1C 5.9 (H) 09/23/2020   No results found for: LABOPIA, COCAINSCRNUR, LABBENZ, AMPHETMU, THCU, LABBARB  Recent Labs  Lab 09/22/20 2147  ETH <10    I have personally reviewed the radiological images below and agree with the radiology interpretations.  CT HEAD WO CONTRAST ( )  Result Date: 09/22/2020 CLINICAL DATA:  Follow-up examination for acute stroke, waxing and waning dysarthria, right-sided weakness. EXAM: CT HEAD WITHOUT CONTRAST TECHNIQUE: Contiguous axial images were obtained from the base of the skull through the vertex without intravenous contrast. COMPARISON:  CT from earlier the same day. FINDINGS: Brain: Cerebral volume within normal limits. No acute intracranial hemorrhage. No visible acute large vessel territory infarct. No mass lesion, midline shift or mass effect. No hydrocephalus or  extra-axial fluid collection. Vascular: No asymmetric hyperdense vessel. Skull: Scalp soft tissues and calvarium within normal limits. Sinuses/Orbits: Globes and orbital soft tissues demonstrate no acute finding. Mild scattered mucosal thickening noted throughout the paranasal sinuses. Mastoid air cells remain clear. Other: None. IMPRESSION: Stable head CT.  No acute intracranial abnormality identified. Electronically Signed   By: Rise Mu M.D.   On: 09/22/2020 22:18   MR BRAIN W WO CONTRAST  Result Date: 09/23/2020 CLINICAL DATA:  Stroke follow-up.  Aphasia. EXAM: MRI HEAD WITHOUT AND WITH CONTRAST TECHNIQUE: Multiplanar, multiecho pulse sequences of the brain and surrounding structures were obtained without and with intravenous contrast. CONTRAST:  73mL GADAVIST GADOBUTROL 1 MMOL/ML IV SOLN COMPARISON:  Head CT 09/22/2020 and MRI 04/19/2020 FINDINGS: The study is mildly motion degraded. Brain: There is no evidence of an acute infarct, intracranial hemorrhage, mass, midline shift, or extra-axial fluid collection. Small T2 hyperintensities in the cerebral white matter and pons are more conspicuous than on the prior MRI and are nonspecific but compatible with mild chronic small vessel ischemic disease. Mild cerebral atrophy is within normal limits for age. No abnormal enhancement is identified. Vascular: Major intracranial vascular flow voids are preserved. Skull and upper cervical spine: Unremarkable bone marrow signal. Sinuses/Orbits: Unremarkable orbits. Small mucous retention cyst in the right maxillary sinus. Clear mastoid air cells. Other: None. IMPRESSION: 1. No acute intracranial abnormality. 2. Mild chronic small vessel ischemic disease. Electronically Signed   By: Sebastian Ache M.D.   On: 09/23/2020 19:10   DG Chest Port 1 View  Result Date: 09/22/2020 CLINICAL DATA:  Questionable sepsis EXAM: PORTABLE CHEST 1 VIEW COMPARISON:  06/03/2020 FINDINGS: Cardiomegaly. Mild vascular congestion.  Bibasilar airspace opacities, bibasilar airspace opacities. No effusions or overt edema. No acute bony abnormality. IMPRESSION: Cardiomegaly, vascular congestion. Bibasilar atelectasis or infiltrates. Electronically Signed   By: Charlett Nose M.D.   On: 09/22/2020 19:19   EEG adult  Result Date: 09/23/2020 Charlsie Quest, MD  09/23/2020 10:27 AM Patient Name: Alexander Pierce MRN: [id number] Epilepsy Attending: Charlsie Quest Referring Physician/Provider: Dr Marvel Plan Date: 09/23/2020 Duration: 22.21 mins Patient history: 70 y.o. male presenting with acute onset of expressive and receptive aphasia. EEG to evaluate for seizures. Level of alertness: Awake, drowsy AEDs during EEG study: None Technical aspects: This EEG study was done with scalp electrodes positioned according to the 10-20 International system of electrode placement. Electrical activity was acquired at a sampling rate of  and reviewed with a high frequency filter of  and a low frequency filter of . EEG data were recorded continuously and digitally stored. Description: The posterior dominant rhythm consists of 7.5 Hz activity of moderate voltage (25-35 uV) seen predominantly in posterior head regions, symmetric and reactive to eye opening and eye closing. Drowsiness was characterized by attenuation of the posterior background rhythm. EEG showed intermittent generalized 3 to 6 Hz theta-delta slowing. Hyperventilation and photic stimulation were not performed.   ABNORMALITY - Intermittent slow, generalized IMPRESSION: This study is suggestive of mild diffuse encephalopathy, nonspecific etiology. No seizures or epileptiform discharges were seen throughout the recording. Charlsie Quest   ECHOCARDIOGRAM COMPLETE  Result Date: 09/23/2020    ECHOCARDIOGRAM REPORT   Patient Name:   Alexander Pierce Date of Exam: 09/23/2020 Medical Rec #:  [id number]     Height:       74.0 in Accession #:    [id number]    Weight:       212.1 lb Date of Birth:   Oct 19, 1950     BSA:          2.228 m Patient Age:    69 years      BP:           138/83 mmHg Patient Gender: M             HR:           100 bpm. Exam Location:  Inpatient Procedure: 2D Echo, Cardiac Doppler and Color Doppler REPORT CONTAINS CRITICAL RESULT  Reported to: Dr Delane Ginger. Indications:    Fever  History:        Patient has no prior history of Echocardiogram examinations.                 Risk Factors:Dyslipidemia.  Sonographer:    Neomia Dear RDCS Referring Phys: [id number] LAURA R GLEASON IMPRESSIONS  1. Left ventricular ejection fraction, by estimation, is 45 to 50%. The left ventricle has mildly decreased function. The left ventricle has no regional wall motion abnormalities. Left ventricular diastolic function could not be evaluated.  2. Right ventricular systolic function is normal. The right ventricular size is normal. There is normal pulmonary artery systolic pressure.  3. The mitral valve is grossly normal. Trivial mitral valve regurgitation.  4. 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 The aortic valve is calcified. Aortic valve regurgitation is trivial. Mild aortic valve stenosis.  5. Aortic dilatation noted. 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 FINDINGS  Left Ventricle: Left ventricular ejection fraction, by estimation, is 45 to 50%. The left ventricle has mildly decreased function. The left ventricle has no regional wall motion abnormalities. The left ventricular internal cavity size was normal in size. There is no left ventricular hypertrophy. Left ventricular diastolic function could not be evaluated due to atrial fibrillation. Left ventricular diastolic function could not be evaluated. Right Ventricle: The right ventricular size is normal. Right vetricular wall thickness was not  well visualized. Right ventricular systolic function is normal. There is normal pulmonary artery systolic pressure. The tricuspid regurgitant velocity is 2.55 m/s, and with an assumed  right atrial pressure of 3 mmHg, the estimated right ventricular systolic pressure is 29.0 mmHg. Left Atrium: Left atrial size was normal in size. Right Atrium: Right atrial size was normal in size. Pericardium: There is no evidence of pericardial effusion. Mitral Valve: The mitral valve is grossly normal. Trivial mitral valve regurgitation. Tricuspid Valve: The tricuspid valve is grossly normal. Tricuspid valve regurgitation is trivial. 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 The aortic valve is calcified. Aortic valve regurgitation is trivial. Aortic regurgitation PHT measures 345 msec. Mild aortic stenosis is present. Aortic valve mean gradient measures 13.7 mmHg. Aortic valve peak gradient measures 23.7 mmHg. Aortic valve area, by VTI measures 2.24 cm. Pulmonic Valve: The pulmonic valve was normal in structure. Pulmonic valve regurgitation is not visualized. Aorta: Aortic dilatation noted. 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 IAS/Shunts: The atrial septum is grossly normal.  LEFT VENTRICLE PLAX 2D LVIDd:         5.10 cm LVIDs:         3.80 cm LV PW:         1.10 cm LV IVS:        1.30 cm LVOT diam:     2.60 cm LV SV:         75 LV SV Index:   34 LVOT Area:     5.31 cm  LV Volumes (MOD) LV vol d, MOD A2C: 68.0 ml LV vol d, MOD A4C: 108.0 ml LV vol s, MOD A2C: 49.1 ml LV vol s, MOD A4C: 55.2 ml LV SV MOD A2C:     18.9 ml LV SV MOD A4C:     108.0 ml LV SV MOD BP:      32.7 ml RIGHT VENTRICLE RV Basal diam:  2.10 cm RV Mid diam:    1.20 cm RV S prime:     12.90 cm/s TAPSE (M-mode): 2.2 cm LEFT ATRIUM             Index       RIGHT ATRIUM           Index LA Vol (A2C):   53.3 ml 23.92 ml/m RA Area:     15.40 cm LA Vol (A4C):   51.7 ml 23.20 ml/m RA Volume:   33.30 ml  14.94 ml/m LA Biplane Vol: 52.6 ml 23.61 ml/m  AORTIC VALVE                    PULMONIC VALVE AV Area (Vmax):    1.98 cm     PV Vmax:       1.05 m/s AV Area (Vmean):   1.91 cm     PV Vmean:      67.300  cm/s AV Area (VTI):     2.24 cm     PV VTI:        0.142 m AV Vmax:           243.33 cm/s  PV Peak grad:  4.4 mmHg AV Vmean:          170.000 cm/s PV Mean grad:  2.0 mmHg AV VTI:            0.334 m AV Peak Grad:      23.7 mmHg AV Mean Grad:      13.7 mmHg  LVOT Vmax:         90.70 cm/s LVOT Vmean:        61.100 cm/s LVOT VTI:          0.141 m LVOT/AV VTI ratio: 0.42 AI PHT:            345 msec  AORTA Ao Root diam: 3.70 cm Ao Asc diam:  5.40 cm MR Peak grad: 109.4 mmHg  TRICUSPID VALVE MR Mean grad: 76.0 mmHg   TR Peak grad:   26.0 mmHg MR Vmax:      523.00 cm/s TR Vmax:        255.00 cm/s MR Vmean:     408.0 cm/s                           SHUNTS                           Systemic VTI:  0.14 m                           Systemic Diam: 2.60 cm Kristeen Miss MD Electronically signed by Kristeen Miss MD Signature Date/Time: 09/23/2020/1:03:36 PM    Final    CT HEAD CODE STROKE WO CONTRAST  Result Date: 09/22/2020 CLINICAL DATA:  Code stroke. Neuro deficit, acute, stroke suspected. Additional history provided: Last known well 1430, aphasia, LVO score 4. EXAM: CT HEAD WITHOUT CONTRAST TECHNIQUE: Contiguous axial images were obtained from the base of the skull through the vertex without intravenous contrast. COMPARISON:  Brain MRI 04/19/2020. Noncontrast head CT 04/19/2020. CT angiogram head/neck 04/19/2020. FINDINGS: Brain: Cerebral volume is normal. There is no acute intracranial hemorrhage. No demarcated cortical infarct. No extra-axial fluid collection. No evidence of an intracranial mass. No midline shift. Vascular: No hyperdense vessel. Skull: Normal. Negative for fracture or focal lesion. Sinuses/Orbits: Visualized orbits show no acute finding. Postsurgical appearance of the paranasal sinuses. Mild scattered paranasal sinus mucosal thickening at the imaged levels. Small bilateral maxillary sinus mucous retention cysts. ASPECTS ([place] Stroke Program Early CT Score) - Ganglionic level infarction (caudate, lentiform  nuclei, internal capsule, insula, M1-M3 cortex): 7 - Supraganglionic infarction (M4-M6 cortex): 3 Total score (0-10 with 10 being normal): 10 These results were communicated to Dr. Otelia Limes at [facility] pmon 9/5/2022by text page via the [facility] messaging system. IMPRESSION: No evidence of acute intracranial abnormality. Paranasal sinus disease, as described. Electronically Signed   By: Jackey Loge D.O.   On: 09/22/2020 18:12   VAS [place] LOWER EXTREMITY VENOUS (DVT)  Result Date: 09/23/2020  Lower Venous DVT Study Patient Name:  Alexander Pierce  Date of Exam:   09/23/2020 Medical Rec #: [id number]      Accession #:    [id number] Date of Birth: 07-25-1950      Patient Gender: M Patient Age:   51 years Exam Location:  [facility] Procedure:      VAS [place] LOWER EXTREMITY VENOUS (DVT) Referring Phys: Scheryl Marten Eiley Mcginnity --------------------------------------------------------------------------------  Indications: Stroke.  Comparison Study: No prior studies. Performing Technologist: Jean Rosenthal RDMS, RVT  Examination Guidelines: A complete evaluation includes B-mode imaging, spectral Doppler, color Doppler, and power Doppler as needed of all accessible portions of each vessel. Bilateral testing is considered an integral part of a complete examination. Limited examinations for reoccurring indications may be performed as noted. The reflux portion of the exam is performed with the  patient in reverse Trendelenburg.  +---------+---------------+---------+-----------+----------+--------------+ RIGHT    CompressibilityPhasicitySpontaneityPropertiesThrombus Aging +---------+---------------+---------+-----------+----------+--------------+ CFV      Full           Yes      Yes                                 +---------+---------------+---------+-----------+----------+--------------+ SFJ      Full                                                        +---------+---------------+---------+-----------+----------+--------------+  FV Prox  Full                                                        +---------+---------------+---------+-----------+----------+--------------+ FV Mid   Full                                                        +---------+---------------+---------+-----------+----------+--------------+ FV DistalFull                                                        +---------+---------------+---------+-----------+----------+--------------+ PFV      Full                                                        +---------+---------------+---------+-----------+----------+--------------+ POP      Full           Yes      Yes                                 +---------+---------------+---------+-----------+----------+--------------+ PTV      Full                                                        +---------+---------------+---------+-----------+----------+--------------+ PERO     Full                                                        +---------+---------------+---------+-----------+----------+--------------+ Gastroc  Full                                                        +---------+---------------+---------+-----------+----------+--------------+   +---------+---------------+---------+-----------+----------+--------------+  LEFT     CompressibilityPhasicitySpontaneityPropertiesThrombus Aging +---------+---------------+---------+-----------+----------+--------------+ CFV      Full           Yes      Yes                                 +---------+---------------+---------+-----------+----------+--------------+ SFJ      Full                                                        +---------+---------------+---------+-----------+----------+--------------+ FV Prox  Full                                                        +---------+---------------+---------+-----------+----------+--------------+ FV Mid   Full                                                         +---------+---------------+---------+-----------+----------+--------------+ FV DistalFull                                                        +---------+---------------+---------+-----------+----------+--------------+ PFV      Full                                                        +---------+---------------+---------+-----------+----------+--------------+ POP      Full           Yes      Yes                                 +---------+---------------+---------+-----------+----------+--------------+ PTV      Full                                                        +---------+---------------+---------+-----------+----------+--------------+ PERO     Full                                                        +---------+---------------+---------+-----------+----------+--------------+ Gastroc  Full                                                        +---------+---------------+---------+-----------+----------+--------------+  Summary: RIGHT: - There is no evidence of deep vein thrombosis in the lower extremity.  - No cystic structure found in the popliteal fossa.  LEFT: - There is no evidence of deep vein thrombosis in the lower extremity.  - No cystic structure found in the popliteal fossa.  *See table(s) above for measurements and observations. Electronically signed by Coral Else MD on 09/23/2020 at 7:17:39 PM.    Final    CT ANGIO HEAD NECK W WO CM W PERF (CODE STROKE)  Result Date: 09/22/2020 CLINICAL DATA:  Neuro deficit, acute, stroke suspected. EXAM: CT ANGIOGRAPHY HEAD AND NECK CT PERFUSION BRAIN TECHNIQUE: Multidetector CT imaging of the head and neck was performed using the standard protocol during bolus administration of intravenous contrast. Multiplanar CT image reconstructions and MIPs were obtained to evaluate the vascular anatomy. Carotid stenosis measurements (when applicable) are obtained utilizing NASCET criteria,  using the distal internal carotid diameter as the denominator. Multiphase CT imaging of the brain was performed following IV bolus contrast injection. Subsequent parametric perfusion maps were calculated using RAPID software. CONTRAST:  Administered contrast not known at this time. COMPARISON:  Noncontrast head CT performed earlier today 09/22/2020. Brain MRI 04/19/2020. CT angiogram head/neck 04/19/2020. FINDINGS: CTA NECK FINDINGS Aortic arch: Standard aortic branching. Atherosclerotic plaque within the visualized aortic arch. Streak artifact from a dense left-sided contrast bolus partially obscures the left subclavian artery. Within this limitation, there is no appreciable hemodynamically significant stenosis within the innominate or proximal subclavian arteries. Right carotid system: CCA and ICA patent within the neck without stenosis. Minimal atherosclerotic plaque within the carotid bifurcation. Left carotid system: CCA and ICA patent within the neck without stenosis. Mild soft and calcified plaque within the carotid bifurcation and proximal ICA. Vertebral arteries: Beam hardening artifact from a dense left-sided contrast bolus partially obscures the proximal left vertebral artery. Within this limitation, the vertebral arteries are codominant and patent within the neck without stenosis. Skeleton: Cervical spondylosis. Fusion across the C5-C6 disc space with associated left-sided facet joint ankylosis at this level. No acute bony abnormality or aggressive osseous lesion. Other neck: No neck mass or cervical lymphadenopathy. Upper chest: No consolidation within the imaged lung apices. Review of the MIP images confirms the above findings CTA HEAD FINDINGS Anterior circulation: The intracranial internal carotid arteries are patent. No M2 proximal branch occlusion or high-grade proximal stenosis is identified. The anterior cerebral arteries are patent. No intracranial aneurysm is identified. Posterior circulation:  The intracranial vertebral arteries are patent. The basilar artery is patent. The posterior cerebral arteries are patent. The posterior cerebral arteries are patent. Posterior communicating arteries are hypoplastic or absent bilaterally. Venous sinuses: Within the limitations of contrast timing, no convincing thrombus. Anatomic variants: As described Review of the MIP images confirms the above findings CT Brain Perfusion Findings: The CT perfusion post processing failed. In reviewing the source imaging, this is likely due to patient motion. No large vessel occlusion identified. These results were called by telephone at the time of interpretation on 09/22/2020 at 6:40 pm to provider [facility] , who verbally acknowledged these results. IMPRESSION: CTA neck: Streak and beam hardening artifact arising from a dense left-sided contrast bolus partially obscures the proximal left vertebral artery. Within this limitation, the common carotid, internal carotid and vertebral arteries are patent within the neck without stenosis. Mild atherosclerotic plaque within the bilateral carotid systems within the neck, as described. CTA head: No intracranial large vessel occlusion or proximal high-grade arterial stenosis identified. CT perfusion head: Failed CT  perfusion, likely due to patient motion. Electronically Signed   By: Jackey Loge D.O.   On: 09/22/2020 18:51     PHYSICAL EXAM  Temp:  [98.9 F (37.2 C)-102.9 F (39.4 C)] 100.1 F (37.8 C) (09/06 2000) Pulse Rate:  [79-118] 97 (09/06 2000) Resp:  [14-33] 24 (09/06 2000) BP: (110-164)/(61-111) 128/61 (09/06 2000) SpO2:  [91 %-97 %] 92 % (09/06 2000)  General - Well nourished, well developed, in no apparent distress.  Ophthalmologic - fundi not visualized due to noncooperation.  Cardiovascular - Regular rhythm and rate.  Mental Status -  Level of arousal and orientation to time, place, and person were intact. Language including expression, naming, repetition,  comprehension was assessed and found intact. Fund of Knowledge was assessed and was intact.  Cranial Nerves II - XII - II - Visual field intact OU. III, IV, VI - Extraocular movements intact. V - Facial sensation intact bilaterally. VII - Facial movement intact bilaterally. VIII - Hearing & vestibular intact bilaterally. X - Palate elevates symmetrically. XI - Chin turning & shoulder shrug intact bilaterally. XII - Tongue protrusion intact.  Motor Strength - The patient's strength was normal in all extremities and pronator drift was absent.  Bulk was normal and fasciculations were absent.   Motor Tone - Muscle tone was assessed at the neck and appendages and was normal.  Reflexes - The patient's reflexes were symmetrical in all extremities and he had no pathological reflexes.  Sensory - Light touch, temperature/pinprick were assessed and were symmetrical.    Coordination - The patient had normal movements in the hands and feet with no ataxia or dysmetria.  Tremor was absent.  Gait and Station - deferred.   ASSESSMENT/PLAN Alexander Pierce is a 70 y.o. male with history of HLD, vertigo episode in 04/2020 admitted for aphasia, confusion and fever.  Status post TNK.  TIA vs. Seizure 2/2 hyponatremia vs. Encephalopathy 2/2 fever vs. encephalitis CT head negative CTA head neck negative LE venous Doppler negative for DVT MRI with and without contrast pending 2D Echo EF 45 to 50% EEG mild diffuse encephalopathy, no seizure LDL 96 HgbA1c 5.9 SCDs for VTE prophylaxis No antithrombotic prior to admission, now on No antithrombotic within 24 hours or up tPA. Ongoing aggressive stroke risk factor management Therapy recommendations: Outpatient PT OT Disposition: Pending  Fever, unclear origin T-max 103-> afebrile->100.1 Blood culture pending Urine culture pending UA WBC 6-10 CXR bibasilar atelectasis or infiltrates COVID PCR negative Given significant neuro improvement, less  likely encephalitis, will hold off LP  Given significant improvement, negative 2D echo, no elective food of endocarditis, will hold off TEE unless blood culture positive Continue cefepime and vancomycin  Hyponatremia Na 117->118->122->123->124 In 04/2020 Na 132 On 3% saline EEG no seizure Continue 3% saline, no more than 10 mEq increase within 24h to avoid CPM  PAF Intermittent A. fib on telemetry-> persistent now Captured on EKG Need to consider DOAC if MRI no large infarct post 24 hours tPA  Hypertension Stable BP goal less than 180/105 after TNK Long term BP goal normotensive  Hyperlipidemia Home meds: None LDL 96, goal < 70 Now on Lipitor 20 Continue statin at discharge  Other Stroke Risk Factors Advanced age  Other Active Problems Hypokalemia K 3.3 -> supplement  Hospital day # 1  This patient is critically ill due to strokelike symptoms status post TNK, fever, profound hyponatremia, PAF and at significant risk of neurological worsening, death form recurrent stroke, hemorrhagic conversion, bleeding from thrombolytics,  heart failure, seizure, sepsis. This patient's care requires constant monitoring of vital signs, hemodynamics, respiratory and cardiac monitoring, review of multiple databases, neurological assessment, discussion with family, other specialists and medical decision making of high complexity. I spent 45 minutes of neurocritical care time in the care of this patient. I had long discussion with wife and patient at bedside, updated pt current condition, treatment plan and potential prognosis, and answered all the questions.  They expressed understanding and appreciation.      Marvel PlanJindong Kiegan Macaraeg, MD PhD Stroke Neurology 09/23/2020 9:05 PM    To contact Stroke Continuity provider, please refer to [email or web address]. After hours, contact General Neurology

## 2020-09-23 NOTE — Progress Notes (Signed)
eLink Physician-Brief Progress Note Patient Name: Alexander Pierce DOB: 1950/11/22 MRN: [id number]   Date of Service  09/23/2020  HPI/Events of Note  Patient admitted with hyponatremia and metabolic encephalopathy, he has been attempting to get out of bed, putting him at risk for falling.  eICU Interventions  Posy soft restraint ordered for safety.        Thomasene Lot Tamber Burtch 09/23/2020, 7:45 PM

## 2020-09-23 NOTE — Progress Notes (Signed)
eLink Physician-Brief Progress Note Patient Name: Alexander Pierce DOB: Sep 17, 1950 MRN: [id number]   Date of Service  09/23/2020  HPI/Events of Note  43M with quick onset of neuologic symptoms in setting of a fever and Na 117.  Received tPA with no obvious complications yet noted.    Started on 3%NS  eICU Interventions  Latest Na was 122.  Goal is 123 until tomorrow evening.  No further 3% needed after current bag finishes infusion.       Intervention Category Evaluation Type: New Patient Evaluation  Jacinta Shoe 09/23/2020, 12:00 AM

## 2020-09-23 NOTE — Progress Notes (Signed)
NAME:  Alexander Pierce, MRN:  [id number], DOB:  January 24, 1950, LOS: 1 ADMISSION DATE:  09/22/2020, CONSULTATION DATE:  09/23/20 REFERRING MD:  Wilford Corner, CHIEF COMPLAINT:  confusion   History of Present Illness:  Alexander Pierce is a 70 y.o. M with PMH of allergic rhinitis, HL not on medication and osteoarthritis who presented to the ED with fever, confusion and slurred speech.  There was concern for R-sided facial droop and weakness and code stroke was called and patient received TPA.   CT and CTA head did not reveal acute stroke, hemorrhage or LVO.    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   He gave a speech on 9/3 and later that day told his wife that he did not feel well, he mostly stayed in bed and ate very little.  He developed a fever to 101F without coughing, URI sx's, urinary symptoms, abdominal pain or significant diarrhea.   On 9/5 his wife noted confusion and difficulty speaking so was brought to the ED.  No known recent infectious exposures, travelled to the [place] approximately one month ago, had a tick bite per prior notes several months ago, RMSF negative, but none recently and no rashes.    Work-up in the ED was significant for Na of 117, no leukocytosis or lactic acidosis, CXR with atelectasis and possible basal infiltrates, normal renal function, UA with ketones and high specific gravity.   Wife denies any ETOH use, no diuretics at home.  Prior labs show mild hyponatremia 130-132.   Pertinent  Medical History   has a past medical history of ALLERGIC RHINITIS (06/29/2007), Allergy, HYPERLIPIDEMIA (06/29/2007), and Right inguinal hernia (06/24/2011).   Significant Hospital Events: Including procedures, antibiotic start and stop dates in addition to other pertinent events   9/5 Presented to ED as code stroke, received TPA, concern for infection  Interim History /  Subjective:  Tmax 103 , afebrile currently. PTA had poor appetite and hadn't been eating much for several days. No SOB or coughing. No urinary symptoms. Doesn't wear O2 at home. Episode of tachycardia to 160 today after going to the bathroom, sustained.  Objective   Blood pressure 138/83, pulse 88, temperature 98.9 F (37.2 C), temperature source Oral, resp. rate (!) 22, weight 96.2 kg, SpO2 94 %.        Intake/Output Summary (Last 24 hours) at 09/23/2020 0802 Last data filed at 09/23/2020 0800 Gross per 24 hour  Intake 1537.11 ml  Output 600 ml  Net 937.11 ml    Filed Weights   09/22/20 1800  Weight: 96.2 kg   General:  ill appearing man lying in bed in NAD. HEENT: [place]/AT, eyes anicteric Neuro: awake and alert, symmetric grip strength, 5/5 flexion and extension of elbow, straight leg raises off bed. Able to lift both arms above his head. Tongue protrudes midline, moves symmetrically. Comprehension appears intact. No obvious expressive aphasia. CV: S1S2, RRR PULM:  no accessory muscle use, mild tachypnea. CTAB, reduced in bases. GI: soft, NT Extremities: no c/c/e, SCDs in place. Skin: warm, no rashes  CXR personally reviewed> atelectasis vs infiltrate RLL.   EKG: irreg, Afib with RVR  Resolved Hospital Problem list     Assessment & Plan:   Acute encephalopathy likely due to acute hyponatremia. Had waxing and waning dysarthria and and R-sided weakness. Initially a code stroke and received TPA  Initial  CT and CTA head without acute findings Hypertensive  P: -Stopping 3% saline this morning due to rapid correction. Needs ongoing monitoring. Need to watch free water intake once he gets restarted on a diet. -Serial Neuro checks and close monitoring in neuro ICU. Needs post- TPA repeat head CT after 24 hours. -Echo pending  -MRI brain ordered -PT, OT, SLP  Acute hypoosmolar hyponatremia with very concentrated urine.  Suspect is the cause of patient's acute confusion -Continue  frequent sodium monitoring every 2 hours.  May need to restart normal saline if correction slows her versus.  Max correction 10 mEq over 24 hours. -check TSH  Fever due to acute infection- unclear source. Covid negative. -Continue to monitor cultures - Continue broad-spectrum antibiotics  Acute hypoxic respiratory failure; CXR with atelectasis vs infiltrate RLL -check Legionella Ag -Supplemental oxygen as required to maintain SPO2 greater than 90%.  Wean as tolerated. - Pulmonary hygiene  Afib with RVR; new onset -metoprolol IV now -start metoprolol 50mg  BID -can't start AC yet due to TPA last night -Continue telemetry monitoring  Hypophosphatemia -repleted  Prediabetes -Accu-Cheks  Best Practice (right click and "Reselect all SmartList Selections" daily)   Diet/type: NPO DVT prophylaxis: SCD (TPA) GI prophylaxis: N/A Lines: N/A Foley:  N/A Code Status:  full code Last date of multidisciplinary goals of care discussion [  ]  Labs   CBC: Recent Labs  Lab 09/22/20 1800 09/22/20 1809 09/23/20 0332  WBC 9.9  --  8.2  NEUTROABS 8.6*  --   --   HGB 15.5 15.6 14.1  HCT 43.5 46.0 38.1*  MCV 86.8  --  83.2  PLT 216  --  199     Basic Metabolic Panel: Recent Labs  Lab 09/22/20 1800 09/22/20 1809 09/22/20 2044 09/22/20 2157 09/23/20 0143 09/23/20 0332 09/23/20 0533  NA 117* 118* 118* 122* 119* 120* 122*  K 4.1 3.7 3.3*  --   --  3.5  --   CL 86* 85* 87*  --   --  90*  --   CO2 20*  --  22  --   --  21*  --   GLUCOSE 124* 127* 122*  --   --  118*  --   BUN 11 13 10   --   --  12  --   CREATININE 0.97 0.90 0.93  --   --  0.97  --   CALCIUM 8.2*  --  7.7*  --   --  7.7*  --   MG  --   --   --  1.4*  --   --   --     GFR: Estimated Creatinine Clearance: 81.2 mL/min (by C-G formula based on SCr of 0.97 mg/dL).   This patient is critically ill with multiple organ system failure which requires frequent high complexity decision making, assessment, support,  evaluation, and titration of therapies. This was completed through the application of advanced monitoring technologies and extensive interpretation of multiple databases. During this encounter critical care time was devoted to patient care services described in this note for 38 minutes.   11/23/20, DO 09/23/20 10:01 AM [facility]

## 2020-09-23 NOTE — Progress Notes (Signed)
Lower extremity venous bilateral study completed.   Please see CV Proc for preliminary results.   Fardeen Steinberger, RDMS, RVT  

## 2020-09-23 NOTE — Progress Notes (Signed)
Critical Value: Na 119  09/23/2020  0240   Md notified: Elink MD, [place]  No new orders at this time; consistent with previous labs, pt on 3%

## 2020-09-24 DIAGNOSIS — G9341 Metabolic encephalopathy: Secondary | ICD-10-CM

## 2020-09-24 DIAGNOSIS — E871 Hypo-osmolality and hyponatremia: Secondary | ICD-10-CM | POA: Diagnosis not present

## 2020-09-24 DIAGNOSIS — G459 Transient cerebral ischemic attack, unspecified: Principal | ICD-10-CM

## 2020-09-24 DIAGNOSIS — I4891 Unspecified atrial fibrillation: Secondary | ICD-10-CM | POA: Diagnosis not present

## 2020-09-24 LAB — BASIC METABOLIC PANEL
Anion gap: 6 (ref 5–15)
Anion gap: 6 (ref 5–15)
BUN: 16 mg/dL (ref 8–23)
BUN: 15 mg/dL (ref 8–23)
CO2: 21 mmol/L — ABNORMAL LOW (ref 22–32)
CO2: 26 mmol/L (ref 22–32)
Calcium: 7.4 mg/dL — ABNORMAL LOW (ref 8.9–10.3)
Calcium: 7.8 mg/dL — ABNORMAL LOW (ref 8.9–10.3)
Chloride: 98 mmol/L (ref 98–111)
Chloride: 96 mmol/L — ABNORMAL LOW (ref 98–111)
Creatinine, Ser: 0.89 mg/dL (ref 0.61–1.24)
Creatinine, Ser: 0.97 mg/dL (ref 0.61–1.24)
GFR, Estimated: >60 mL/min (ref >60)
GFR, Estimated: >60 mL/min (ref >60)
Glucose, Bld: 104 mg/dL — ABNORMAL HIGH (ref 70–99)
Glucose, Bld: 105 mg/dL — ABNORMAL HIGH (ref 70–99)
Potassium: 3.7 mmol/L (ref 3.5–5.1)
Potassium: 4.5 mmol/L (ref 3.5–5.1)
Sodium: 125 mmol/L — ABNORMAL LOW (ref 135–145)
Sodium: 128 mmol/L — ABNORMAL LOW (ref 135–145)

## 2020-09-24 LAB — PHOSPHORUS: Phosphorus: 2.8 mg/dL (ref 2.5–4.6)

## 2020-09-24 LAB — SODIUM
Sodium: 127 mmol/L — ABNORMAL LOW (ref 135–145)
Sodium: 129 mmol/L — ABNORMAL LOW (ref 135–145)

## 2020-09-24 LAB — URINE CULTURE: Culture: NO GROWTH

## 2020-09-24 LAB — GLUCOSE, CAPILLARY
Glucose-Capillary: 111 mg/dL — ABNORMAL HIGH (ref 70–99)
Glucose-Capillary: 97 mg/dL (ref 70–99)
Glucose-Capillary: 147 mg/dL — ABNORMAL HIGH (ref 70–99)
Glucose-Capillary: 128 mg/dL — ABNORMAL HIGH (ref 70–99)
Glucose-Capillary: 139 mg/dL — ABNORMAL HIGH (ref 70–99)
Glucose-Capillary: 104 mg/dL — ABNORMAL HIGH (ref 70–99)

## 2020-09-24 LAB — LEGIONELLA PNEUMOPHILA SEROGP 1 UR AG: L. pneumophila Serogp 1 Ur Ag: NEGATIVE

## 2020-09-24 LAB — PROCALCITONIN: Procalcitonin: 0.45 ng/mL

## 2020-09-24 LAB — MAGNESIUM: Magnesium: 2.3 mg/dL (ref 1.7–2.4)

## 2020-09-24 MED ORDER — APIXABAN 5 MG PO TABS
5.0000 mg | ORAL_TABLET | Freq: Two times a day (BID) | ORAL | Status: DC
  Administered 2020-09-24 – 2020-09-27 (×7): 5 mg via ORAL
  Filled 2020-09-24 (×7): qty 1

## 2020-09-24 MED ORDER — SODIUM CHLORIDE 3 % IV SOLN
INTRAVENOUS | Status: DC
  Filled 2020-09-24 (×2): qty 500

## 2020-09-24 NOTE — Progress Notes (Signed)
ANTICOAGULATION CONSULT NOTE - Initial Consult  Pharmacy Consult for Apixaban Indication: atrial fibrillation and stroke  Allergies  Allergen Reactions   Acetaminophen     REACTION: eyes swell Per family, patient takes Goody's powders   Aspirin     REACTION: eyes swell   Ibuprofen     REACTION: eyes swell   Penicillins     REACTION: pt cant remember    Patient Measurements: Height: 6\' 2"  (188 cm) Weight: 96.2 kg (212 lb 1.3 oz) IBW/kg (Calculated) : 82.2  Vital Signs: Temp: 98.7 F (37.1 C) (09/07 0800) Temp Source: Axillary (09/07 0800) BP: 133/83 (09/07 0800) Pulse Rate: 72 (09/07 0800)  Labs: Recent Labs    09/22/20 1800 09/22/20 1809 09/22/20 2044 09/23/20 0332 09/24/20 0211  HGB 15.5 15.6  --  14.1  --   HCT 43.5 46.0  --  38.1*  --   PLT 216  --   --  199  --   APTT 34  --   --   --   --   LABPROT 16.3*  --   --   --   --   INR 1.3*  --   --   --   --   CREATININE 0.97 0.90 0.93 0.97 0.89    Estimated Creatinine Clearance: 88.5 mL/min (by C-G formula based on SCr of 0.89 mg/dL).   Medical History: Past Medical History:  Diagnosis Date   ALLERGIC RHINITIS 06/29/2007   Qualifier: Diagnosis of  By: 08/29/2007 MD, Jonny Ruiz    Allergy    seasonal   HYPERLIPIDEMIA 06/29/2007   Qualifier: Diagnosis of  By: 08/29/2007 MD, Jonny Ruiz    Right inguinal hernia 06/24/2011    Medications:  Scheduled:    stroke: mapping our early stages of recovery book   Does not apply Once   atorvastatin  20 mg Oral Daily   Chlorhexidine Gluconate Cloth  6 each Topical Daily   insulin aspart  0-9 Units Subcutaneous Q4H   mouth rinse  15 mL Mouth Rinse BID   metoprolol tartrate  50 mg Oral BID   pantoprazole (PROTONIX) IV  40 mg Intravenous QHS    Assessment: 70 yo M admitted 9/5 for stroke treated with TNK. New onset Afib RVR found on admission. No anticoagulants PTA. Patient's current CBC is stable, no s/sx of bleeding, and renal function is stable. Age <80, Scr<1.5, and weight> 60  kg. 5 mg BID is appropriate for treatment initiation.    Plan:  Start Apixaban 5 mg PO BID - will provide education prior to discharge Monitor for s/sx of bleeding and CBC  11/5, PharmD Candidate 09/24/2020,9:37 AM

## 2020-09-24 NOTE — Progress Notes (Signed)
eLink Physician-Brief Progress Note Patient Name: Alexander Pierce DOB: November 24, 1950 MRN: [id number]   Date of Service  09/24/2020  HPI/Events of Note  BMP result reviewed, serum sodium 125.  eICU Interventions  Continue to trend serum sodium.        Thomasene Lot Te[place] 09/24/2020, 6:08 AM

## 2020-09-24 NOTE — Progress Notes (Addendum)
SLP Cancellation Note  Patient Details Name: Alexander Pierce MRN: [id number] DOB: 11/01/1950   Cancelled treatment:        Pt getting bath. Second attempt, staff member in room speaking with family . 3rd attempt pt sleeping soundly during quiet hours. Will continue attempts - per chart- he has improved from 9/5 and will see how his is from encephalopathic standpoint and need for him to clear more before assessment (?)     Royce Macadamia 09/24/2020, 11:32 AM

## 2020-09-24 NOTE — Discharge Instructions (Signed)

## 2020-09-24 NOTE — Progress Notes (Addendum)
eLink Physician-Brief Progress Note Patient Name: Alexander Pierce DOB: 1950/01/21 MRN: [id number]   Date of Service  09/24/2020  HPI/Events of Note  Delayed reporting of a.m. labs, l confirmed with bedside RN that specimens were drawn.  eICU Interventions  Bedside RN instructed to call lab for update on pending labs status.        Thomasene Lot Alexander Pierce 09/24/2020, 4:14 AM

## 2020-09-24 NOTE — Progress Notes (Signed)
NAME:  Alexander Pierce, MRN:  [id number], DOB:  1950-02-22, LOS: 2 ADMISSION DATE:  09/22/2020, CONSULTATION DATE:  09/24/20 REFERRING MD:  Wilford Corner, CHIEF COMPLAINT:  confusion   History of Present Illness:  Alexander Pierce is a 70 y.o. M with PMH of allergic rhinitis, HL not on medication and osteoarthritis who presented to the ED with fever, confusion and slurred speech.  There was concern for R-sided facial droop and weakness and code stroke was called and patient received TPA.   CT and CTA head did not reveal acute stroke, hemorrhage or LVO.    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   He gave a speech on 9/3 and later that day told his wife that he did not feel well, he mostly stayed in bed and ate very little.  He developed a fever to 101F without coughing, URI sx's, urinary symptoms, abdominal pain or significant diarrhea.   On 9/5 his wife noted confusion and difficulty speaking so was brought to the ED.  No known recent infectious exposures, travelled to the [place] approximately one month ago, had a tick bite per prior notes several months ago, RMSF negative, but none recently and no rashes.    Work-up in the ED was significant for Na of 117, no leukocytosis or lactic acidosis, CXR with atelectasis and possible basal infiltrates, normal renal function, UA with ketones and high specific gravity.   Wife denies any ETOH use, no diuretics at home.  Prior labs show mild hyponatremia 130-132.   Pertinent  Medical History   has a past medical history of ALLERGIC RHINITIS (06/29/2007), Allergy, HYPERLIPIDEMIA (06/29/2007), and Right inguinal hernia (06/24/2011).   Significant Hospital Events: Including procedures, antibiotic start and stop dates in addition to other pertinent events   9/5 Presented to ED as code stroke, received TPA, concern for infection  Interim History /  Subjective:  Tmax 100.8. Agitated and attempting to get out of bed overnight requiring restraints. This morning he denies complaints other than being hungry.  Objective   Blood pressure (!) 117/57, pulse 72, temperature 98.4 F (36.9 C), resp. rate (!) 22, weight 96.2 kg, SpO2 94 %.        Intake/Output Summary (Last 24 hours) at 09/24/2020 0744 Last data filed at 09/24/2020 0700 Gross per 24 hour  Intake 1817.39 ml  Output 350 ml  Net 1467.39 ml    Filed Weights   09/22/20 1800  Weight: 96.2 kg   General:  ill-appearing man lying in bed in NAD HEENT: [place]/AT, eyes anicteric Neuro: sleeping but arouses easily to voice, tracking, moving all extremities, answering questions appropriately. CV: S1S2, RRR PULM: breathing comfortably on [place], CTAB GI: soft, NT, ND Extremities: no cyanosis or edema Skin: warm, dry, norashes  Na+ 125 PCT 0.45 TSH 0.854 Blood cultures> NGTD Urine culture> NG  EEG> intermittent slow, generalized. Mild diffuse encephalopathy. No seizures or epileptiform discharges.  Brain MRI> mild chronic ischemic small vessel disease  Echo> LVEF 45-50%, no RWMA. Normal RV function. Mild AS, trivial MR and AI .  Resolved Hospital Problem list     Assessment & Plan:   Acute encephalopathy likely due to acute hyponatremia. Had waxing and waning dysarthria and and R-sided weakness. Initially a code stroke and received TPA  Initial CT and CTA head without acute findings Hypertensive  P: -Con't 3% saline with frequent monitoring--  needs recheck now, d/w phlebotomy. Ongoing ICU monitoring while on 3%. -PT, OT, SLP -delirium precautions. Con't mitten restraints for now.  Acute hypoosmolar hyponatremia with very concentrated urine> probably due to poor PO intake PTA.  Suspect is the cause of patient's acute confusion -Con't frequent monitoring of Na+. Con't 3% saline. -check TSH  Fever due to acute infection- unclear source. Covid negative. -Continue to monitor  cultures - Continue broad-spectrum antibiotics; ok to deescalate vancomycin.  Acute hypoxic respiratory failure; CXR with atelectasis vs infiltrate RLL -Supplemental oxygen as required to maintain SpO2 greater than 90%.  Wean as tolerated. - Pulmonary hygiene and increase mobility  Paroxysmal Afib with RVR; new onset -metoprolol 50mg  BID, started this admission -start heparin gtt today -Continue telemetry monitoring  Hypophosphatemia, resolved -con't monitoring  Prediabetes; mild hyperglycemia with minimal insulin requirements -Accu-Cheks  Mild AS -OP follow up  Best Practice (right click and "Reselect all SmartList Selections" daily)   Diet/type: NPO- waiting on SLP eval DVT prophylaxis: systemic heparin  GI prophylaxis: N/A Lines: N/A Foley:  N/A Code Status:  full code Last date of multidisciplinary goals of care discussion [  ]  Labs   CBC: Recent Labs  Lab 09/22/20 1800 09/22/20 1809 09/23/20 0332  WBC 9.9  --  8.2  NEUTROABS 8.6*  --   --   HGB 15.5 15.6 14.1  HCT 43.5 46.0 38.1*  MCV 86.8  --  83.2  PLT 216  --  199     Basic Metabolic Panel: Recent Labs  Lab 09/22/20 1800 09/22/20 1809 09/22/20 2044 09/22/20 2157 09/23/20 0143 09/23/20 0332 09/23/20 0533 09/23/20 0827 09/23/20 0947 09/23/20 1530 09/24/20 0211  NA 117* 118* 118* 122*   < > 120* 122* 123* 123* 124* 125*  K 4.1 3.7 3.3*  --   --  3.5  --   --   --   --  3.7  CL 86* 85* 87*  --   --  90*  --   --   --   --  98  CO2 20*  --  22  --   --  21*  --   --   --   --  21*  GLUCOSE 124* 127* 122*  --   --  118*  --   --   --   --  104*  BUN 11 13 10   --   --  12  --   --   --   --  16  CREATININE 0.97 0.90 0.93  --   --  0.97  --   --   --   --  0.89  CALCIUM 8.2*  --  7.7*  --   --  7.7*  --   --   --   --  7.4*  MG  --   --   --  1.4*  --   --   --  1.9  --   --  2.3  PHOS  --   --   --   --   --   --   --  2.1*  --   --  2.8   < > = values in this interval not displayed.     GFR: Estimated Creatinine Clearance: 88.5 mL/min (by C-G formula based on SCr of 0.89 mg/dL).   This patient is critically ill with multiple organ system failure which requires frequent high complexity decision making, assessment, support, evaluation, and titration of therapies. This was completed through  the application of advanced monitoring technologies and extensive interpretation of multiple databases. During this encounter critical care time was devoted to patient care services described in this note for 35 minutes.   Steffanie Dunn, DO 09/24/20 8:46 AM [facility]

## 2020-09-24 NOTE — Progress Notes (Signed)
STROKE TEAM PROGRESS NOTE   SUBJECTIVE (INTERVAL HISTORY) His wife is at the bedside. Pt sitting in chair, not in distress. Had Tmax 100.8 overnight, but this morning no fever, normal mentation. Na 125 and now 128. His baseline Na 133-134. Will continue 3% saline. Do not think needs LP or TEE if blood culture negative. He has afib captured on tele and EKG, now on eliquis.    OBJECTIVE Temp:  [98.4 F (36.9 C)-100.8 F (38.2 C)] 98.7 F (37.1 C) (09/07 0800) Pulse Rate:  [62-118] 75 (09/07 1100) Cardiac Rhythm: Normal sinus rhythm (09/07 0800) Resp:  [14-32] 23 (09/07 1100) BP: (106-151)/(57-96) 134/94 (09/07 1100) SpO2:  [91 %-97 %] 95 % (09/07 1100) Weight:  [96.2 kg] 96.2 kg (09/07 0900)  Recent Labs  Lab 09/23/20 1152 09/23/20 1939 09/23/20 2314 09/24/20 0323 09/24/20 0751  GLUCAP 106* 107* 140* 111* 97   Recent Labs  Lab 09/22/20 1800 09/22/20 1809 09/22/20 2044 09/22/20 2157 09/23/20 0143 09/23/20 0332 09/23/20 0533 09/23/20 0827 09/23/20 0947 09/23/20 1530 09/24/20 0211 09/24/20 0921  NA 117* 118* 118* 122*   < > 120*   < > 123* 123* 124* 125* 128*  K 4.1 3.7 3.3*  --   --  3.5  --   --   --   --  3.7 4.5  CL 86* 85* 87*  --   --  90*  --   --   --   --  98 96*  CO2 20*  --  22  --   --  21*  --   --   --   --  21* 26  GLUCOSE 124* 127* 122*  --   --  118*  --   --   --   --  104* 105*  BUN 11 13 10   --   --  12  --   --   --   --  16 15  CREATININE 0.97 0.90 0.93  --   --  0.97  --   --   --   --  0.89 0.97  CALCIUM 8.2*  --  7.7*  --   --  7.7*  --   --   --   --  7.4* 7.8*  MG  --   --   --  1.4*  --   --   --  1.9  --   --  2.3  --   PHOS  --   --   --   --   --   --   --  2.1*  --   --  2.8  --    < > = values in this interval not displayed.   Recent Labs  Lab 09/22/20 1800  AST 34  ALT 24  ALKPHOS 64  BILITOT 1.0  PROT 7.2  ALBUMIN 3.2*   Recent Labs  Lab 09/22/20 1800 09/22/20 1809 09/23/20 0332  WBC 9.9  --  8.2  NEUTROABS 8.6*  --    --   HGB 15.5 15.6 14.1  HCT 43.5 46.0 38.1*  MCV 86.8  --  83.2  PLT 216  --  199   No results for input(s): CKTOTAL, CKMB, CKMBINDEX, TROPONINI in the last 168 hours. Recent Labs    09/22/20 1800  LABPROT 16.3*  INR 1.3*   Recent Labs    09/22/20 2013 09/22/20 2146  COLORURINE YELLOW YELLOW  LABSPEC >1.046* >1.046*  PHURINE 5.0 5.0  GLUCOSEU NEGATIVE NEGATIVE  HGBUR  MODERATE* MODERATE*  BILIRUBINUR NEGATIVE NEGATIVE  KETONESUR 20* 20*  PROTEINUR 100* 100*  NITRITE NEGATIVE NEGATIVE  LEUKOCYTESUR NEGATIVE NEGATIVE       Component Value Date/Time   CHOL 149 09/23/2020 0143   TRIG 53 09/23/2020 0143   HDL 42 09/23/2020 0143   CHOLHDL 3.5 09/23/2020 0143   VLDL 11 09/23/2020 0143   LDLCALC 96 09/23/2020 0143   Lab Results  Component Value Date   HGBA1C 5.9 (H) 09/23/2020   No results found for: LABOPIA, COCAINSCRNUR, LABBENZ, AMPHETMU, THCU, LABBARB  Recent Labs  Lab 09/22/20 2147  ETH <10    I have personally reviewed the radiological images below and agree with the radiology interpretations.  CT HEAD WO CONTRAST ( )  Result Date: 09/22/2020 CLINICAL DATA:  Follow-up examination for acute stroke, waxing and waning dysarthria, right-sided weakness. EXAM: CT HEAD WITHOUT CONTRAST TECHNIQUE: Contiguous axial images were obtained from the base of the skull through the vertex without intravenous contrast. COMPARISON:  CT from earlier the same day. FINDINGS: Brain: Cerebral volume within normal limits. No acute intracranial hemorrhage. No visible acute large vessel territory infarct. No mass lesion, midline shift or mass effect. No hydrocephalus or extra-axial fluid collection. Vascular: No asymmetric hyperdense vessel. Skull: Scalp soft tissues and calvarium within normal limits. Sinuses/Orbits: Globes and orbital soft tissues demonstrate no acute finding. Mild scattered mucosal thickening noted throughout the paranasal sinuses. Mastoid air cells remain clear. Other:  None. IMPRESSION: Stable head CT.  No acute intracranial abnormality identified. Electronically Signed   By: Rise Mu M.D.   On: 09/22/2020 22:18   MR BRAIN W WO CONTRAST  Result Date: 09/23/2020 CLINICAL DATA:  Stroke follow-up.  Aphasia. EXAM: MRI HEAD WITHOUT AND WITH CONTRAST TECHNIQUE: Multiplanar, multiecho pulse sequences of the brain and surrounding structures were obtained without and with intravenous contrast. CONTRAST:  9mL GADAVIST GADOBUTROL 1 MMOL/ML IV SOLN COMPARISON:  Head CT 09/22/2020 and MRI 04/19/2020 FINDINGS: The study is mildly motion degraded. Brain: There is no evidence of an acute infarct, intracranial hemorrhage, mass, midline shift, or extra-axial fluid collection. Small T2 hyperintensities in the cerebral white matter and pons are more conspicuous than on the prior MRI and are nonspecific but compatible with mild chronic small vessel ischemic disease. Mild cerebral atrophy is within normal limits for age. No abnormal enhancement is identified. Vascular: Major intracranial vascular flow voids are preserved. Skull and upper cervical spine: Unremarkable bone marrow signal. Sinuses/Orbits: Unremarkable orbits. Small mucous retention cyst in the right maxillary sinus. Clear mastoid air cells. Other: None. IMPRESSION: 1. No acute intracranial abnormality. 2. Mild chronic small vessel ischemic disease. Electronically Signed   By: Sebastian Ache M.D.   On: 09/23/2020 19:10   DG Chest Port 1 View  Result Date: 09/22/2020 CLINICAL DATA:  Questionable sepsis EXAM: PORTABLE CHEST 1 VIEW COMPARISON:  06/03/2020 FINDINGS: Cardiomegaly. Mild vascular congestion. Bibasilar airspace opacities, bibasilar airspace opacities. No effusions or overt edema. No acute bony abnormality. IMPRESSION: Cardiomegaly, vascular congestion. Bibasilar atelectasis or infiltrates. Electronically Signed   By: Charlett Nose M.D.   On: 09/22/2020 19:19   EEG adult  Result Date: 09/23/2020 Charlsie Quest,  MD     09/23/2020 10:27 AM Patient Name: TERRIUS GENTILE MRN: [id number] Epilepsy Attending: Charlsie Quest Referring Physician/Provider: Dr Marvel Plan Date: 09/23/2020 Duration: 22.21 mins Patient history: 70 y.o. male presenting with acute onset of expressive and receptive aphasia. EEG to evaluate for seizures. Level of alertness: Awake, drowsy AEDs during EEG study: None  Technical aspects: This EEG study was done with scalp electrodes positioned according to the 10-20 International system of electrode placement. Electrical activity was acquired at a sampling rate of 500Hz  and reviewed with a high frequency filter of 70Hz  and a low frequency filter of 1Hz . EEG data were recorded continuously and digitally stored. Description: The posterior dominant rhythm consists of 7.5 Hz activity of moderate voltage (25-35 uV) seen predominantly in posterior head regions, symmetric and reactive to eye opening and eye closing. Drowsiness was characterized by attenuation of the posterior background rhythm. EEG showed intermittent generalized 3 to 6 Hz theta-delta slowing. Hyperventilation and photic stimulation were not performed.   ABNORMALITY - Intermittent slow, generalized IMPRESSION: This study is suggestive of mild diffuse encephalopathy, nonspecific etiology. No seizures or epileptiform discharges were seen throughout the recording.   ECHOCARDIOGRAM COMPLETE  Result Date: 09/23/2020    ECHOCARDIOGRAM REPORT   Patient Name:   DONTREAL MIERA Date of Exam: 09/23/2020 Medical Rec #:  11/23/2020     Height:       74.0 in Accession #:    [id number]    Weight:       212.1 lb Date of Birth:  1951-01-14     BSA:          2.228 m Patient Age:    69 years      BP:           138/83 mmHg Patient Gender: M             HR:           100 bpm. Exam Location:  Inpatient Procedure: 2D Echo, Cardiac Doppler and Color Doppler REPORT CONTAINS CRITICAL RESULT  Reported to: Dr [id number]. Indications:    Fever  History:        Patient  has no prior history of Echocardiogram examinations.                 Risk Factors:Dyslipidemia.  Sonographer:    [id number] RDCS Referring Phys: 11/28/1950 LAURA R GLEASON IMPRESSIONS  1. Left ventricular ejection fraction, by estimation, is 45 to 50%. The left ventricle has mildly decreased function. The left ventricle has no regional wall motion abnormalities. Left ventricular diastolic function could not be evaluated.  2. Right ventricular systolic function is normal. The right ventricular size is normal. There is normal pulmonary artery systolic pressure.  3. The mitral valve is grossly normal. Trivial mitral valve regurgitation.  4. 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 The aortic valve is calcified. Aortic valve regurgitation is trivial. Mild aortic valve stenosis.  5. Aortic dilatation noted. 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 FINDINGS  Left Ventricle: Left ventricular ejection fraction, by estimation, is 45 to 50%. The left ventricle has mildly decreased function. The left ventricle has no regional wall motion abnormalities. The left ventricular internal cavity size was normal in size. There is no left ventricular hypertrophy. Left ventricular diastolic function could not be evaluated due to atrial fibrillation. Left ventricular diastolic function could not be evaluated. Right Ventricle: The right ventricular size is normal. Right vetricular wall thickness was not well visualized. Right ventricular systolic function is normal. There is normal pulmonary artery systolic pressure. The tricuspid regurgitant velocity is 2.55 m/s, and with an assumed right atrial pressure of 3 mmHg, the estimated right ventricular systolic pressure is 29.0 mmHg. Left Atrium: Left atrial size was normal in size. Right Atrium: Right atrial  size was normal in size. Pericardium: There is no evidence of pericardial effusion. Mitral Valve: The mitral valve is grossly normal. Trivial mitral valve  regurgitation. Tricuspid Valve: The tricuspid valve is grossly normal. Tricuspid valve regurgitation is trivial. 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 The aortic valve is calcified. Aortic valve regurgitation is trivial. Aortic regurgitation PHT measures 345 msec. Mild aortic stenosis is present. Aortic valve mean gradient measures 13.7 mmHg. Aortic valve peak gradient measures 23.7 mmHg. Aortic valve area, by VTI measures 2.24 cm. Pulmonic Valve: The pulmonic valve was normal in structure. Pulmonic valve regurgitation is not visualized. Aorta: Aortic dilatation noted. 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 IAS/Shunts: The atrial septum is grossly normal.  LEFT VENTRICLE PLAX 2D LVIDd:         5.10 cm LVIDs:         3.80 cm LV PW:         1.10 cm LV IVS:        1.30 cm LVOT diam:     2.60 cm LV SV:         75 LV SV Index:   34 LVOT Area:     5.31 cm  LV Volumes (MOD) LV vol d, MOD A2C: 68.0 ml LV vol d, MOD A4C: 108.0 ml LV vol s, MOD A2C: 49.1 ml LV vol s, MOD A4C: 55.2 ml LV SV MOD A2C:     18.9 ml LV SV MOD A4C:     108.0 ml LV SV MOD BP:      32.7 ml RIGHT VENTRICLE RV Basal diam:  2.10 cm RV Mid diam:    1.20 cm RV S prime:     12.90 cm/s TAPSE (M-mode): 2.2 cm LEFT ATRIUM             Index       RIGHT ATRIUM           Index LA Vol (A2C):   53.3 ml 23.92 ml/m RA Area:     15.40 cm LA Vol (A4C):   51.7 ml 23.20 ml/m RA Volume:   33.30 ml  14.94 ml/m LA Biplane Vol: 52.6 ml 23.61 ml/m  AORTIC VALVE                    PULMONIC VALVE AV Area (Vmax):    1.98 cm     PV Vmax:       1.05 m/s AV Area (Vmean):   1.91 cm     PV Vmean:      67.300 cm/s AV Area (VTI):     2.24 cm     PV VTI:        0.142 m AV Vmax:           243.33 cm/s  PV Peak grad:  4.4 mmHg AV Vmean:          170.000 cm/s PV Mean grad:  2.0 mmHg AV VTI:            0.334 m AV Peak Grad:      23.7 mmHg AV Mean Grad:      13.7 mmHg LVOT Vmax:         90.70 cm/s LVOT Vmean:        61.100 cm/s LVOT VTI:           0.141 m LVOT/AV VTI ratio: 0.42 AI PHT:            345  msec  AORTA Ao Root diam: 3.70 cm Ao Asc diam:  5.40 cm MR Peak grad: 109.4 mmHg  TRICUSPID VALVE MR Mean grad: 76.0 mmHg   TR Peak grad:   26.0 mmHg MR Vmax:      523.00 cm/s TR Vmax:        255.00 cm/s MR Vmean:     408.0 cm/s                           SHUNTS                           Systemic VTI:  0.14 m                           Systemic Diam: 2.60 cm Kristeen Miss MD Electronically signed by Kristeen Miss MD Signature Date/Time: 09/23/2020/1:03:36 PM    Final    CT HEAD CODE STROKE WO CONTRAST  Result Date: 09/22/2020 CLINICAL DATA:  Code stroke. Neuro deficit, acute, stroke suspected. Additional history provided: Last known well 1430, aphasia, LVO score 4. EXAM: CT HEAD WITHOUT CONTRAST TECHNIQUE: Contiguous axial images were obtained from the base of the skull through the vertex without intravenous contrast. COMPARISON:  Brain MRI 04/19/2020. Noncontrast head CT 04/19/2020. CT angiogram head/neck 04/19/2020. FINDINGS: Brain: Cerebral volume is normal. There is no acute intracranial hemorrhage. No demarcated cortical infarct. No extra-axial fluid collection. No evidence of an intracranial mass. No midline shift. Vascular: No hyperdense vessel. Skull: Normal. Negative for fracture or focal lesion. Sinuses/Orbits: Visualized orbits show no acute finding. Postsurgical appearance of the paranasal sinuses. Mild scattered paranasal sinus mucosal thickening at the imaged levels. Small bilateral maxillary sinus mucous retention cysts. ASPECTS ([place] Stroke Program Early CT Score) - Ganglionic level infarction (caudate, lentiform nuclei, internal capsule, insula, M1-M3 cortex): 7 - Supraganglionic infarction (M4-M6 cortex): 3 Total score (0-10 with 10 being normal): 10 These results were communicated to Dr. Otelia Limes at [facility] pmon 9/5/2022by text page via the [facility] messaging system. IMPRESSION: No evidence of acute intracranial abnormality. Paranasal sinus  disease, as described. Electronically Signed   By: Jackey Loge D.O.   On: 09/22/2020 18:12   VAS [place] LOWER EXTREMITY VENOUS (DVT)  Result Date: 09/23/2020  Lower Venous DVT Study Patient Name:  JAYKOB MINICHIELLO  Date of Exam:   09/23/2020 Medical Rec #: [id number]      Accession #:    [id number] Date of Birth: Dec 24, 1950      Patient Gender: M Patient Age:   57 years Exam Location:  [facility] Procedure:      VAS [place] LOWER EXTREMITY VENOUS (DVT) Referring Phys: Scheryl Marten Trooper Olander --------------------------------------------------------------------------------  Indications: Stroke.  Comparison Study: No prior studies. Performing Technologist: Jean Rosenthal RDMS, RVT  Examination Guidelines: A complete evaluation includes B-mode imaging, spectral Doppler, color Doppler, and power Doppler as needed of all accessible portions of each vessel. Bilateral testing is considered an integral part of a complete examination. Limited examinations for reoccurring indications may be performed as noted. The reflux portion of the exam is performed with the patient in reverse Trendelenburg.  +---------+---------------+---------+-----------+----------+--------------+ RIGHT    CompressibilityPhasicitySpontaneityPropertiesThrombus Aging +---------+---------------+---------+-----------+----------+--------------+ CFV      Full           Yes      Yes                                 +---------+---------------+---------+-----------+----------+--------------+  SFJ      Full                                                        +---------+---------------+---------+-----------+----------+--------------+ FV Prox  Full                                                        +---------+---------------+---------+-----------+----------+--------------+ FV Mid   Full                                                        +---------+---------------+---------+-----------+----------+--------------+ FV DistalFull                                                         +---------+---------------+---------+-----------+----------+--------------+ PFV      Full                                                        +---------+---------------+---------+-----------+----------+--------------+ POP      Full           Yes      Yes                                 +---------+---------------+---------+-----------+----------+--------------+ PTV      Full                                                        +---------+---------------+---------+-----------+----------+--------------+ PERO     Full                                                        +---------+---------------+---------+-----------+----------+--------------+ Gastroc  Full                                                        +---------+---------------+---------+-----------+----------+--------------+   +---------+---------------+---------+-----------+----------+--------------+ LEFT     CompressibilityPhasicitySpontaneityPropertiesThrombus Aging +---------+---------------+---------+-----------+----------+--------------+ CFV      Full           Yes      Yes                                 +---------+---------------+---------+-----------+----------+--------------+  SFJ      Full                                                        +---------+---------------+---------+-----------+----------+--------------+ FV Prox  Full                                                        +---------+---------------+---------+-----------+----------+--------------+ FV Mid   Full                                                        +---------+---------------+---------+-----------+----------+--------------+ FV DistalFull                                                        +---------+---------------+---------+-----------+----------+--------------+ PFV      Full                                                         +---------+---------------+---------+-----------+----------+--------------+ POP      Full           Yes      Yes                                 +---------+---------------+---------+-----------+----------+--------------+ PTV      Full                                                        +---------+---------------+---------+-----------+----------+--------------+ PERO     Full                                                        +---------+---------------+---------+-----------+----------+--------------+ Gastroc  Full                                                        +---------+---------------+---------+-----------+----------+--------------+     Summary: RIGHT: - There is no evidence of deep vein thrombosis in the lower extremity.  - No cystic structure found in the popliteal fossa.  LEFT: - There is no evidence of deep vein thrombosis in the lower extremity.  - No cystic structure found in the popliteal  fossa.  *See table(s) above for measurements and observations. Electronically signed by Coral Else MD on 09/23/2020 at 7:17:39 PM.    Final    CT ANGIO HEAD NECK W WO CM W PERF (CODE STROKE)  Result Date: 09/22/2020 CLINICAL DATA:  Neuro deficit, acute, stroke suspected. EXAM: CT ANGIOGRAPHY HEAD AND NECK CT PERFUSION BRAIN TECHNIQUE: Multidetector CT imaging of the head and neck was performed using the standard protocol during bolus administration of intravenous contrast. Multiplanar CT image reconstructions and MIPs were obtained to evaluate the vascular anatomy. Carotid stenosis measurements (when applicable) are obtained utilizing NASCET criteria, using the distal internal carotid diameter as the denominator. Multiphase CT imaging of the brain was performed following IV bolus contrast injection. Subsequent parametric perfusion maps were calculated using RAPID software. CONTRAST:  Administered contrast not known at this time. COMPARISON:  Noncontrast head CT performed earlier  today 09/22/2020. Brain MRI 04/19/2020. CT angiogram head/neck 04/19/2020. FINDINGS: CTA NECK FINDINGS Aortic arch: Standard aortic branching. Atherosclerotic plaque within the visualized aortic arch. Streak artifact from a dense left-sided contrast bolus partially obscures the left subclavian artery. Within this limitation, there is no appreciable hemodynamically significant stenosis within the innominate or proximal subclavian arteries. Right carotid system: CCA and ICA patent within the neck without stenosis. Minimal atherosclerotic plaque within the carotid bifurcation. Left carotid system: CCA and ICA patent within the neck without stenosis. Mild soft and calcified plaque within the carotid bifurcation and proximal ICA. Vertebral arteries: Beam hardening artifact from a dense left-sided contrast bolus partially obscures the proximal left vertebral artery. Within this limitation, the vertebral arteries are codominant and patent within the neck without stenosis. Skeleton: Cervical spondylosis. Fusion across the C5-C6 disc space with associated left-sided facet joint ankylosis at this level. No acute bony abnormality or aggressive osseous lesion. Other neck: No neck mass or cervical lymphadenopathy. Upper chest: No consolidation within the imaged lung apices. Review of the MIP images confirms the above findings CTA HEAD FINDINGS Anterior circulation: The intracranial internal carotid arteries are patent. No M2 proximal branch occlusion or high-grade proximal stenosis is identified. The anterior cerebral arteries are patent. No intracranial aneurysm is identified. Posterior circulation: The intracranial vertebral arteries are patent. The basilar artery is patent. The posterior cerebral arteries are patent. The posterior cerebral arteries are patent. Posterior communicating arteries are hypoplastic or absent bilaterally. Venous sinuses: Within the limitations of contrast timing, no convincing thrombus. Anatomic  variants: As described Review of the MIP images confirms the above findings CT Brain Perfusion Findings: The CT perfusion post processing failed. In reviewing the source imaging, this is likely due to patient motion. No large vessel occlusion identified. These results were called by telephone at the time of interpretation on 09/22/2020 at 6:40 pm to provider ERIC [facility] , who verbally acknowledged these results. IMPRESSION: CTA neck: Streak and beam hardening artifact arising from a dense left-sided contrast bolus partially obscures the proximal left vertebral artery. Within this limitation, the common carotid, internal carotid and vertebral arteries are patent within the neck without stenosis. Mild atherosclerotic plaque within the bilateral carotid systems within the neck, as described. CTA head: No intracranial large vessel occlusion or proximal high-grade arterial stenosis identified. CT perfusion head: Failed CT perfusion, likely due to patient motion. Electronically Signed   By: Jackey Loge D.O.   On: 09/22/2020 18:51     PHYSICAL EXAM  Temp:  [98.4 F (36.9 C)-100.8 F (38.2 C)] 98.7 F (37.1 C) (09/07 0800) Pulse Rate:  [62-118] 75 (09/07 1100)  Resp:  [14-32] 23 (09/07 1100) BP: (106-151)/(57-96) 134/94 (09/07 1100) SpO2:  [91 %-97 %] 95 % (09/07 1100) Weight:  [96.2 kg] 96.2 kg (09/07 0900)  General - Well nourished, well developed, in no apparent distress.  Ophthalmologic - fundi not visualized due to noncooperation.  Cardiovascular - Regular rhythm and rate.  Mental Status -  Level of arousal and orientation to time, place, and person were intact. Language including expression, naming, repetition, comprehension was assessed and found intact. No apraxia Able to do backward spelling of WORLD, serial 7 correct 3/5 Registration 3/3, and delayed recall 3/3 Fund of Knowledge was assessed and was intact.  Cranial Nerves II - XII - II - Visual field intact OU. III, IV, VI -  Extraocular movements intact. V - Facial sensation intact bilaterally. VII - Facial movement intact bilaterally. VIII - Hearing & vestibular intact bilaterally. X - Palate elevates symmetrically. XI - Chin turning & shoulder shrug intact bilaterally. XII - Tongue protrusion intact.  Motor Strength - The patient's strength was normal in all extremities and pronator drift was absent.  Bulk was normal and fasciculations were absent.   Motor Tone - Muscle tone was assessed at the neck and appendages and was normal.  Reflexes - The patient's reflexes were symmetrical in all extremities and he had no pathological reflexes.  Sensory - Light touch, temperature/pinprick were assessed and were symmetrical.    Coordination - The patient had normal movements in the hands and feet with no ataxia or dysmetria.  Tremor was absent.  Gait and Station - deferred.   ASSESSMENT/PLAN Mr. Milta DeitersJohn M Howlett is a 70 y.o. male with history of HLD, vertigo episode in 04/2020 admitted for aphasia, confusion and fever.  Status post TNK.  TIA vs. Seizure 2/2 hyponatremia vs. Encephalopathy 2/2 fever. Less likely encephalitis CT head negative CTA head neck negative LE venous Doppler negative for DVT MRI with and without contrast neg 2D Echo EF 45 to 50% EEG mild diffuse encephalopathy, no seizure LDL 96 HgbA1c 5.9 SCDs for VTE prophylaxis No antithrombotic prior to admission, now on eliquis 5mg  bid Therapy recommendations: Outpatient PT OT Disposition: Pending  Fever, unclear origin T-max 103-> afebrile->100.1->100.8 Blood culture pending Urine culture neg UA WBC 6-10 CXR bibasilar atelectasis or infiltrates COVID PCR negative Given significant neuro improvement, less likely encephalitis, will hold off LP  Given significant improvement, negative 2D echo, no elective food of endocarditis, will hold off TEE unless blood culture positive On cefepime and vancomycin -> cefepime alone  Hyponatremia Na  117->118->122->123->124->125->128 In 04/2020 Na 134->132 On 3% saline @ 50->30->50 EEG no seizure Continue 3% saline, no more than 10 mEq increase within 24h to avoid CPM  PAF Intermittent A. fib on telemetry-> persistent now Captured on EKG On eliquis 5mg  bid now  Hypertension Stable Long term BP goal normotensive  Hyperlipidemia Home meds: None LDL 96, goal < 70 Now on Lipitor 20 Continue statin at discharge  Other Stroke Risk Factors Advanced age  Other Active Problems Hypokalemia K 3.3 -> 4.5  Hospital day # 2   This patient is critically ill due to stroke symptoms s/p TNK, fever of unknown origin, profound hyponatremia, PAF not on AC and at significant risk of neurological worsening, death form stroke, hemorrhage, heart failure, seizure, sepsis. This patient's care requires constant monitoring of vital signs, hemodynamics, respiratory and cardiac monitoring, review of multiple databases, neurological assessment, discussion with family, other specialists and medical decision making of high complexity. I spent 40 minutes of  neurocritical care time in the care of this patient. I had long discussion with pt and wife at bedside, updated pt current condition, treatment plan and potential prognosis, and answered all the questions. They expressed understanding and appreciation.    Marvel Plan, MD PhD Stroke Neurology 09/24/2020 11:14 AM    To contact Stroke Continuity provider, please refer to [email or web address]. After hours, contact General Neurology

## 2020-09-25 DIAGNOSIS — Z9282 Status post administration of tPA (rtPA) in a different facility within the last 24 hours prior to admission to current facility: Secondary | ICD-10-CM

## 2020-09-25 DIAGNOSIS — E871 Hypo-osmolality and hyponatremia: Secondary | ICD-10-CM | POA: Diagnosis not present

## 2020-09-25 DIAGNOSIS — I350 Nonrheumatic aortic (valve) stenosis: Secondary | ICD-10-CM

## 2020-09-25 DIAGNOSIS — E860 Dehydration: Secondary | ICD-10-CM | POA: Diagnosis not present

## 2020-09-25 DIAGNOSIS — E876 Hypokalemia: Secondary | ICD-10-CM

## 2020-09-25 DIAGNOSIS — I4891 Unspecified atrial fibrillation: Secondary | ICD-10-CM | POA: Diagnosis not present

## 2020-09-25 LAB — BASIC METABOLIC PANEL
Anion gap: 8 (ref 5–15)
BUN: 8 mg/dL (ref 8–23)
CO2: 27 mmol/L (ref 22–32)
Calcium: 7.6 mg/dL — ABNORMAL LOW (ref 8.9–10.3)
Chloride: 95 mmol/L — ABNORMAL LOW (ref 98–111)
Creatinine, Ser: 0.76 mg/dL (ref 0.61–1.24)
GFR, Estimated: >60 mL/min (ref >60)
Glucose, Bld: 94 mg/dL (ref 70–99)
Potassium: 2.8 mmol/L — ABNORMAL LOW (ref 3.5–5.1)
Sodium: 130 mmol/L — ABNORMAL LOW (ref 135–145)

## 2020-09-25 LAB — GLUCOSE, CAPILLARY
Glucose-Capillary: 105 mg/dL — ABNORMAL HIGH (ref 70–99)
Glucose-Capillary: 102 mg/dL — ABNORMAL HIGH (ref 70–99)
Glucose-Capillary: 138 mg/dL — ABNORMAL HIGH (ref 70–99)
Glucose-Capillary: 111 mg/dL — ABNORMAL HIGH (ref 70–99)
Glucose-Capillary: 111 mg/dL — ABNORMAL HIGH (ref 70–99)

## 2020-09-25 LAB — CBC
HCT: 36 % — ABNORMAL LOW (ref 39.0–52.0)
Hemoglobin: 12.3 g/dL — ABNORMAL LOW (ref 13.0–17.0)
MCH: 29.8 pg (ref 26.0–34.0)
MCHC: 34.2 g/dL (ref 30.0–36.0)
MCV: 87.2 fL (ref 80.0–100.0)
Platelets: 211 10*3/uL (ref 150–400)
RBC: 4.13 MIL/uL — ABNORMAL LOW (ref 4.22–5.81)
RDW: 13.4 % (ref 11.5–15.5)
WBC: 7 10*3/uL (ref 4.0–10.5)
nRBC: 0 % (ref 0.0–0.2)

## 2020-09-25 LAB — SODIUM
Sodium: 129 mmol/L — ABNORMAL LOW (ref 135–145)
Sodium: 132 mmol/L — ABNORMAL LOW (ref 135–145)

## 2020-09-25 MED ORDER — SODIUM CHLORIDE 0.9 % IV SOLN
2.0000 g | INTRAVENOUS | Status: DC
  Administered 2020-09-25 – 2020-09-26 (×2): 2 g via INTRAVENOUS
  Filled 2020-09-25 (×3): qty 20

## 2020-09-25 MED ORDER — SODIUM CHLORIDE 1 G PO TABS
2.0000 g | ORAL_TABLET | Freq: Three times a day (TID) | ORAL | Status: DC
  Administered 2020-09-26 – 2020-09-27 (×5): 2 g via ORAL
  Filled 2020-09-25 (×6): qty 2

## 2020-09-25 MED ORDER — POTASSIUM CHLORIDE CRYS ER 20 MEQ PO TBCR
40.0000 meq | EXTENDED_RELEASE_TABLET | ORAL | Status: AC
  Administered 2020-09-25 – 2020-09-26 (×2): 40 meq via ORAL
  Filled 2020-09-25 (×2): qty 2

## 2020-09-25 MED ORDER — PANTOPRAZOLE SODIUM 40 MG PO TBEC
40.0000 mg | DELAYED_RELEASE_TABLET | Freq: Every day | ORAL | Status: DC
  Administered 2020-09-25 – 2020-09-27 (×3): 40 mg via ORAL
  Filled 2020-09-25 (×3): qty 1

## 2020-09-25 MED ORDER — SODIUM CHLORIDE 1 G PO TABS
2.0000 g | ORAL_TABLET | Freq: Two times a day (BID) | ORAL | Status: DC
  Administered 2020-09-25: 2 g via ORAL
  Filled 2020-09-25: qty 2

## 2020-09-25 NOTE — Evaluation (Signed)
Speech Language Pathology Evaluation Patient Details Name: Alexander Pierce MRN: [id number] DOB: 10/26/1950 Today's Date: 09/25/2020 Time: 7106-2694 SLP Time Calculation (min) (ACUTE ONLY): 22 min  Problem List:  Patient Active Problem List   Diagnosis Date Noted   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   Altered mental status 09/22/2020   Dehydration 09/22/2020   Cough 06/03/2020   Hyperglycemia 04/24/2020   Orthostasis 04/24/2020   Blood pressure elevated without history of HTN 02/27/2019   Vitamin D deficiency 02/27/2019   Low back pain 02/27/2019   Finger infection 07/20/2018   Right lateral epicondylitis 01/23/2018   Hyponatremia 05/04/2016   Tick bite of left upper arm 05/03/2016   Fever 05/03/2016   Upper respiratory tract infection 05/03/2016   Degenerative arthritis of knee, bilateral 03/26/2015   Increased prostate specific antigen (PSA) velocity 12/25/2014   Perianal mass 12/25/2014   Neck arthritis 03/16/2013   Primary localized osteoarthrosis, lower leg 02/15/2013   Encounter for monitoring thrombolytic therapy 02/07/2013   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 07/09/2011   Skin lesion 06/24/2011   Encounter for well adult exam with abnormal findings 06/19/2011   Hyperlipidemia 06/29/2007   Allergic rhinitis 06/29/2007   Past Medical History:  Past Medical History:  Diagnosis Date   ALLERGIC RHINITIS 06/29/2007   Qualifier: Diagnosis of  By: Jonny Ruiz MD, Len Blalock    Allergy    seasonal   HYPERLIPIDEMIA 06/29/2007   Qualifier: Diagnosis of  By: Jonny Ruiz MD, Len Blalock    Right inguinal hernia 06/24/2011   Past Surgical History:  Past Surgical History:  Procedure Laterality Date   INGUINAL HERNIA REPAIR     sinus surgury  1992   dr Haroldine Laws   HPI:  Alexander Pierce is a 70 y.o. presented with fever, confusion and slurred speech. CT no acute intracranial  abnormality identified. MRI pending. MD note states, acute encephalopathy likely due to hyponatremia, fever due to acute infection- unclear etiology., hypoxic respiratory failure (no intubation). PMH: hyperlipidemia   Assessment / Plan / Recommendation Clinical Impression  Pt was seen for a speech language evaluation. Pt awake and alert with wife and driver at bedside. Pt reports feeling that his thinking is currently at 90% compared to baseline. Pt oriented x4. SLP administered the SLUMS evaluation. SLUMS revealed impairments in short term memory (storage/retrieval), working memory, and attention, with pt scoring 18/30. Score most greatly impacted by deficits in attention to detail in complex sentences. Orientation, divergent naming, and visuospatial skills largely intact. SLP will follow for therapy targeting higher level executive functioning and attention skills. Recommend outpatient ST after discharge.    SLP Assessment  SLP Recommendation/Assessment: Patient needs continued Speech Lanaguage Pathology Services SLP Visit Diagnosis: Cognitive communication deficit (R41.841)    Follow Up Recommendations  Outpatient SLP    Frequency and Duration min 1 x/week  2 weeks      SLP Evaluation Cognition  Overall Cognitive Status: Within Functional Limits for tasks assessed Arousal/Alertness: Awake/alert Orientation Level: Oriented X4 Year: 2022 Day of Week: Correct Attention: Sustained Sustained Attention: Impaired Sustained Attention Impairment: Functional complex;Verbal complex Memory: Impaired Memory Impairment: Storage deficit;Retrieval deficit Awareness: Appears intact Problem Solving: Appears intact Executive Function: Sequencing;Organizing Sequencing: Impaired Sequencing Impairment: Verbal basic Organizing: Impaired Organizing Impairment: Verbal complex Safety/Judgment: Appears intact       Comprehension  Auditory Comprehension Overall Auditory Comprehension: Appears within  functional limits for tasks assessed  Conversation: Complex Interfering Components: Attention EffectiveTechniques: Repetition (if needed) Visual Recognition/Discrimination Discrimination: Within Function Limits Reading Comprehension Reading Status: Not tested    Expression Expression Primary Mode of Expression: Verbal Verbal Expression Overall Verbal Expression: Appears within functional limits for tasks assessed Initiation: No impairment Level of Generative/Spontaneous Verbalization: Conversation Pragmatics: No impairment Written Expression Dominant Hand: Right   Oral / Motor  Oral Motor/Sensory Function Overall Oral Motor/Sensory Function: Within functional limits Motor Speech Overall Motor Speech: Appears within functional limits for tasks assessed Respiration: Within functional limits Phonation: Normal Resonance: Within functional limits Articulation: Within functional limitis Intelligibility: Intelligible Motor Planning: Witnin functional limits Motor Speech Errors: Not applicable   GO                    [name] 09/25/2020, 1:12 PM

## 2020-09-25 NOTE — Progress Notes (Signed)
NAME:  Alexander Pierce, MRN:  [id number], DOB:  [birth year], LOS: 3 ADMISSION DATE:  09/22/2020, CONSULTATION DATE:  09/25/20 REFERRING MD:  Wilford Corner, CHIEF COMPLAINT:  confusion   History of Present Illness:  Alexander Pierce is a 70 y.o. M with PMH of allergic rhinitis, HL not on medication and osteoarthritis who presented to the ED with fever, confusion and slurred speech.  There was concern for R-sided facial droop and weakness and code stroke was called and patient received TPA.   CT and CTA head did not reveal acute stroke, hemorrhage or LVO.    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   He gave a speech on 9/3 and later that day told his wife that he did not feel well, he mostly stayed in bed and ate very little.  He developed a fever to 101F without coughing, URI sx's, urinary symptoms, abdominal pain or significant diarrhea.   On 9/5 his wife noted confusion and difficulty speaking so was brought to the ED.  No known recent infectious exposures, travelled to the [place] approximately one month ago, had a tick bite per prior notes several months ago, RMSF negative, but none recently and no rashes.    Work-up in the ED was significant for Na of 117, no leukocytosis or lactic acidosis, CXR with atelectasis and possible basal infiltrates, normal renal function, UA with ketones and high specific gravity.   Wife denies any ETOH use, no diuretics at home.  Prior labs show mild hyponatremia 130-132.   Pertinent  Medical History   has a past medical history of ALLERGIC RHINITIS (06/29/2007), Allergy, HYPERLIPIDEMIA (06/29/2007), and Right inguinal hernia (06/24/2011).   Significant Hospital Events: Including procedures, antibiotic start and stop dates in addition to other pertinent events   9/5 Presented to ED as code stroke, received TPA, concern for infection  Interim History /  Subjective:  Has been tolerating cardiac diet. He denies complaints this morning.  Objective   Blood pressure (!) 147/85, pulse 66, temperature 98.5 F (36.9 C), temperature source Oral, resp. rate 19, height 6\' 2"  (1.88 m), weight 96.2 kg, SpO2 93 %.        Intake/Output Summary (Last 24 hours) at 09/25/2020 0717 Last data filed at 09/25/2020 0600 Gross per 24 hour  Intake 1613.24 ml  Output 225 ml  Net 1388.24 ml    Filed Weights   09/22/20 1800 09/24/20 0900  Weight: 96.2 kg 96.2 kg   General:  elderly man lying in bed in NAD HEENT: [place]/AT, eyes anicteric Neuro: Sleeping but arouses to verbal stimulation. Normal speech, answering questions appropriately. Moving all extremities. CV: RRR, S1S2 PULM: breathing comfortably on RA GI: soft, NT Extremities: no peripheral edema, no cyanosis or clubbing Skin: warm, dry, no rashes.   Na+ 132 Blood cultures> NGTD   Resolved Hospital Problem list     Assessment & Plan:   Acute encephalopathy likely due to acute hyponatremia. Presented with waxing and waning dysarthria and and R-sided weakness. Initially a code stroke and received TPA  Initial CT and CTA head without acute findings Hypertensive  P: -Con't 3% saline with frequent monitoring Q6h. -PT, OT, SLP -Delirium precautions.   Acute hypoosmolar hyponatremia with very concentrated urine> probably due to poor PO intake PTA.  Suspect is the cause of patient's acute confusion -Con't monitoring sodium Q6h. Con't 3% saline 50cc/hr.  Fever due to acute infection- unclear source. Covid negative. Afebrile past 24 hours. -Con't monitoring cultures. Ok to deescalate cefepime to ceftriaxone and monitor given unknown source and negative cultures to date. Can reescalate if febrile again.  Acute hypoxic respiratory failure; CXR with atelectasis vs infiltrate RLL. Now off oxygen. -con't to monitor saturations on RA -OOB mobility -pulmonary hygiene  Paroxysmal Afib with RVR; new  onset, confirmed on EKG. Tele reviewed in the past day- no repeat episodes. -Metoprolol 50mg  BID, started this admission -Started Eliquis this admission; may benefit from cardiac evaluation as an OP to assess Afib burden. May see more when he is OOB and more active. -Continue telemetry monitoring  Prediabetes; mild hyperglycemia with minimal insulin requirements -Accu-Checks -SSI PRN -goal BG 140-180  Mild AS -OP follow up per PCP  Best Practice (right click and "Reselect all SmartList Selections" daily)   Diet/type: Regular consistency (see orders) DVT prophylaxis: DOAC  GI prophylaxis: N/A Lines: N/A Foley:  N/A Code Status:  full code Last date of multidisciplinary goals of care discussion [  ]  Labs   CBC: Recent Labs  Lab 09/22/20 1800 09/22/20 1809 09/23/20 0332  WBC 9.9  --  8.2  NEUTROABS 8.6*  --   --   HGB 15.5 15.6 14.1  HCT 43.5 46.0 38.1*  MCV 86.8  --  83.2  PLT 216  --  199     Basic Metabolic Panel: Recent Labs  Lab 09/22/20 1800 09/22/20 1809 09/22/20 2044 09/22/20 2157 09/23/20 0143 09/23/20 0332 09/23/20 0533 09/23/20 0827 09/23/20 0947 09/24/20 0211 09/24/20 0921 09/24/20 1203 09/24/20 1831 09/25/20 0040  NA 117* 118* 118* 122*   < > 120*   < > 123*   < > 125* 128* 127* 129* 129*  K 4.1 3.7 3.3*  --   --  3.5  --   --   --  3.7 4.5  --   --   --   CL 86* 85* 87*  --   --  90*  --   --   --  98 96*  --   --   --   CO2 20*  --  22  --   --  21*  --   --   --  21* 26  --   --   --   GLUCOSE 124* 127* 122*  --   --  118*  --   --   --  104* 105*  --   --   --   BUN 11 13 10   --   --  12  --   --   --  16 15  --   --   --   CREATININE 0.97 0.90 0.93  --   --  0.97  --   --   --  0.89 0.97  --   --   --   CALCIUM 8.2*  --  7.7*  --   --  7.7*  --   --   --  7.4* 7.8*  --   --   --   MG  --   --   --  1.4*  --   --   --  1.9  --  2.3  --   --   --   --   PHOS  --   --   --   --   --   --   --  2.1*  --  2.8  --   --   --   --    < > =  values in this interval not displayed.    GFR: Estimated Creatinine Clearance: 81.2 mL/min (by C-G formula based on SCr of 0.97 mg/dL).   This patient is critically ill with multiple organ system failure which requires frequent high complexity decision making, assessment, support, evaluation, and titration of therapies. This was completed through the application of advanced monitoring technologies and extensive interpretation of multiple databases. During this encounter critical care time was devoted to patient care services described in this note for 33 minutes.   Steffanie Dunn, DO 09/25/20 8:56 AM [facility]

## 2020-09-25 NOTE — Progress Notes (Signed)
PT has a driver/security detail that needs to be contacted to arrange transportation home with his discharge.   Primary contact is Marty Heck [phone] Secondary Contact is Jatavian Calica [phone].

## 2020-09-25 NOTE — Progress Notes (Signed)
Physical Therapy Treatment & Discharge Patient Details Name: Alexander Pierce MRN: [id number] DOB: [birth year] Today's Date: 09/25/2020    History of Present Illness Pt is a 70 yo male admitted with fever, confusion, and aphasia.  Pt with EEG showing diffuse encephalopathy.  CT-.  MRI negative. PMH: allergic rhinits, HLD, osteoarthritis    PT Comments    Patient functioning at modI level for mobility. Patient and wife reports patient being back to baseline functionally. Patient able to perform head turns, obstacle negotiation, turns, and sudden stops with no LOB. No further skilled PT needs required acutely. No PT follow up recommended at this time.     Follow Up Recommendations  No PT follow up     Equipment Recommendations  None recommended by PT    Recommendations for Other Services       Precautions / Restrictions Precautions Precautions: None Restrictions Weight Bearing Restrictions: No    Mobility  Bed Mobility Overal bed mobility: Modified Independent                  Transfers Overall transfer level: Modified independent Equipment used: None                Ambulation/Gait Ambulation/Gait assistance: Modified independent (Device/Increase time) Gait Distance (Feet): 300 Feet Assistive device: None Gait Pattern/deviations: WFL(Within Functional Limits)   Gait velocity interpretation: >4.37 ft/sec, indicative of normal walking speed     Stairs             Wheelchair Mobility    Modified Rankin (Stroke Patients Only)       Balance Overall balance assessment: Modified Independent                           High level balance activites: Direction changes;Turns;Sudden stops;Head turns High Level Balance Comments: stepping over obstacles and around            Cognition Arousal/Alertness: Awake/alert Behavior During Therapy: WFL for tasks assessed/performed Overall Cognitive Status: Within Functional Limits for tasks  assessed                                        Exercises      General Comments        Pertinent Vitals/Pain Pain Assessment: No/denies pain    Home Living Family/patient expects to be discharged to:: Private residence Living Arrangements: Spouse/significant other Available Help at Discharge: Family Type of Home: House              Prior Function            PT Goals (current goals can now be found in the care plan section) Acute Rehab PT Goals Patient Stated Goal: to go home PT Goal Formulation: All assessment and education complete, DC therapy Additional Goals Additional Goal #1:  (Met 9/8) Progress towards PT goals: Goals met/education completed, patient discharged from PT    Frequency    Min 4X/week      PT Plan Current plan remains appropriate    Co-evaluation              AM-PAC PT "6 Clicks" Mobility   Outcome Measure  Help needed turning from your back to your side while in a flat bed without using bedrails?: None Help needed moving from lying on your back to sitting on the side of a flat bed  without using bedrails?: None Help needed moving to and from a bed to a chair (including a wheelchair)?: None Help needed standing up from a chair using your arms (e.g., wheelchair or bedside chair)?: None Help needed to walk in hospital room?: None Help needed climbing 3-5 steps with a railing? : None 6 Click Score: 24    End of Session   Activity Tolerance: Patient tolerated treatment well Patient left: in chair;with call bell/phone within reach;with family/visitor present Nurse Communication: Mobility status PT Visit Diagnosis: Muscle weakness (generalized) (M62.81)     Time: 7530-0511 PT Time Calculation (min) (ACUTE ONLY): 19 min  Charges:  $Gait Training: 8-22 mins                     Xhaiden Coombs A. Gilford Rile PT, DPT Acute Rehabilitation Services Pager [phone] Office [phone]    Linna Hoff 09/25/2020, 12:55  PM

## 2020-09-25 NOTE — TOC CAGE-AID Note (Signed)
Transition of Care [facility]) - CAGE-AID Screening   Patient Details  Name: Alexander Pierce MRN: [id number] Date of Birth: 18-Mar-1950  Transition of Care [facility]) CM/SW Contact:    Erin Sons, LCSW Phone Number: 09/25/2020, 1:36 PM   Clinical Narrative:  CAGE-AID completed; score of 0. Pt reports he does not use alcohol or any other substances.   CAGE-AID Screening:    Have You Ever Felt You Ought to Cut Down on Your Drinking or Drug Use?: No Have People Annoyed You By Critizing Your Drinking Or Drug Use?: No Have You Felt Bad Or Guilty About Your Drinking Or Drug Use?: No Have You Ever Had a Drink or Used Drugs First Thing In The Morning to Steady Your Nerves or to Get Rid of a Hangover?: No CAGE-AID Score: 0  Substance Abuse Education Offered: No

## 2020-09-25 NOTE — Progress Notes (Signed)
STROKE TEAM PROGRESS NOTE   SUBJECTIVE (INTERVAL HISTORY) His wife is at the bedside.  Patient lying in bed, no acute event overnight, no fever, no mental status changes, blood culture negative so far, sodium 132.  Will DC 3% saline, put on salt tablet.  Currently on Rocephin, will change to p.o. antibiotic tomorrow to finish the 7-day course.   OBJECTIVE Temp:  [97.9 F (36.6 C)-99.9 F (37.7 C)] 97.9 F (36.6 C) (09/08 1700) Pulse Rate:  [61-87] 67 (09/08 1800) Cardiac Rhythm: Normal sinus rhythm (09/08 1036) Resp:  [14-26] 22 (09/08 1800) BP: (115-150)/(62-91) 139/75 (09/08 1800) SpO2:  [90 %-96 %] 93 % (09/08 1800)  Recent Labs  Lab 09/24/20 2315 09/25/20 0319 09/25/20 0737 09/25/20 1234 09/25/20 1531  GLUCAP 104* 105* 102* 138* 111*   Recent Labs  Lab 09/22/20 2044 09/22/20 2157 09/23/20 0143 09/23/20 0332 09/23/20 0533 09/23/20 0827 09/23/20 0865 09/24/20 0211 09/24/20 0921 09/24/20 1203 09/24/20 1831 09/25/20 0040 09/25/20 0637 09/25/20 1553  NA 118* 122*   < > 120*   < > 123*   < > 125* 128* 127* 129* 129* 132* 130*  K 3.3*  --   --  3.5  --   --   --  3.7 4.5  --   --   --   --  2.8*  CL 87*  --   --  90*  --   --   --  98 96*  --   --   --   --  95*  CO2 22  --   --  21*  --   --   --  21* 26  --   --   --   --  27  GLUCOSE 122*  --   --  118*  --   --   --  104* 105*  --   --   --   --  94  BUN 10  --   --  12  --   --   --  16 15  --   --   --   --  8  CREATININE 0.93  --   --  0.97  --   --   --  0.89 0.97  --   --   --   --  0.76  CALCIUM 7.7*  --   --  7.7*  --   --   --  7.4* 7.8*  --   --   --   --  7.6*  MG  --  1.4*  --   --   --  1.9  --  2.3  --   --   --   --   --   --   PHOS  --   --   --   --   --  2.1*  --  2.8  --   --   --   --   --   --    < > = values in this interval not displayed.   Recent Labs  Lab 09/22/20 1800  AST 34  ALT 24  ALKPHOS 64  BILITOT 1.0  PROT 7.2  ALBUMIN 3.2*   Recent Labs  Lab 09/22/20 1800  09/22/20 1809 09/23/20 0332 09/25/20 1319  WBC 9.9  --  8.2 7.0  NEUTROABS 8.6*  --   --   --   HGB 15.5 15.6 14.1 12.3*  HCT 43.5 46.0 38.1* 36.0*  MCV 86.8  --  83.2 87.2  PLT 216  --  199 211   No results for input(s): CKTOTAL, CKMB, CKMBINDEX, TROPONINI in the last 168 hours. No results for input(s): LABPROT, INR in the last 72 hours.  Recent Labs    09/22/20 2013 09/22/20 2146  COLORURINE YELLOW YELLOW  LABSPEC >1.046* >1.046*  PHURINE 5.0 5.0  GLUCOSEU NEGATIVE NEGATIVE  HGBUR MODERATE* MODERATE*  BILIRUBINUR NEGATIVE NEGATIVE  KETONESUR 20* 20*  PROTEINUR 100* 100*  NITRITE NEGATIVE NEGATIVE  LEUKOCYTESUR NEGATIVE NEGATIVE       Component Value Date/Time   CHOL 149 09/23/2020 0143   TRIG 53 09/23/2020 0143   HDL 42 09/23/2020 0143   CHOLHDL 3.5 09/23/2020 0143   VLDL 11 09/23/2020 0143   LDLCALC 96 09/23/2020 0143   Lab Results  Component Value Date   HGBA1C 5.9 (H) 09/23/2020   No results found for: LABOPIA, COCAINSCRNUR, LABBENZ, AMPHETMU, THCU, LABBARB  Recent Labs  Lab 09/22/20 2147  ETH <10    I have personally reviewed the radiological images below and agree with the radiology interpretations.  CT HEAD WO CONTRAST ( )  Result Date: 09/22/2020 CLINICAL DATA:  Follow-up examination for acute stroke, waxing and waning dysarthria, right-sided weakness. EXAM: CT HEAD WITHOUT CONTRAST TECHNIQUE: Contiguous axial images were obtained from the base of the skull through the vertex without intravenous contrast. COMPARISON:  CT from earlier the same day. FINDINGS: Brain: Cerebral volume within normal limits. No acute intracranial hemorrhage. No visible acute large vessel territory infarct. No mass lesion, midline shift or mass effect. No hydrocephalus or extra-axial fluid collection. Vascular: No asymmetric hyperdense vessel. Skull: Scalp soft tissues and calvarium within normal limits. Sinuses/Orbits: Globes and orbital soft tissues demonstrate no acute  finding. Mild scattered mucosal thickening noted throughout the paranasal sinuses. Mastoid air cells remain clear. Other: None. IMPRESSION: Stable head CT.  No acute intracranial abnormality identified. Electronically Signed   By: Rise Mu M.D.   On: 09/22/2020 22:18   MR BRAIN W WO CONTRAST  Result Date: 09/23/2020 CLINICAL DATA:  Stroke follow-up.  Aphasia. EXAM: MRI HEAD WITHOUT AND WITH CONTRAST TECHNIQUE: Multiplanar, multiecho pulse sequences of the brain and surrounding structures were obtained without and with intravenous contrast. CONTRAST:  9mL GADAVIST GADOBUTROL 1 MMOL/ML IV SOLN COMPARISON:  Head CT 09/22/2020 and MRI 04/19/2020 FINDINGS: The study is mildly motion degraded. Brain: There is no evidence of an acute infarct, intracranial hemorrhage, mass, midline shift, or extra-axial fluid collection. Small T2 hyperintensities in the cerebral white matter and pons are more conspicuous than on the prior MRI and are nonspecific but compatible with mild chronic small vessel ischemic disease. Mild cerebral atrophy is within normal limits for age. No abnormal enhancement is identified. Vascular: Major intracranial vascular flow voids are preserved. Skull and upper cervical spine: Unremarkable bone marrow signal. Sinuses/Orbits: Unremarkable orbits. Small mucous retention cyst in the right maxillary sinus. Clear mastoid air cells. Other: None. IMPRESSION: 1. No acute intracranial abnormality. 2. Mild chronic small vessel ischemic disease. Electronically Signed   By: Sebastian Ache M.D.   On: 09/23/2020 19:10   DG Chest Port 1 View  Result Date: 09/22/2020 CLINICAL DATA:  Questionable sepsis EXAM: PORTABLE CHEST 1 VIEW COMPARISON:  06/03/2020 FINDINGS: Cardiomegaly. Mild vascular congestion. Bibasilar airspace opacities, bibasilar airspace opacities. No effusions or overt edema. No acute bony abnormality. IMPRESSION: Cardiomegaly, vascular congestion. Bibasilar atelectasis or infiltrates.  Electronically Signed   By: Charlett Nose M.D.   On: 09/22/2020 19:19   EEG adult  Result  Date: 09/23/2020 Charlsie Quest, MD     09/23/2020 10:27 AM Patient Name: Alexander Pierce MRN: [id number] Epilepsy Attending: Charlsie Quest Referring Physician/Provider: Dr Marvel Plan Date: 09/23/2020 Duration: 22.21 mins Patient history: 70 y.o. male presenting with acute onset of expressive and receptive aphasia. EEG to evaluate for seizures. Level of alertness: Awake, drowsy AEDs during EEG study: None Technical aspects: This EEG study was done with scalp electrodes positioned according to the 10-20 International system of electrode placement. Electrical activity was acquired at a sampling rate of  and reviewed with a high frequency filter of  and a low frequency filter of . EEG data were recorded continuously and digitally stored. Description: The posterior dominant rhythm consists of 7.5 Hz activity of moderate voltage (25-35 uV) seen predominantly in posterior head regions, symmetric and reactive to eye opening and eye closing. Drowsiness was characterized by attenuation of the posterior background rhythm. EEG showed intermittent generalized 3 to 6 Hz theta-delta slowing. Hyperventilation and photic stimulation were not performed.   ABNORMALITY - Intermittent slow, generalized IMPRESSION: This study is suggestive of mild diffuse encephalopathy, nonspecific etiology. No seizures or epileptiform discharges were seen throughout the recording. Charlsie Quest   ECHOCARDIOGRAM COMPLETE  Result Date: 09/23/2020    ECHOCARDIOGRAM REPORT   Patient Name:   Alexander Pierce Date of Exam: 09/23/2020 Medical Rec #:  [id number]     Height:       74.0 in Accession #:    [id number]    Weight:       212.1 lb Date of Birth:  03-17-1950     BSA:          2.228 m Patient Age:    69 years      BP:           138/83 mmHg Patient Gender: M             HR:           100 bpm. Exam Location:  Inpatient Procedure: 2D Echo, Cardiac  Doppler and Color Doppler REPORT CONTAINS CRITICAL RESULT  Reported to: Dr Delane Ginger. Indications:    Fever  History:        Patient has no prior history of Echocardiogram examinations.                 Risk Factors:Dyslipidemia.  Sonographer:    Neomia Dear RDCS Referring Phys: [id number] LAURA R GLEASON IMPRESSIONS  1. Left ventricular ejection fraction, by estimation, is 45 to 50%. The left ventricle has mildly decreased function. The left ventricle has no regional wall motion abnormalities. Left ventricular diastolic function could not be evaluated.  2. Right ventricular systolic function is normal. The right ventricular size is normal. There is normal pulmonary artery systolic pressure.  3. The mitral valve is grossly normal. Trivial mitral valve regurgitation.  4. 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 The aortic valve is calcified. Aortic valve regurgitation is trivial. Mild aortic valve stenosis.  5. Aortic dilatation noted. 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 FINDINGS  Left Ventricle: Left ventricular ejection fraction, by estimation, is 45 to 50%. The left ventricle has mildly decreased function. The left ventricle has no regional wall motion abnormalities. The left ventricular internal cavity size was normal in size. There is no left ventricular hypertrophy. Left ventricular diastolic function could not be evaluated due to atrial fibrillation. Left ventricular diastolic function could not be evaluated. Right Ventricle: The right  ventricular size is normal. Right vetricular wall thickness was not well visualized. Right ventricular systolic function is normal. There is normal pulmonary artery systolic pressure. The tricuspid regurgitant velocity is 2.55 m/s, and with an assumed right atrial pressure of 3 mmHg, the estimated right ventricular systolic pressure is 29.0 mmHg. Left Atrium: Left atrial size was normal in size. Right Atrium: Right atrial size was normal in size.  Pericardium: There is no evidence of pericardial effusion. Mitral Valve: The mitral valve is grossly normal. Trivial mitral valve regurgitation. Tricuspid Valve: The tricuspid valve is grossly normal. Tricuspid valve regurgitation is trivial. 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 The aortic valve is calcified. Aortic valve regurgitation is trivial. Aortic regurgitation PHT measures 345 msec. Mild aortic stenosis is present. Aortic valve mean gradient measures 13.7 mmHg. Aortic valve peak gradient measures 23.7 mmHg. Aortic valve area, by VTI measures 2.24 cm. Pulmonic Valve: The pulmonic valve was normal in structure. Pulmonic valve regurgitation is not visualized. Aorta: Aortic dilatation noted. 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 IAS/Shunts: The atrial septum is grossly normal.  LEFT VENTRICLE PLAX 2D LVIDd:         5.10 cm LVIDs:         3.80 cm LV PW:         1.10 cm LV IVS:        1.30 cm LVOT diam:     2.60 cm LV SV:         75 LV SV Index:   34 LVOT Area:     5.31 cm  LV Volumes (MOD) LV vol d, MOD A2C: 68.0 ml LV vol d, MOD A4C: 108.0 ml LV vol s, MOD A2C: 49.1 ml LV vol s, MOD A4C: 55.2 ml LV SV MOD A2C:     18.9 ml LV SV MOD A4C:     108.0 ml LV SV MOD BP:      32.7 ml RIGHT VENTRICLE RV Basal diam:  2.10 cm RV Mid diam:    1.20 cm RV S prime:     12.90 cm/s TAPSE (M-mode): 2.2 cm LEFT ATRIUM             Index       RIGHT ATRIUM           Index LA Vol (A2C):   53.3 ml 23.92 ml/m RA Area:     15.40 cm LA Vol (A4C):   51.7 ml 23.20 ml/m RA Volume:   33.30 ml  14.94 ml/m LA Biplane Vol: 52.6 ml 23.61 ml/m  AORTIC VALVE                    PULMONIC VALVE AV Area (Vmax):    1.98 cm     PV Vmax:       1.05 m/s AV Area (Vmean):   1.91 cm     PV Vmean:      67.300 cm/s AV Area (VTI):     2.24 cm     PV VTI:        0.142 m AV Vmax:           243.33 cm/s  PV Peak grad:  4.4 mmHg AV Vmean:          170.000 cm/s PV Mean grad:  2.0 mmHg AV VTI:            0.334 m AV  Peak Grad:      23.7 mmHg  AV Mean Grad:      13.7 mmHg LVOT Vmax:         90.70 cm/s LVOT Vmean:        61.100 cm/s LVOT VTI:          0.141 m LVOT/AV VTI ratio: 0.42 AI PHT:            345 msec  AORTA Ao Root diam: 3.70 cm Ao Asc diam:  5.40 cm MR Peak grad: 109.4 mmHg  TRICUSPID VALVE MR Mean grad: 76.0 mmHg   TR Peak grad:   26.0 mmHg MR Vmax:      523.00 cm/s TR Vmax:        255.00 cm/s MR Vmean:     408.0 cm/s                           SHUNTS                           Systemic VTI:  0.14 m                           Systemic Diam: 2.60 cm Kristeen Miss MD Electronically signed by Kristeen Miss MD Signature Date/Time: 09/23/2020/1:03:36 PM    Final    CT HEAD CODE STROKE WO CONTRAST  Result Date: 09/22/2020 CLINICAL DATA:  Code stroke. Neuro deficit, acute, stroke suspected. Additional history provided: Last known well 1430, aphasia, LVO score 4. EXAM: CT HEAD WITHOUT CONTRAST TECHNIQUE: Contiguous axial images were obtained from the base of the skull through the vertex without intravenous contrast. COMPARISON:  Brain MRI 04/19/2020. Noncontrast head CT 04/19/2020. CT angiogram head/neck 04/19/2020. FINDINGS: Brain: Cerebral volume is normal. There is no acute intracranial hemorrhage. No demarcated cortical infarct. No extra-axial fluid collection. No evidence of an intracranial mass. No midline shift. Vascular: No hyperdense vessel. Skull: Normal. Negative for fracture or focal lesion. Sinuses/Orbits: Visualized orbits show no acute finding. Postsurgical appearance of the paranasal sinuses. Mild scattered paranasal sinus mucosal thickening at the imaged levels. Small bilateral maxillary sinus mucous retention cysts. ASPECTS ([place] Stroke Program Early CT Score) - Ganglionic level infarction (caudate, lentiform nuclei, internal capsule, insula, M1-M3 cortex): 7 - Supraganglionic infarction (M4-M6 cortex): 3 Total score (0-10 with 10 being normal): 10 These results were communicated to Dr. Otelia Limes at [facility] pmon  9/5/2022by text page via the [facility] messaging system. IMPRESSION: No evidence of acute intracranial abnormality. Paranasal sinus disease, as described. Electronically Signed   By: Jackey Loge D.O.   On: 09/22/2020 18:12   VAS [place] LOWER EXTREMITY VENOUS (DVT)  Result Date: 09/23/2020  Lower Venous DVT Study Patient Name:  Alexander Pierce  Date of Exam:   09/23/2020 Medical Rec #: [id number]      Accession #:    [id number] Date of Birth: 1950/02/25      Patient Gender: M Patient Age:   30 years Exam Location:  [facility] Procedure:      VAS [place] LOWER EXTREMITY VENOUS (DVT) Referring Phys: Scheryl Marten Cledith Kamiya --------------------------------------------------------------------------------  Indications: Stroke.  Comparison Study: No prior studies. Performing Technologist: Jean Rosenthal RDMS, RVT  Examination Guidelines: A complete evaluation includes B-mode imaging, spectral Doppler, color Doppler, and power Doppler as needed of all accessible portions of each vessel. Bilateral testing is considered an integral part of a complete examination. Limited examinations for reoccurring indications may be performed as noted.  The reflux portion of the exam is performed with the patient in reverse Trendelenburg.  +---------+---------------+---------+-----------+----------+--------------+ RIGHT    CompressibilityPhasicitySpontaneityPropertiesThrombus Aging +---------+---------------+---------+-----------+----------+--------------+ CFV      Full           Yes      Yes                                 +---------+---------------+---------+-----------+----------+--------------+ SFJ      Full                                                        +---------+---------------+---------+-----------+----------+--------------+ FV Prox  Full                                                        +---------+---------------+---------+-----------+----------+--------------+ FV Mid   Full                                                         +---------+---------------+---------+-----------+----------+--------------+ FV DistalFull                                                        +---------+---------------+---------+-----------+----------+--------------+ PFV      Full                                                        +---------+---------------+---------+-----------+----------+--------------+ POP      Full           Yes      Yes                                 +---------+---------------+---------+-----------+----------+--------------+ PTV      Full                                                        +---------+---------------+---------+-----------+----------+--------------+ PERO     Full                                                        +---------+---------------+---------+-----------+----------+--------------+ Gastroc  Full                                                        +---------+---------------+---------+-----------+----------+--------------+   +---------+---------------+---------+-----------+----------+--------------+  LEFT     CompressibilityPhasicitySpontaneityPropertiesThrombus Aging +---------+---------------+---------+-----------+----------+--------------+ CFV      Full           Yes      Yes                                 +---------+---------------+---------+-----------+----------+--------------+ SFJ      Full                                                        +---------+---------------+---------+-----------+----------+--------------+ FV Prox  Full                                                        +---------+---------------+---------+-----------+----------+--------------+ FV Mid   Full                                                        +---------+---------------+---------+-----------+----------+--------------+ FV DistalFull                                                         +---------+---------------+---------+-----------+----------+--------------+ PFV      Full                                                        +---------+---------------+---------+-----------+----------+--------------+ POP      Full           Yes      Yes                                 +---------+---------------+---------+-----------+----------+--------------+ PTV      Full                                                        +---------+---------------+---------+-----------+----------+--------------+ PERO     Full                                                        +---------+---------------+---------+-----------+----------+--------------+ Gastroc  Full                                                        +---------+---------------+---------+-----------+----------+--------------+  Summary: RIGHT: - There is no evidence of deep vein thrombosis in the lower extremity.  - No cystic structure found in the popliteal fossa.  LEFT: - There is no evidence of deep vein thrombosis in the lower extremity.  - No cystic structure found in the popliteal fossa.  *See table(s) above for measurements and observations. Electronically signed by Coral Else MD on 09/23/2020 at 7:17:39 PM.    Final    CT ANGIO HEAD NECK W WO CM W PERF (CODE STROKE)  Result Date: 09/22/2020 CLINICAL DATA:  Neuro deficit, acute, stroke suspected. EXAM: CT ANGIOGRAPHY HEAD AND NECK CT PERFUSION BRAIN TECHNIQUE: Multidetector CT imaging of the head and neck was performed using the standard protocol during bolus administration of intravenous contrast. Multiplanar CT image reconstructions and MIPs were obtained to evaluate the vascular anatomy. Carotid stenosis measurements (when applicable) are obtained utilizing NASCET criteria, using the distal internal carotid diameter as the denominator. Multiphase CT imaging of the brain was performed following IV bolus contrast injection. Subsequent parametric  perfusion maps were calculated using RAPID software. CONTRAST:  Administered contrast not known at this time. COMPARISON:  Noncontrast head CT performed earlier today 09/22/2020. Brain MRI 04/19/2020. CT angiogram head/neck 04/19/2020. FINDINGS: CTA NECK FINDINGS Aortic arch: Standard aortic branching. Atherosclerotic plaque within the visualized aortic arch. Streak artifact from a dense left-sided contrast bolus partially obscures the left subclavian artery. Within this limitation, there is no appreciable hemodynamically significant stenosis within the innominate or proximal subclavian arteries. Right carotid system: CCA and ICA patent within the neck without stenosis. Minimal atherosclerotic plaque within the carotid bifurcation. Left carotid system: CCA and ICA patent within the neck without stenosis. Mild soft and calcified plaque within the carotid bifurcation and proximal ICA. Vertebral arteries: Beam hardening artifact from a dense left-sided contrast bolus partially obscures the proximal left vertebral artery. Within this limitation, the vertebral arteries are codominant and patent within the neck without stenosis. Skeleton: Cervical spondylosis. Fusion across the C5-C6 disc space with associated left-sided facet joint ankylosis at this level. No acute bony abnormality or aggressive osseous lesion. Other neck: No neck mass or cervical lymphadenopathy. Upper chest: No consolidation within the imaged lung apices. Review of the MIP images confirms the above findings CTA HEAD FINDINGS Anterior circulation: The intracranial internal carotid arteries are patent. No M2 proximal branch occlusion or high-grade proximal stenosis is identified. The anterior cerebral arteries are patent. No intracranial aneurysm is identified. Posterior circulation: The intracranial vertebral arteries are patent. The basilar artery is patent. The posterior cerebral arteries are patent. The posterior cerebral arteries are patent.  Posterior communicating arteries are hypoplastic or absent bilaterally. Venous sinuses: Within the limitations of contrast timing, no convincing thrombus. Anatomic variants: As described Review of the MIP images confirms the above findings CT Brain Perfusion Findings: The CT perfusion post processing failed. In reviewing the source imaging, this is likely due to patient motion. No large vessel occlusion identified. These results were called by telephone at the time of interpretation on 09/22/2020 at 6:40 pm to provider ERIC [facility] , who verbally acknowledged these results. IMPRESSION: CTA neck: Streak and beam hardening artifact arising from a dense left-sided contrast bolus partially obscures the proximal left vertebral artery. Within this limitation, the common carotid, internal carotid and vertebral arteries are patent within the neck without stenosis. Mild atherosclerotic plaque within the bilateral carotid systems within the neck, as described. CTA head: No intracranial large vessel occlusion or proximal high-grade arterial stenosis identified. CT perfusion head: Failed CT  perfusion, likely due to patient motion. Electronically Signed   By: Jackey Loge D.O.   On: 09/22/2020 18:51     PHYSICAL EXAM  Temp:  [97.9 F (36.6 C)-99.9 F (37.7 C)] 97.9 F (36.6 C) (09/08 1700) Pulse Rate:  [61-87] 67 (09/08 1800) Resp:  [14-26] 22 (09/08 1800) BP: (115-150)/(62-91) 139/75 (09/08 1800) SpO2:  [90 %-96 %] 93 % (09/08 1800)  General - Well nourished, well developed, in no apparent distress.  Ophthalmologic - fundi not visualized due to noncooperation.  Cardiovascular - Regular rhythm and rate.  Mental Status -  Level of arousal and orientation to time, place, and person were intact. Language including expression, naming, repetition, comprehension was assessed and found intact. No apraxia Able to do backward spelling of WORLD, serial 7 correct 2/5 Registration 3/3, and delayed recall 3/3 Fund of  Knowledge was assessed and was intact.  Cranial Nerves II - XII - II - Visual field intact OU. III, IV, VI - Extraocular movements intact. V - Facial sensation intact bilaterally. VII - Facial movement intact bilaterally. VIII - Hearing & vestibular intact bilaterally. X - Palate elevates symmetrically. XI - Chin turning & shoulder shrug intact bilaterally. XII - Tongue protrusion intact.  Motor Strength - The patient's strength was normal in all extremities and pronator drift was absent.  Bulk was normal and fasciculations were absent.   Motor Tone - Muscle tone was assessed at the neck and appendages and was normal.  Reflexes - The patient's reflexes were symmetrical in all extremities and he had no pathological reflexes.  Sensory - Light touch, temperature/pinprick were assessed and were symmetrical.    Coordination - The patient had normal movements in the hands and feet with no ataxia or dysmetria.  Tremor was absent.  Gait and Station - deferred.   ASSESSMENT/PLAN Alexander Pierce is a 70 y.o. male with history of HLD, vertigo episode in 04/2020 admitted for aphasia, confusion and fever.  Status post TNK.  TIA vs. Seizure 2/2 hyponatremia vs. Encephalopathy 2/2 fever. Less likely encephalitis CT head negative CTA head neck negative LE venous Doppler negative for DVT MRI with and without contrast neg 2D Echo EF 45 to 50% EEG mild diffuse encephalopathy, no seizure LDL 96 HgbA1c 5.9 SCDs for VTE prophylaxis No antithrombotic prior to admission, now on eliquis  bid Therapy recommendations: Outpatient PT OT Disposition: Pending  Fever, unclear origin T-max 103-> afebrile->100.1->100.8->afebrile Blood culture NGTD Urine culture neg UA WBC 6-10 CXR bibasilar atelectasis or infiltrates COVID PCR negative Given significant neuro improvement, less likely encephalitis, will hold off LP  Given significant improvement, negative 2D echo, no elective food of endocarditis,  will hold off TEE unless blood culture positive On cefepime and vancomycin -> cefepime ->rocephin  Hyponatremia Na 117->118->122->123->124->125->128->132 In 04/2020 Na 134->132 On 3% saline @ 50->30->50->off EEG no seizure Forearm salt 2 g tablet Fluid restriction 1200cc  PAF Intermittent A. fib on telemetry-> persistent now Captured on EKG On eliquis  bid now  Hypertension Stable Long term BP goal normotensive  Hyperlipidemia Home meds: None LDL 96, goal < 70 Now on Lipitor 20 Continue statin at discharge  Other Stroke Risk Factors Advanced age  Other Active Problems Hypokalemia K 3.3 -> 4.5->2.8 - supplement  Hospital day # 3  This patient is critically ill due to strokelike symptoms status post TN K, fever of unknown origin, profound hyponatremia, hypokalemia, paroxysmal A. fib and at significant risk of neurological worsening, death form recurrent stroke, heart failure, sepsis,  hyponatremia, seizure. This patient's care requires constant monitoring of vital signs, hemodynamics, respiratory and cardiac monitoring, review of multiple databases, neurological assessment, discussion with family, other specialists and medical decision making of high complexity. I spent 35 minutes of neurocritical care time in the care of this patient. I had long discussion with wife and patient at bedside, updated pt current condition, treatment plan and potential prognosis, and answered all the questions.  They expressed understanding and appreciation.    Marvel Plan, MD PhD Stroke Neurology 09/25/2020 7:06 PM    To contact Stroke Continuity provider, please refer to [email or web address]. After hours, contact General Neurology

## 2020-09-26 ENCOUNTER — Other Ambulatory Visit (HOSPITAL_COMMUNITY): Payer: Self-pay

## 2020-09-26 LAB — CBC
HCT: 34 % — ABNORMAL LOW (ref 39.0–52.0)
Hemoglobin: 12.1 g/dL — ABNORMAL LOW (ref 13.0–17.0)
MCH: 30.4 pg (ref 26.0–34.0)
MCHC: 35.6 g/dL (ref 30.0–36.0)
MCV: 85.4 fL (ref 80.0–100.0)
Platelets: 245 10*3/uL (ref 150–400)
RBC: 3.98 MIL/uL — ABNORMAL LOW (ref 4.22–5.81)
RDW: 13.2 % (ref 11.5–15.5)
WBC: 6.6 10*3/uL (ref 4.0–10.5)
nRBC: 0 % (ref 0.0–0.2)

## 2020-09-26 LAB — BASIC METABOLIC PANEL
Anion gap: 5 (ref 5–15)
BUN: 8 mg/dL (ref 8–23)
CO2: 28 mmol/L (ref 22–32)
Calcium: 7.5 mg/dL — ABNORMAL LOW (ref 8.9–10.3)
Chloride: 96 mmol/L — ABNORMAL LOW (ref 98–111)
Creatinine, Ser: 0.94 mg/dL (ref 0.61–1.24)
GFR, Estimated: >60 mL/min (ref >60)
Glucose, Bld: 144 mg/dL — ABNORMAL HIGH (ref 70–99)
Potassium: 3.1 mmol/L — ABNORMAL LOW (ref 3.5–5.1)
Sodium: 129 mmol/L — ABNORMAL LOW (ref 135–145)

## 2020-09-26 LAB — GLUCOSE, CAPILLARY
Glucose-Capillary: 100 mg/dL — ABNORMAL HIGH (ref 70–99)
Glucose-Capillary: 124 mg/dL — ABNORMAL HIGH (ref 70–99)
Glucose-Capillary: 128 mg/dL — ABNORMAL HIGH (ref 70–99)
Glucose-Capillary: 131 mg/dL — ABNORMAL HIGH (ref 70–99)
Glucose-Capillary: 96 mg/dL (ref 70–99)
Glucose-Capillary: 96 mg/dL (ref 70–99)

## 2020-09-26 MED ORDER — POTASSIUM CHLORIDE CRYS ER 20 MEQ PO TBCR
40.0000 meq | EXTENDED_RELEASE_TABLET | ORAL | Status: AC
  Administered 2020-09-26 (×2): 40 meq via ORAL
  Filled 2020-09-26 (×2): qty 2

## 2020-09-26 MED ORDER — POTASSIUM CHLORIDE CRYS ER 20 MEQ PO TBCR
40.0000 meq | EXTENDED_RELEASE_TABLET | Freq: Once | ORAL | Status: AC
  Administered 2020-09-26: 40 meq via ORAL
  Filled 2020-09-26: qty 2

## 2020-09-26 MED ORDER — POTASSIUM CHLORIDE CRYS ER 20 MEQ PO TBCR: 40.0000 meq | EXTENDED_RELEASE_TABLET | ORAL | Status: DC

## 2020-09-26 NOTE — Plan of Care (Signed)

## 2020-09-26 NOTE — TOC Transition Note (Addendum)
Transition of Care [facility]) - CM/SW Discharge Note   Patient Details  Name: Alexander Pierce MRN: [id number] Date of Birth: [birth year]  [facility] CM/SW Contact:  Kermit Balo, RN Phone Number: 09/26/2020, 11:09 AM   Clinical Narrative:    Patient lives at home with his spouse that is with him most of the time. He denies any DME at home. He states he wasn't taking any medications prior to admission.  Recommendations are for outpatient but he and his spouse are refusing.  Pt states he can afford the $47 co pay for Eliquis. Pt provided 30 day free card for Eliquis. Pt has transport home.   Final next level of care: Home/Self Care Barriers to Discharge: No Barriers Identified   Patient Goals and CMS Choice        Discharge Placement                       Discharge Plan and Services                                     Social Determinants of Health (SDOH) Interventions     Readmission Risk Interventions No flowsheet data found.

## 2020-09-26 NOTE — Progress Notes (Addendum)
STROKE TEAM PROGRESS NOTE   SUBJECTIVE (INTERVAL HISTORY) His wife is at the bedside.  Patient lying in bed, no acute event overnight, no mental status changes, blood culture negative so far, sodium 129 today. However, he did have one time fever last night at 101. Per wife and pt, he did have intermittent cough for the last week. I discussed with Dr. Elinor ParkinsonManandhar ID and she felt pt fever curve is better, blood culture negative, on Abx, no further work up needed but would like pt stay overnight, if no fever for the next 24h, he can be discharged with close follow up with PCP.   OBJECTIVE Temp:  [97.9 F (36.6 C)-101 F (38.3 C)] 98.4 F (36.9 C) (09/09 1212) Pulse Rate:  [53-71] 53 (09/09 1212) Cardiac Rhythm: Normal sinus rhythm (09/09 0700) Resp:  [16-24] 20 (09/09 1212) BP: (121-145)/(64-87) 125/87 (09/09 1212) SpO2:  [90 %-96 %] 96 % (09/09 1212)  Recent Labs  Lab 09/25/20 2003 09/26/20 0005 09/26/20 0333 09/26/20 0813 09/26/20 1211  GLUCAP 111* 96 96 131* 100*   Recent Labs  Lab 09/22/20 2157 09/23/20 0143 09/23/20 0332 09/23/20 0533 09/23/20 0827 09/23/20 0947 09/24/20 0211 09/24/20 0921 09/24/20 1203 09/24/20 1831 09/25/20 0040 09/25/20 0637 09/25/20 1553 09/26/20 0145  NA 122*   < > 120*   < > 123*   < > 125* 128*   < > 129* 129* 132* 130* 129*  K  --   --  3.5  --   --   --  3.7 4.5  --   --   --   --  2.8* 3.1*  CL  --   --  90*  --   --   --  98 96*  --   --   --   --  95* 96*  CO2  --   --  21*  --   --   --  21* 26  --   --   --   --  27 28  GLUCOSE  --   --  118*  --   --   --  104* 105*  --   --   --   --  94 144*  BUN  --   --  12  --   --   --  16 15  --   --   --   --  8 8  CREATININE  --   --  0.97  --   --   --  0.89 0.97  --   --   --   --  0.76 0.94  CALCIUM  --   --  7.7*  --   --   --  7.4* 7.8*  --   --   --   --  7.6* 7.5*  MG 1.4*  --   --   --  1.9  --  2.3  --   --   --   --   --   --   --   PHOS  --   --   --   --  2.1*  --  2.8  --   --    --   --   --   --   --    < > = values in this interval not displayed.   Recent Labs  Lab 09/22/20 1800  AST 34  ALT 24  ALKPHOS 64  BILITOT 1.0  PROT 7.2  ALBUMIN 3.2*   Recent  Labs  Lab 09/22/20 1800 09/22/20 1809 09/23/20 0332 09/25/20 1319 09/26/20 0145  WBC 9.9  --  8.2 7.0 6.6  NEUTROABS 8.6*  --   --   --   --   HGB 15.5 15.6 14.1 12.3* 12.1*  HCT 43.5 46.0 38.1* 36.0* 34.0*  MCV 86.8  --  83.2 87.2 85.4  PLT 216  --  199 211 245   No results for input(s): CKTOTAL, CKMB, CKMBINDEX, TROPONINI in the last 168 hours. No results for input(s): LABPROT, INR in the last 72 hours.  No results for input(s): COLORURINE, LABSPEC, PHURINE, GLUCOSEU, HGBUR, BILIRUBINUR, KETONESUR, PROTEINUR, UROBILINOGEN, NITRITE, LEUKOCYTESUR in the last 72 hours.  Invalid input(s): APPERANCEUR      Component Value Date/Time   CHOL 149 09/23/2020 0143   TRIG 53 09/23/2020 0143   HDL 42 09/23/2020 0143   CHOLHDL 3.5 09/23/2020 0143   VLDL 11 09/23/2020 0143   LDLCALC 96 09/23/2020 0143   Lab Results  Component Value Date   HGBA1C 5.9 (H) 09/23/2020   No results found for: LABOPIA, COCAINSCRNUR, LABBENZ, AMPHETMU, THCU, LABBARB  Recent Labs  Lab 09/22/20 2147  ETH <10    I have personally reviewed the radiological images below and agree with the radiology interpretations.  CT HEAD WO CONTRAST ( )  Result Date: 09/22/2020 CLINICAL DATA:  Follow-up examination for acute stroke, waxing and waning dysarthria, right-sided weakness. EXAM: CT HEAD WITHOUT CONTRAST TECHNIQUE: Contiguous axial images were obtained from the base of the skull through the vertex without intravenous contrast. COMPARISON:  CT from earlier the same day. FINDINGS: Brain: Cerebral volume within normal limits. No acute intracranial hemorrhage. No visible acute large vessel territory infarct. No mass lesion, midline shift or mass effect. No hydrocephalus or extra-axial fluid collection. Vascular: No asymmetric  hyperdense vessel. Skull: Scalp soft tissues and calvarium within normal limits. Sinuses/Orbits: Globes and orbital soft tissues demonstrate no acute finding. Mild scattered mucosal thickening noted throughout the paranasal sinuses. Mastoid air cells remain clear. Other: None. IMPRESSION: Stable head CT.  No acute intracranial abnormality identified. Electronically Signed   By: Rise Mu M.D.   On: 09/22/2020 22:18   MR BRAIN W WO CONTRAST  Result Date: 09/23/2020 CLINICAL DATA:  Stroke follow-up.  Aphasia. EXAM: MRI HEAD WITHOUT AND WITH CONTRAST TECHNIQUE: Multiplanar, multiecho pulse sequences of the brain and surrounding structures were obtained without and with intravenous contrast. CONTRAST:  76mL GADAVIST GADOBUTROL 1 MMOL/ML IV SOLN COMPARISON:  Head CT 09/22/2020 and MRI 04/19/2020 FINDINGS: The study is mildly motion degraded. Brain: There is no evidence of an acute infarct, intracranial hemorrhage, mass, midline shift, or extra-axial fluid collection. Small T2 hyperintensities in the cerebral white matter and pons are more conspicuous than on the prior MRI and are nonspecific but compatible with mild chronic small vessel ischemic disease. Mild cerebral atrophy is within normal limits for age. No abnormal enhancement is identified. Vascular: Major intracranial vascular flow voids are preserved. Skull and upper cervical spine: Unremarkable bone marrow signal. Sinuses/Orbits: Unremarkable orbits. Small mucous retention cyst in the right maxillary sinus. Clear mastoid air cells. Other: None. IMPRESSION: 1. No acute intracranial abnormality. 2. Mild chronic small vessel ischemic disease. Electronically Signed   By: Sebastian Ache M.D.   On: 09/23/2020 19:10   DG Chest Port 1 View  Result Date: 09/22/2020 CLINICAL DATA:  Questionable sepsis EXAM: PORTABLE CHEST 1 VIEW COMPARISON:  06/03/2020 FINDINGS: Cardiomegaly. Mild vascular congestion. Bibasilar airspace opacities, bibasilar airspace  opacities. No effusions or overt  edema. No acute bony abnormality. IMPRESSION: Cardiomegaly, vascular congestion. Bibasilar atelectasis or infiltrates. Electronically Signed   By: Charlett Nose M.D.   On: 09/22/2020 19:19   EEG adult  Result Date: 09/23/2020 Charlsie Quest, MD     09/23/2020 10:27 AM Patient Name: JANTHONY HOLLEMAN MRN: [id number] Epilepsy Attending: Charlsie Quest Referring Physician/Provider: Dr Marvel Plan Date: 09/23/2020 Duration: 22.21 mins Patient history: 70 y.o. male presenting with acute onset of expressive and receptive aphasia. EEG to evaluate for seizures. Level of alertness: Awake, drowsy AEDs during EEG study: None Technical aspects: This EEG study was done with scalp electrodes positioned according to the 10-20 International system of electrode placement. Electrical activity was acquired at a sampling rate of  and reviewed with a high frequency filter of  and a low frequency filter of . EEG data were recorded continuously and digitally stored. Description: The posterior dominant rhythm consists of 7.5 Hz activity of moderate voltage (25-35 uV) seen predominantly in posterior head regions, symmetric and reactive to eye opening and eye closing. Drowsiness was characterized by attenuation of the posterior background rhythm. EEG showed intermittent generalized 3 to 6 Hz theta-delta slowing. Hyperventilation and photic stimulation were not performed.   ABNORMALITY - Intermittent slow, generalized IMPRESSION: This study is suggestive of mild diffuse encephalopathy, nonspecific etiology. No seizures or epileptiform discharges were seen throughout the recording. Charlsie Quest   ECHOCARDIOGRAM COMPLETE  Result Date: 09/23/2020    ECHOCARDIOGRAM REPORT   Patient Name:   CHAI ROUTH Date of Exam: 09/23/2020 Medical Rec #:  [id number]     Height:       74.0 in Accession #:    [id number]    Weight:       212.1 lb Date of Birth:  09-Aug-1950     BSA:          2.228 m Patient Age:     69 years      BP:           138/83 mmHg Patient Gender: M             HR:           100 bpm. Exam Location:  Inpatient Procedure: 2D Echo, Cardiac Doppler and Color Doppler REPORT CONTAINS CRITICAL RESULT  Reported to: Dr Delane Ginger. Indications:    Fever  History:        Patient has no prior history of Echocardiogram examinations.                 Risk Factors:Dyslipidemia.  Sonographer:    Neomia Dear RDCS Referring Phys: [id number] LAURA R GLEASON IMPRESSIONS  1. Left ventricular ejection fraction, by estimation, is 45 to 50%. The left ventricle has mildly decreased function. The left ventricle has no regional wall motion abnormalities. Left ventricular diastolic function could not be evaluated.  2. Right ventricular systolic function is normal. The right ventricular size is normal. There is normal pulmonary artery systolic pressure.  3. The mitral valve is grossly normal. Trivial mitral valve regurgitation.  4. 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 The aortic valve is calcified. Aortic valve regurgitation is trivial. Mild aortic valve stenosis.  5. Aortic dilatation noted. 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 FINDINGS  Left Ventricle: Left ventricular ejection fraction, by estimation, is 45 to 50%. The left ventricle has mildly decreased function. The left ventricle has no regional wall motion abnormalities. The left ventricular internal cavity size was  normal in size. There is no left ventricular hypertrophy. Left ventricular diastolic function could not be evaluated due to atrial fibrillation. Left ventricular diastolic function could not be evaluated. Right Ventricle: The right ventricular size is normal. Right vetricular wall thickness was not well visualized. Right ventricular systolic function is normal. There is normal pulmonary artery systolic pressure. The tricuspid regurgitant velocity is 2.55 m/s, and with an assumed right atrial pressure of 3 mmHg, the estimated right  ventricular systolic pressure is 29.0 mmHg. Left Atrium: Left atrial size was normal in size. Right Atrium: Right atrial size was normal in size. Pericardium: There is no evidence of pericardial effusion. Mitral Valve: The mitral valve is grossly normal. Trivial mitral valve regurgitation. Tricuspid Valve: The tricuspid valve is grossly normal. Tricuspid valve regurgitation is trivial. 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 The aortic valve is calcified. Aortic valve regurgitation is trivial. Aortic regurgitation PHT measures 345 msec. Mild aortic stenosis is present. Aortic valve mean gradient measures 13.7 mmHg. Aortic valve peak gradient measures 23.7 mmHg. Aortic valve area, by VTI measures 2.24 cm. Pulmonic Valve: The pulmonic valve was normal in structure. Pulmonic valve regurgitation is not visualized. Aorta: Aortic dilatation noted. 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 IAS/Shunts: The atrial septum is grossly normal.  LEFT VENTRICLE PLAX 2D LVIDd:         5.10 cm LVIDs:         3.80 cm LV PW:         1.10 cm LV IVS:        1.30 cm LVOT diam:     2.60 cm LV SV:         75 LV SV Index:   34 LVOT Area:     5.31 cm  LV Volumes (MOD) LV vol d, MOD A2C: 68.0 ml LV vol d, MOD A4C: 108.0 ml LV vol s, MOD A2C: 49.1 ml LV vol s, MOD A4C: 55.2 ml LV SV MOD A2C:     18.9 ml LV SV MOD A4C:     108.0 ml LV SV MOD BP:      32.7 ml RIGHT VENTRICLE RV Basal diam:  2.10 cm RV Mid diam:    1.20 cm RV S prime:     12.90 cm/s TAPSE (M-mode): 2.2 cm LEFT ATRIUM             Index       RIGHT ATRIUM           Index LA Vol (A2C):   53.3 ml 23.92 ml/m RA Area:     15.40 cm LA Vol (A4C):   51.7 ml 23.20 ml/m RA Volume:   33.30 ml  14.94 ml/m LA Biplane Vol: 52.6 ml 23.61 ml/m  AORTIC VALVE                    PULMONIC VALVE AV Area (Vmax):    1.98 cm     PV Vmax:       1.05 m/s AV Area (Vmean):   1.91 cm     PV Vmean:      67.300 cm/s AV Area (VTI):     2.24 cm     PV VTI:         0.142 m AV Vmax:           243.33 cm/s  PV Peak grad:  4.4 mmHg AV Vmean:  170.000 cm/s PV Mean grad:  2.0 mmHg AV VTI:            0.334 m AV Peak Grad:      23.7 mmHg AV Mean Grad:      13.7 mmHg LVOT Vmax:         90.70 cm/s LVOT Vmean:        61.100 cm/s LVOT VTI:          0.141 m LVOT/AV VTI ratio: 0.42 AI PHT:            345 msec  AORTA Ao Root diam: 3.70 cm Ao Asc diam:  5.40 cm MR Peak grad: 109.4 mmHg  TRICUSPID VALVE MR Mean grad: 76.0 mmHg   TR Peak grad:   26.0 mmHg MR Vmax:      523.00 cm/s TR Vmax:        255.00 cm/s MR Vmean:     408.0 cm/s                           SHUNTS                           Systemic VTI:  0.14 m                           Systemic Diam: 2.60 cm Kristeen Miss MD Electronically signed by Kristeen Miss MD Signature Date/Time: 09/23/2020/1:03:36 PM    Final    CT HEAD CODE STROKE WO CONTRAST  Result Date: 09/22/2020 CLINICAL DATA:  Code stroke. Neuro deficit, acute, stroke suspected. Additional history provided: Last known well 1430, aphasia, LVO score 4. EXAM: CT HEAD WITHOUT CONTRAST TECHNIQUE: Contiguous axial images were obtained from the base of the skull through the vertex without intravenous contrast. COMPARISON:  Brain MRI 04/19/2020. Noncontrast head CT 04/19/2020. CT angiogram head/neck 04/19/2020. FINDINGS: Brain: Cerebral volume is normal. There is no acute intracranial hemorrhage. No demarcated cortical infarct. No extra-axial fluid collection. No evidence of an intracranial mass. No midline shift. Vascular: No hyperdense vessel. Skull: Normal. Negative for fracture or focal lesion. Sinuses/Orbits: Visualized orbits show no acute finding. Postsurgical appearance of the paranasal sinuses. Mild scattered paranasal sinus mucosal thickening at the imaged levels. Small bilateral maxillary sinus mucous retention cysts. ASPECTS ([place] Stroke Program Early CT Score) - Ganglionic level infarction (caudate, lentiform nuclei, internal capsule, insula, M1-M3 cortex): 7 -  Supraganglionic infarction (M4-M6 cortex): 3 Total score (0-10 with 10 being normal): 10 These results were communicated to Dr. Otelia Limes at [facility] pmon 9/5/2022by text page via the [facility] messaging system. IMPRESSION: No evidence of acute intracranial abnormality. Paranasal sinus disease, as described. Electronically Signed   By: Jackey Loge D.O.   On: 09/22/2020 18:12   VAS [place] LOWER EXTREMITY VENOUS (DVT)  Result Date: 09/23/2020  Lower Venous DVT Study Patient Name:  SAXON BARICH  Date of Exam:   09/23/2020 Medical Rec #: [id number]      Accession #:    [id number] Date of Birth: 27-Dec-1950      Patient Gender: M Patient Age:   74 years Exam Location:  [facility] Procedure:      VAS [place] LOWER EXTREMITY VENOUS (DVT) Referring Phys: Scheryl Marten Aryah Doering --------------------------------------------------------------------------------  Indications: Stroke.  Comparison Study: No prior studies. Performing Technologist: Jean Rosenthal RDMS, RVT  Examination Guidelines: A complete evaluation includes B-mode imaging, spectral Doppler, color Doppler,  and power Doppler as needed of all accessible portions of each vessel. Bilateral testing is considered an integral part of a complete examination. Limited examinations for reoccurring indications may be performed as noted. The reflux portion of the exam is performed with the patient in reverse Trendelenburg.  +---------+---------------+---------+-----------+----------+--------------+ RIGHT    CompressibilityPhasicitySpontaneityPropertiesThrombus Aging +---------+---------------+---------+-----------+----------+--------------+ CFV      Full           Yes      Yes                                 +---------+---------------+---------+-----------+----------+--------------+ SFJ      Full                                                        +---------+---------------+---------+-----------+----------+--------------+ FV Prox  Full                                                         +---------+---------------+---------+-----------+----------+--------------+ FV Mid   Full                                                        +---------+---------------+---------+-----------+----------+--------------+ FV DistalFull                                                        +---------+---------------+---------+-----------+----------+--------------+ PFV      Full                                                        +---------+---------------+---------+-----------+----------+--------------+ POP      Full           Yes      Yes                                 +---------+---------------+---------+-----------+----------+--------------+ PTV      Full                                                        +---------+---------------+---------+-----------+----------+--------------+ PERO     Full                                                        +---------+---------------+---------+-----------+----------+--------------+  Gastroc  Full                                                        +---------+---------------+---------+-----------+----------+--------------+   +---------+---------------+---------+-----------+----------+--------------+ LEFT     CompressibilityPhasicitySpontaneityPropertiesThrombus Aging +---------+---------------+---------+-----------+----------+--------------+ CFV      Full           Yes      Yes                                 +---------+---------------+---------+-----------+----------+--------------+ SFJ      Full                                                        +---------+---------------+---------+-----------+----------+--------------+ FV Prox  Full                                                        +---------+---------------+---------+-----------+----------+--------------+ FV Mid   Full                                                         +---------+---------------+---------+-----------+----------+--------------+ FV DistalFull                                                        +---------+---------------+---------+-----------+----------+--------------+ PFV      Full                                                        +---------+---------------+---------+-----------+----------+--------------+ POP      Full           Yes      Yes                                 +---------+---------------+---------+-----------+----------+--------------+ PTV      Full                                                        +---------+---------------+---------+-----------+----------+--------------+ PERO     Full                                                        +---------+---------------+---------+-----------+----------+--------------+  Gastroc  Full                                                        +---------+---------------+---------+-----------+----------+--------------+     Summary: RIGHT: - There is no evidence of deep vein thrombosis in the lower extremity.  - No cystic structure found in the popliteal fossa.  LEFT: - There is no evidence of deep vein thrombosis in the lower extremity.  - No cystic structure found in the popliteal fossa.  *See table(s) above for measurements and observations. Electronically signed by Coral Else MD on 09/23/2020 at 7:17:39 PM.    Final    CT ANGIO HEAD NECK W WO CM W PERF (CODE STROKE)  Result Date: 09/22/2020 CLINICAL DATA:  Neuro deficit, acute, stroke suspected. EXAM: CT ANGIOGRAPHY HEAD AND NECK CT PERFUSION BRAIN TECHNIQUE: Multidetector CT imaging of the head and neck was performed using the standard protocol during bolus administration of intravenous contrast. Multiplanar CT image reconstructions and MIPs were obtained to evaluate the vascular anatomy. Carotid stenosis measurements (when applicable) are obtained utilizing NASCET criteria, using the distal internal  carotid diameter as the denominator. Multiphase CT imaging of the brain was performed following IV bolus contrast injection. Subsequent parametric perfusion maps were calculated using RAPID software. CONTRAST:  Administered contrast not known at this time. COMPARISON:  Noncontrast head CT performed earlier today 09/22/2020. Brain MRI 04/19/2020. CT angiogram head/neck 04/19/2020. FINDINGS: CTA NECK FINDINGS Aortic arch: Standard aortic branching. Atherosclerotic plaque within the visualized aortic arch. Streak artifact from a dense left-sided contrast bolus partially obscures the left subclavian artery. Within this limitation, there is no appreciable hemodynamically significant stenosis within the innominate or proximal subclavian arteries. Right carotid system: CCA and ICA patent within the neck without stenosis. Minimal atherosclerotic plaque within the carotid bifurcation. Left carotid system: CCA and ICA patent within the neck without stenosis. Mild soft and calcified plaque within the carotid bifurcation and proximal ICA. Vertebral arteries: Beam hardening artifact from a dense left-sided contrast bolus partially obscures the proximal left vertebral artery. Within this limitation, the vertebral arteries are codominant and patent within the neck without stenosis. Skeleton: Cervical spondylosis. Fusion across the C5-C6 disc space with associated left-sided facet joint ankylosis at this level. No acute bony abnormality or aggressive osseous lesion. Other neck: No neck mass or cervical lymphadenopathy. Upper chest: No consolidation within the imaged lung apices. Review of the MIP images confirms the above findings CTA HEAD FINDINGS Anterior circulation: The intracranial internal carotid arteries are patent. No M2 proximal branch occlusion or high-grade proximal stenosis is identified. The anterior cerebral arteries are patent. No intracranial aneurysm is identified. Posterior circulation: The intracranial vertebral  arteries are patent. The basilar artery is patent. The posterior cerebral arteries are patent. The posterior cerebral arteries are patent. Posterior communicating arteries are hypoplastic or absent bilaterally. Venous sinuses: Within the limitations of contrast timing, no convincing thrombus. Anatomic variants: As described Review of the MIP images confirms the above findings CT Brain Perfusion Findings: The CT perfusion post processing failed. In reviewing the source imaging, this is likely due to patient motion. No large vessel occlusion identified. These results were called by telephone at the time of interpretation on 09/22/2020 at 6:40 pm to provider ERIC [facility] , who verbally acknowledged these results. IMPRESSION: CTA neck: Streak and beam hardening artifact  arising from a dense left-sided contrast bolus partially obscures the proximal left vertebral artery. Within this limitation, the common carotid, internal carotid and vertebral arteries are patent within the neck without stenosis. Mild atherosclerotic plaque within the bilateral carotid systems within the neck, as described. CTA head: No intracranial large vessel occlusion or proximal high-grade arterial stenosis identified. CT perfusion head: Failed CT perfusion, likely due to patient motion. Electronically Signed   By: Jackey Loge D.O.   On: 09/22/2020 18:51     PHYSICAL EXAM  Temp:  [97.9 F (36.6 C)-101 F (38.3 C)] 98.4 F (36.9 C) (09/09 1212) Pulse Rate:  [53-71] 53 (09/09 1212) Resp:  [16-24] 20 (09/09 1212) BP: (121-145)/(64-87) 125/87 (09/09 1212) SpO2:  [90 %-96 %] 96 % (09/09 1212)  General - Well nourished, well developed, in no apparent distress.  Ophthalmologic - fundi not visualized due to noncooperation.  Cardiovascular - Regular rhythm and rate.  Mental Status -  Level of arousal and orientation to time, place, and person were intact. Language including expression, naming, repetition, comprehension was assessed and  found intact. No apraxia Able to do backward spelling of WORLD, serial 7 correct 2/5 Registration 3/3, and delayed recall 3/3 Fund of Knowledge was assessed and was intact.  Cranial Nerves II - XII - II - Visual field intact OU. III, IV, VI - Extraocular movements intact. V - Facial sensation intact bilaterally. VII - Facial movement intact bilaterally. VIII - Hearing & vestibular intact bilaterally. X - Palate elevates symmetrically. XI - Chin turning & shoulder shrug intact bilaterally. XII - Tongue protrusion intact.  Motor Strength - The patient's strength was normal in all extremities and pronator drift was absent.  Bulk was normal and fasciculations were absent.   Motor Tone - Muscle tone was assessed at the neck and appendages and was normal.  Reflexes - The patient's reflexes were symmetrical in all extremities and he had no pathological reflexes.  Sensory - Light touch, temperature/pinprick were assessed and were symmetrical.    Coordination - The patient had normal movements in the hands and feet with no ataxia or dysmetria.  Tremor was absent.  Gait and Station - deferred.   ASSESSMENT/PLAN Mr. Alexander Pierce is a 70 y.o. male with history of HLD, vertigo episode in 04/2020 admitted for aphasia, confusion and fever.  Status post TNK.  TIA vs. Seizure 2/2 hyponatremia vs. Encephalopathy 2/2 fever. Less likely encephalitis CT head negative CTA head neck negative LE venous Doppler negative for DVT MRI with and without contrast neg 2D Echo EF 45 to 50% EEG mild diffuse encephalopathy, no seizure LDL 96 HgbA1c 5.9 SCDs for VTE prophylaxis No antithrombotic prior to admission, now on eliquis 5mg  bid Therapy recommendations: Outpatient PT OT Disposition: Pending  Fever, unclear origin, ? Mild lung infiltrates? T-max 103-> afebrile->100.1->100.8->afebrile Blood culture NGTD Urine culture neg UA WBC 6-10 CXR bibasilar atelectasis or infiltrates COVID PCR  negative Given significant neuro improvement, less likely encephalitis, will hold off LP  Given significant improvement, negative 2D echo, no elective food of endocarditis, will hold off TEE unless blood culture positive On cefepime and vancomycin -> cefepime ->rocephin discussed with Dr. ID and she felt pt fever curve is better, blood culture negative, on Abx, no further work up needed but would like pt stay overnight, if no fever for the next 24h, he can be discharged with close follow up with PCP Dr Elinor Parkinson. If discharge tomorrow, Dr. Jonny Ruiz recommend switch to po cefdinir for 2 more  days (total 7 day course Abx)  Hyponatremia, likely SIADH Na 117->118->122->123->124->125->128->132->129 In 04/2020 Na 134->132 On 3% saline @ 50->30->50->off EEG no seizure Forearm salt 2 g tablet tid Fluid restriction 1200cc  PAF Intermittent A. fib on telemetry-> persistent now Captured on EKG On eliquis 5mg  bid now  Hypertension Stable Long term BP goal normotensive  Hyperlipidemia Home meds: None LDL 96, goal < 70 Now on Lipitor 20 Continue statin at discharge  Other Stroke Risk Factors Advanced age  Other Active Problems Hypokalemia K 3.3 -> 4.5->2.8->3.1 - supplement (Mg 2.3 on 09/24/20)  Hospital day # 4  I discussed with Dr. Elinor Parkinson. I spent  35 minutes in total face-to-face time with the patient, more than 50% of which was spent in counseling and coordination of care, reviewing test results, images and medication, and discussing the diagnosis, treatment plan and potential prognosis. This patient's care requiresreview of multiple databases, neurological assessment, discussion with family, other specialists and medical decision making of high complexity. I had long discussion with wife and pt at bedside, updated pt current condition, treatment plan and potential prognosis, and answered all the questions. They expressed understanding and appreciation.    Marvel Plan, MD  PhD Stroke Neurology 09/26/2020 1:18 PM    To contact Stroke Continuity provider, please refer to [email or web address]. After hours, contact General Neurology

## 2020-09-26 NOTE — Progress Notes (Signed)
Occupational Therapy Treatment and Discharge Patient Details Name: Alexander Pierce MRN: [id number] DOB: December 05, 1950 Today's Date: 09/26/2020    History of present illness Pt is a 69 yo male admitted with fever, confusion, and aphasia.  Pt with EEG showing diffuse encephalopathy.  CT-.  MRI negative. PMH: allergic rhinits, HLD, osteoarthritis   OT comments  Pt has made great progress with all ADL's and IADL's. At this time pt is back to mod I with all ADL's and functional mobility. PT has met all goals and no longer needs OT services. Acute OT will sign off at this time.    Follow Up Recommendations  No OT follow up    Equipment Recommendations  None recommended by OT    Recommendations for Other Services      Precautions / Restrictions Precautions Precautions: None Restrictions Weight Bearing Restrictions: No       Mobility Bed Mobility Overal bed mobility: Modified Independent                  Transfers Overall transfer level: Modified independent Equipment used: None                  Balance Overall balance assessment: Modified Independent                             High Level Balance Comments: stepping over obstacles and around and bending down to pick things up from the floor.           ADL either performed or assessed with clinical judgement   ADL Overall ADL's : Modified independent;At baseline                                       General ADL Comments: Pt able to complete all ADL's with no difficulty, follow multistep commands to complete tasks, and ambulate with no DME safely.     Vision       Perception     Praxis      Cognition Arousal/Alertness: Awake/alert Behavior During Therapy: WFL for tasks assessed/performed Overall Cognitive Status: Within Functional Limits for tasks assessed                                          Exercises     Shoulder Instructions       General  Comments VSS on RA, cognition WFL with executive functioning testing, as pt follows multistep commands.    Pertinent Vitals/ Pain       Pain Assessment: No/denies pain  Home Living                                          Prior Functioning/Environment              Frequency           Progress Toward Goals  OT Goals(current goals can now be found in the care plan section)  Progress towards OT goals: Goals met/education completed, patient discharged from OT  Acute Rehab OT Goals Patient Stated Goal: to go home OT Goal Formulation: With patient Time For Goal Achievement: 09/26/20 Potential to Achieve Goals: Good ADL Goals  Pt Will Perform Eating: Independently;sitting Pt Will Perform Grooming: with supervision;standing Pt Will Perform Tub/Shower Transfer: Shower transfer;with supervision;ambulating Additional ADL Goal #1: Pt will walk to bathroom and complete all toileting with mod I. Additional ADL Goal #2: Pt will follow 3 step commands during adls with 837% accuracy.  Plan All goals met and education completed, patient discharged from OT services    Co-evaluation                 AM-PAC OT "6 Clicks" Daily Activity     Outcome Measure   Help from another person eating meals?: None Help from another person taking care of personal grooming?: None Help from another person toileting, which includes using toliet, bedpan, or urinal?: None Help from another person bathing (including washing, rinsing, drying)?: None Help from another person to put on and taking off regular upper body clothing?: None Help from another person to put on and taking off regular lower body clothing?: None 6 Click Score: 24    End of Session    OT Visit Diagnosis: Unsteadiness on feet (R26.81);Other symptoms and signs involving the nervous system (R29.898);Other symptoms and signs involving cognitive function   Activity Tolerance Patient tolerated treatment well    Patient Left in bed;with call bell/phone within reach   Nurse Communication Mobility status        Time: 2902-1115 OT Time Calculation (min): 15 min  Charges: OT General Charges $OT Visit: 1 Visit OT Treatments $Self Care/Home Management : 8-22 mins  Alexander Williamsen H., OTR/L [place] 09/26/2020, 4:17 PM

## 2020-09-26 NOTE — TOC Benefit Eligibility Note (Signed)
Patient Product/process development scientist completed.    The patient is currently admitted and upon discharge could be taking Eliquis 5 mg.  The current 30 day co-pay is, $47.00.   The patient is insured through [facility] [employer] [facility] Commercial Insurance     Roland Earl, CPhT Pharmacy Patient Advocate Specialist [facility] Antimicrobial Stewardship Team Direct Number: [phone]  Fax: [phone]

## 2020-09-26 NOTE — Plan of Care (Signed)
  Problem: Safety: Goal: Non-violent Restraint(s) Outcome: Not Applicable   

## 2020-09-27 LAB — CBC
HCT: 35.9 % — ABNORMAL LOW (ref 39.0–52.0)
Hemoglobin: 12.5 g/dL — ABNORMAL LOW (ref 13.0–17.0)
MCH: 30 pg (ref 26.0–34.0)
MCHC: 34.8 g/dL (ref 30.0–36.0)
MCV: 86.1 fL (ref 80.0–100.0)
Platelets: 293 10*3/uL (ref 150–400)
RBC: 4.17 MIL/uL — ABNORMAL LOW (ref 4.22–5.81)
RDW: 13.2 % (ref 11.5–15.5)
WBC: 7.9 10*3/uL (ref 4.0–10.5)
nRBC: 0 % (ref 0.0–0.2)

## 2020-09-27 LAB — BASIC METABOLIC PANEL
Anion gap: 8 (ref 5–15)
BUN: 7 mg/dL — ABNORMAL LOW (ref 8–23)
CO2: 26 mmol/L (ref 22–32)
Calcium: 8.3 mg/dL — ABNORMAL LOW (ref 8.9–10.3)
Chloride: 99 mmol/L (ref 98–111)
Creatinine, Ser: 0.68 mg/dL (ref 0.61–1.24)
GFR, Estimated: >60 mL/min (ref >60)
Glucose, Bld: 103 mg/dL — ABNORMAL HIGH (ref 70–99)
Potassium: 3.8 mmol/L (ref 3.5–5.1)
Sodium: 133 mmol/L — ABNORMAL LOW (ref 135–145)

## 2020-09-27 LAB — CULTURE, BLOOD (SINGLE): Culture: NO GROWTH

## 2020-09-27 LAB — GLUCOSE, CAPILLARY
Glucose-Capillary: 104 mg/dL — ABNORMAL HIGH (ref 70–99)
Glucose-Capillary: 110 mg/dL — ABNORMAL HIGH (ref 70–99)
Glucose-Capillary: 112 mg/dL — ABNORMAL HIGH (ref 70–99)
Glucose-Capillary: 115 mg/dL — ABNORMAL HIGH (ref 70–99)

## 2020-09-27 MED ORDER — CEFDINIR 300 MG PO CAPS: 300.0000 mg | ORAL_CAPSULE | Freq: Two times a day (BID) | ORAL | Status: DC

## 2020-09-27 MED ORDER — ACETAMINOPHEN 500 MG PO TABS
500.0000 mg | ORAL_TABLET | Freq: Four times a day (QID) | ORAL | 0 refills | Status: DC | PRN
  Filled 2020-09-27: qty 30, 8d supply, fill #0

## 2020-09-27 MED ORDER — APIXABAN 5 MG PO TABS
5.0000 mg | ORAL_TABLET | Freq: Two times a day (BID) | ORAL | 2 refills | Status: DC
  Filled 2020-09-27: qty 60, 30d supply, fill #0

## 2020-09-27 MED ORDER — CEFDINIR 300 MG PO CAPS
300.0000 mg | ORAL_CAPSULE | Freq: Two times a day (BID) | ORAL | 0 refills | Status: AC
  Filled 2020-09-27: qty 4, 2d supply, fill #0

## 2020-09-27 MED ORDER — ATORVASTATIN CALCIUM 20 MG PO TABS
20.0000 mg | ORAL_TABLET | Freq: Every day | ORAL | 1 refills | Status: DC
  Filled 2020-09-27: qty 30, 30d supply, fill #0

## 2020-09-27 NOTE — Plan of Care (Signed)
  Problem: Education: Goal: Knowledge of General Education information will improve Description: Including pain rating scale, medication(s)/side effects and non-pharmacologic comfort measures Outcome: Progressing   Problem: Clinical Measurements: Goal: Ability to maintain clinical measurements within normal limits will improve Outcome: Progressing Goal: Will remain free from infection Outcome: Progressing Goal: Diagnostic test results will improve Outcome: Progressing Goal: Respiratory complications will improve Outcome: Progressing Goal: Cardiovascular complication will be avoided Outcome: Progressing   Problem: Health Behavior/Discharge Planning: Goal: Ability to manage health-related needs will improve Outcome: Progressing   Problem: Skin Integrity: Goal: Risk for impaired skin integrity will decrease Outcome: Progressing

## 2020-09-27 NOTE — Discharge Summary (Addendum)
Stroke Discharge Summary  Patient ID: Alexander Pierce   MRN: [id number]      DOB: 1950-07-01  Date of Admission: 09/22/2020 Date of Discharge: 09/27/2020  Attending Physician:  Stroke, Md, MD, Stroke MD Consultant(s):    pulmonary/intensive care Dr. Marcelle Smiling Patient's PCP:  [name], MD  DISCHARGE DIAGNOSIS: TIA-vs-Seizure secondary to hyponatremia vs- encephalopathy d/t fever.  Principal Problem:   Hyponatremia Active Problems:   Encounter for monitoring thrombolytic therapy   Fever   Blood pressure elevated without history of HTN   Facial droop due to acute stroke (HCC)   Altered mental status   Dehydration   Allergies as of 09/27/2020       Reactions   Acetaminophen    REACTION: eyes swell Per family, patient takes Goody's powders   Aspirin    REACTION: eyes swell   Ibuprofen    REACTION: eyes swell   Penicillins    REACTION: pt cant remember        Medication List     STOP taking these medications    Cetirizine HCl 10 MG Caps       TAKE these medications    acetaminophen 500 MG tablet Commonly known as: TYLENOL Take 1 tablet (500 mg total) by mouth every 6 (six) hours as needed for fever.   apixaban 5 MG Tabs tablet Commonly known as: ELIQUIS Take 1 tablet (5 mg total) by mouth 2 (two) times daily.   atorvastatin 20 MG tablet Commonly known as: LIPITOR Take 1 tablet (20 mg total) by mouth daily. Start taking on: September 28, 2020   cefdinir 300 MG capsule Commonly known as: OMNICEF Take 1 capsule (300 mg total) by mouth 2 (two) times daily for 2 days.   ESTER C PO Take 1 tablet by mouth daily.   QC Vitamin D3 50 MCG (2000 UT) Tabs Generic drug: Cholecalciferol Take 2,000 Units by mouth daily.   VITAMIN E PO Take by mouth.        LABORATORY STUDIES CBC    Component Value Date/Time   WBC 7.9 09/27/2020 0309   RBC 4.17 (L) 09/27/2020 0309   HGB 12.5 (L) 09/27/2020 0309   HCT 35.9 (L) 09/27/2020 0309   PLT 293  09/27/2020 0309   MCV 86.1 09/27/2020 0309   MCH 30.0 09/27/2020 0309   MCHC 34.8 09/27/2020 0309   RDW 13.2 09/27/2020 0309   LYMPHSABS 0.5 (L) 09/22/2020 1800   MONOABS 0.7 09/22/2020 1800   EOSABS 0.0 09/22/2020 1800   BASOSABS 0.0 09/22/2020 1800   CMP    Component Value Date/Time   NA 133 (L) 09/27/2020 0309   K 3.8 09/27/2020 0309   CL 99 09/27/2020 0309   CO2 26 09/27/2020 0309   GLUCOSE 103 (H) 09/27/2020 0309   BUN 7 (L) 09/27/2020 0309   CREATININE 0.68 09/27/2020 0309   CALCIUM 8.3 (L) 09/27/2020 0309   PROT 7.2 09/22/2020 1800   ALBUMIN 3.2 (L) 09/22/2020 1800   AST 34 09/22/2020 1800   ALT 24 09/22/2020 1800   ALKPHOS 64 09/22/2020 1800   BILITOT 1.0 09/22/2020 1800   GFRNONAA >60 09/27/2020 0309   GFRAA 128 06/29/2007 0811   COAGS Lab Results  Component Value Date   INR 1.3 (H) 09/22/2020   Lipid Panel    Component Value Date/Time   CHOL 149 09/23/2020 0143   TRIG 53 09/23/2020 0143   HDL 42 09/23/2020 0143   CHOLHDL 3.5 09/23/2020 0143  VLDL 11 09/23/2020 0143   LDLCALC 96 09/23/2020 0143   HgbA1C  Lab Results  Component Value Date   HGBA1C 5.9 (H) 09/23/2020   Urinalysis    Component Value Date/Time   COLORURINE YELLOW 09/22/2020 2146   APPEARANCEUR CLEAR 09/22/2020 2146   LABSPEC >1.046 (H) 09/22/2020 2146   PHURINE 5.0 09/22/2020 2146   GLUCOSEU NEGATIVE 09/22/2020 2146   GLUCOSEU NEGATIVE 04/24/2020 0912   HGBUR MODERATE (A) 09/22/2020 2146   BILIRUBINUR NEGATIVE 09/22/2020 2146   KETONESUR 20 (A) 09/22/2020 2146   PROTEINUR 100 (A) 09/22/2020 2146   UROBILINOGEN 0.2 04/24/2020 0912   NITRITE NEGATIVE 09/22/2020 2146   LEUKOCYTESUR NEGATIVE 09/22/2020 2146   Urine Drug Screen No results found for: LABOPIA, COCAINSCRNUR, LABBENZ, AMPHETMU, THCU, LABBARB  Alcohol Level    Component Value Date/Time   [facility] <10 09/22/2020 2147     SIGNIFICANT DIAGNOSTIC STUDIES CT HEAD WO CONTRAST ( )  Result Date: 09/22/2020 CLINICAL DATA:   Follow-up examination for acute stroke, waxing and waning dysarthria, right-sided weakness. EXAM: CT HEAD WITHOUT CONTRAST TECHNIQUE: Contiguous axial images were obtained from the base of the skull through the vertex without intravenous contrast. COMPARISON:  CT from earlier the same day. FINDINGS: Brain: Cerebral volume within normal limits. No acute intracranial hemorrhage. No visible acute large vessel territory infarct. No mass lesion, midline shift or mass effect. No hydrocephalus or extra-axial fluid collection. Vascular: No asymmetric hyperdense vessel. Skull: Scalp soft tissues and calvarium within normal limits. Sinuses/Orbits: Globes and orbital soft tissues demonstrate no acute finding. Mild scattered mucosal thickening noted throughout the paranasal sinuses. Mastoid air cells remain clear. Other: None. IMPRESSION: Stable head CT.  No acute intracranial abnormality identified. Electronically Signed   By: Rise Mu M.D.   On: 09/22/2020 22:18   MR BRAIN W WO CONTRAST  Result Date: 09/23/2020 CLINICAL DATA:  Stroke follow-up.  Aphasia. EXAM: MRI HEAD WITHOUT AND WITH CONTRAST TECHNIQUE: Multiplanar, multiecho pulse sequences of the brain and surrounding structures were obtained without and with intravenous contrast. CONTRAST:  9mL GADAVIST GADOBUTROL 1 MMOL/ML IV SOLN COMPARISON:  Head CT 09/22/2020 and MRI 04/19/2020 FINDINGS: The study is mildly motion degraded. Brain: There is no evidence of an acute infarct, intracranial hemorrhage, mass, midline shift, or extra-axial fluid collection. Small T2 hyperintensities in the cerebral white matter and pons are more conspicuous than on the prior MRI and are nonspecific but compatible with mild chronic small vessel ischemic disease. Mild cerebral atrophy is within normal limits for age. No abnormal enhancement is identified. Vascular: Major intracranial vascular flow voids are preserved. Skull and upper cervical spine: Unremarkable bone marrow  signal. Sinuses/Orbits: Unremarkable orbits. Small mucous retention cyst in the right maxillary sinus. Clear mastoid air cells. Other: None. IMPRESSION: 1. No acute intracranial abnormality. 2. Mild chronic small vessel ischemic disease. Electronically Signed   By: Sebastian Ache M.D.   On: 09/23/2020 19:10   DG Chest Port 1 View  Result Date: 09/22/2020 CLINICAL DATA:  Questionable sepsis EXAM: PORTABLE CHEST 1 VIEW COMPARISON:  06/03/2020 FINDINGS: Cardiomegaly. Mild vascular congestion. Bibasilar airspace opacities, bibasilar airspace opacities. No effusions or overt edema. No acute bony abnormality. IMPRESSION: Cardiomegaly, vascular congestion. Bibasilar atelectasis or infiltrates. Electronically Signed   By: Charlett Nose M.D.   On: 09/22/2020 19:19   EEG adult  Result Date: 09/23/2020 Charlsie Quest, MD     09/23/2020 10:27 AM Patient Name: Alexander Pierce MRN: [id number] Epilepsy Attending: Charlsie Quest Referring Physician/Provider: Dr Marvel Plan Date:  09/23/2020 Duration: 22.21 mins Patient history: 70 y.o. male presenting with acute onset of expressive and receptive aphasia. EEG to evaluate for seizures. Level of alertness: Awake, drowsy AEDs during EEG study: None Technical aspects: This EEG study was done with scalp electrodes positioned according to the 10-20 International system of electrode placement. Electrical activity was acquired at a sampling rate of  and reviewed with a high frequency filter of  and a low frequency filter of . EEG data were recorded continuously and digitally stored. Description: The posterior dominant rhythm consists of 7.5 Hz activity of moderate voltage (25-35 uV) seen predominantly in posterior head regions, symmetric and reactive to eye opening and eye closing. Drowsiness was characterized by attenuation of the posterior background rhythm. EEG showed intermittent generalized 3 to 6 Hz theta-delta slowing. Hyperventilation and photic stimulation were not  performed.   ABNORMALITY - Intermittent slow, generalized IMPRESSION: This study is suggestive of mild diffuse encephalopathy, nonspecific etiology. No seizures or epileptiform discharges were seen throughout the recording. Charlsie Quest   ECHOCARDIOGRAM COMPLETE  Result Date: 09/23/2020    ECHOCARDIOGRAM REPORT   Patient Name:   Alexander Pierce Date of Exam: 09/23/2020 Medical Rec #:  [id number]     Height:       74.0 in Accession #:    [id number]    Weight:       212.1 lb Date of Birth:  Jan 20, 1950     BSA:          2.228 m Patient Age:    69 years      BP:           138/83 mmHg Patient Gender: M             HR:           100 bpm. Exam Location:  Inpatient Procedure: 2D Echo, Cardiac Doppler and Color Doppler REPORT CONTAINS CRITICAL RESULT  Reported to: Dr Delane Ginger. Indications:    Fever  History:        Patient has no prior history of Echocardiogram examinations.                 Risk Factors:Dyslipidemia.  Sonographer:    Neomia Dear RDCS Referring Phys: [id number] LAURA R GLEASON IMPRESSIONS  1. Left ventricular ejection fraction, by estimation, is 45 to 50%. The left ventricle has mildly decreased function. The left ventricle has no regional wall motion abnormalities. Left ventricular diastolic function could not be evaluated.  2. Right ventricular systolic function is normal. The right ventricular size is normal. There is normal pulmonary artery systolic pressure.  3. The mitral valve is grossly normal. Trivial mitral valve regurgitation.  4. 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 The aortic valve is calcified. Aortic valve regurgitation is trivial. Mild aortic valve stenosis.  5. Aortic dilatation noted. 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 FINDINGS  Left Ventricle: Left ventricular ejection fraction, by estimation, is 45 to 50%. The left ventricle has mildly decreased function. The left ventricle has no regional wall motion abnormalities. The left ventricular internal cavity  size was normal in size. There is no left ventricular hypertrophy. Left ventricular diastolic function could not be evaluated due to atrial fibrillation. Left ventricular diastolic function could not be evaluated. Right Ventricle: The right ventricular size is normal. Right vetricular wall thickness was not well visualized. Right ventricular systolic function is normal. There is normal pulmonary artery systolic pressure. The tricuspid regurgitant velocity is  2.55 m/s, and with an assumed right atrial pressure of 3 mmHg, the estimated right ventricular systolic pressure is 29.0 mmHg. Left Atrium: Left atrial size was normal in size. Right Atrium: Right atrial size was normal in size. Pericardium: There is no evidence of pericardial effusion. Mitral Valve: The mitral valve is grossly normal. Trivial mitral valve regurgitation. Tricuspid Valve: The tricuspid valve is grossly normal. Tricuspid valve regurgitation is trivial. 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 The aortic valve is calcified. Aortic valve regurgitation is trivial. Aortic regurgitation PHT measures 345 msec. Mild aortic stenosis is present. Aortic valve mean gradient measures 13.7 mmHg. Aortic valve peak gradient measures 23.7 mmHg. Aortic valve area, by VTI measures 2.24 cm. Pulmonic Valve: The pulmonic valve was normal in structure. Pulmonic valve regurgitation is not visualized. Aorta: Aortic dilatation noted. 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 IAS/Shunts: The atrial septum is grossly normal.  LEFT VENTRICLE PLAX 2D LVIDd:         5.10 cm LVIDs:         3.80 cm LV PW:         1.10 cm LV IVS:        1.30 cm LVOT diam:     2.60 cm LV SV:         75 LV SV Index:   34 LVOT Area:     5.31 cm  LV Volumes (MOD) LV vol d, MOD A2C: 68.0 ml LV vol d, MOD A4C: 108.0 ml LV vol s, MOD A2C: 49.1 ml LV vol s, MOD A4C: 55.2 ml LV SV MOD A2C:     18.9 ml LV SV MOD A4C:     108.0 ml LV SV MOD BP:      32.7 ml RIGHT  VENTRICLE RV Basal diam:  2.10 cm RV Mid diam:    1.20 cm RV S prime:     12.90 cm/s TAPSE (M-mode): 2.2 cm LEFT ATRIUM             Index       RIGHT ATRIUM           Index LA Vol (A2C):   53.3 ml 23.92 ml/m RA Area:     15.40 cm LA Vol (A4C):   51.7 ml 23.20 ml/m RA Volume:   33.30 ml  14.94 ml/m LA Biplane Vol: 52.6 ml 23.61 ml/m  AORTIC VALVE                    PULMONIC VALVE AV Area (Vmax):    1.98 cm     PV Vmax:       1.05 m/s AV Area (Vmean):   1.91 cm     PV Vmean:      67.300 cm/s AV Area (VTI):     2.24 cm     PV VTI:        0.142 m AV Vmax:           243.33 cm/s  PV Peak grad:  4.4 mmHg AV Vmean:          170.000 cm/s PV Mean grad:  2.0 mmHg AV VTI:            0.334 m AV Peak Grad:      23.7 mmHg AV Mean Grad:      13.7 mmHg LVOT Vmax:         90.70 cm/s LVOT Vmean:  61.100 cm/s LVOT VTI:          0.141 m LVOT/AV VTI ratio: 0.42 AI PHT:            345 msec  AORTA Ao Root diam: 3.70 cm Ao Asc diam:  5.40 cm MR Peak grad: 109.4 mmHg  TRICUSPID VALVE MR Mean grad: 76.0 mmHg   TR Peak grad:   26.0 mmHg MR Vmax:      523.00 cm/s TR Vmax:        255.00 cm/s MR Vmean:     408.0 cm/s                           SHUNTS                           Systemic VTI:  0.14 m                           Systemic Diam: 2.60 cm Kristeen Miss MD Electronically signed by Kristeen Miss MD Signature Date/Time: 09/23/2020/1:03:36 PM    Final    CT HEAD CODE STROKE WO CONTRAST  Result Date: 09/22/2020 CLINICAL DATA:  Code stroke. Neuro deficit, acute, stroke suspected. Additional history provided: Last known well 1430, aphasia, LVO score 4. EXAM: CT HEAD WITHOUT CONTRAST TECHNIQUE: Contiguous axial images were obtained from the base of the skull through the vertex without intravenous contrast. COMPARISON:  Brain MRI 04/19/2020. Noncontrast head CT 04/19/2020. CT angiogram head/neck 04/19/2020. FINDINGS: Brain: Cerebral volume is normal. There is no acute intracranial hemorrhage. No demarcated cortical infarct. No  extra-axial fluid collection. No evidence of an intracranial mass. No midline shift. Vascular: No hyperdense vessel. Skull: Normal. Negative for fracture or focal lesion. Sinuses/Orbits: Visualized orbits show no acute finding. Postsurgical appearance of the paranasal sinuses. Mild scattered paranasal sinus mucosal thickening at the imaged levels. Small bilateral maxillary sinus mucous retention cysts. ASPECTS ([place] Stroke Program Early CT Score) - Ganglionic level infarction (caudate, lentiform nuclei, internal capsule, insula, M1-M3 cortex): 7 - Supraganglionic infarction (M4-M6 cortex): 3 Total score (0-10 with 10 being normal): 10 These results were communicated to Dr. Otelia Limes at [facility] pmon 9/5/2022by text page via the [facility] messaging system. IMPRESSION: No evidence of acute intracranial abnormality. Paranasal sinus disease, as described. Electronically Signed   By: Jackey Loge D.O.   On: 09/22/2020 18:12   VAS [place] LOWER EXTREMITY VENOUS (DVT)  Result Date: 09/23/2020  Lower Venous DVT Study Patient Name:  Alexander Pierce  Date of Exam:   09/23/2020 Medical Rec #: [id number]      Accession #:    [id number] Date of Birth: July 06, 1950      Patient Gender: M Patient Age:   65 years Exam Location:  [facility] Procedure:      VAS [place] LOWER EXTREMITY VENOUS (DVT) Referring Phys: Scheryl Marten XU --------------------------------------------------------------------------------  Indications: Stroke.  Comparison Study: No prior studies. Performing Technologist: Jean Rosenthal RDMS, RVT  Examination Guidelines: A complete evaluation includes B-mode imaging, spectral Doppler, color Doppler, and power Doppler as needed of all accessible portions of each vessel. Bilateral testing is considered an integral part of a complete examination. Limited examinations for reoccurring indications may be performed as noted. The reflux portion of the exam is performed with the patient in reverse Trendelenburg.   +---------+---------------+---------+-----------+----------+--------------+ RIGHT    CompressibilityPhasicitySpontaneityPropertiesThrombus Aging +---------+---------------+---------+-----------+----------+--------------+ CFV      Full  Yes      Yes                                 +---------+---------------+---------+-----------+----------+--------------+ SFJ      Full                                                        +---------+---------------+---------+-----------+----------+--------------+ FV Prox  Full                                                        +---------+---------------+---------+-----------+----------+--------------+ FV Mid   Full                                                        +---------+---------------+---------+-----------+----------+--------------+ FV DistalFull                                                        +---------+---------------+---------+-----------+----------+--------------+ PFV      Full                                                        +---------+---------------+---------+-----------+----------+--------------+ POP      Full           Yes      Yes                                 +---------+---------------+---------+-----------+----------+--------------+ PTV      Full                                                        +---------+---------------+---------+-----------+----------+--------------+ PERO     Full                                                        +---------+---------------+---------+-----------+----------+--------------+ Gastroc  Full                                                        +---------+---------------+---------+-----------+----------+--------------+   +---------+---------------+---------+-----------+----------+--------------+ LEFT     CompressibilityPhasicitySpontaneityPropertiesThrombus Aging  +---------+---------------+---------+-----------+----------+--------------+ CFV  Full           Yes      Yes                                 +---------+---------------+---------+-----------+----------+--------------+ SFJ      Full                                                        +---------+---------------+---------+-----------+----------+--------------+ FV Prox  Full                                                        +---------+---------------+---------+-----------+----------+--------------+ FV Mid   Full                                                        +---------+---------------+---------+-----------+----------+--------------+ FV DistalFull                                                        +---------+---------------+---------+-----------+----------+--------------+ PFV      Full                                                        +---------+---------------+---------+-----------+----------+--------------+ POP      Full           Yes      Yes                                 +---------+---------------+---------+-----------+----------+--------------+ PTV      Full                                                        +---------+---------------+---------+-----------+----------+--------------+ PERO     Full                                                        +---------+---------------+---------+-----------+----------+--------------+ Gastroc  Full                                                        +---------+---------------+---------+-----------+----------+--------------+  Summary: RIGHT: - There is no evidence of deep vein thrombosis in the lower extremity.  - No cystic structure found in the popliteal fossa.  LEFT: - There is no evidence of deep vein thrombosis in the lower extremity.  - No cystic structure found in the popliteal fossa.  *See table(s) above for measurements and observations. Electronically signed  by Coral Else MD on 09/23/2020 at 7:17:39 PM.    Final    CT ANGIO HEAD NECK W WO CM W PERF (CODE STROKE)  Result Date: 09/22/2020 CLINICAL DATA:  Neuro deficit, acute, stroke suspected. EXAM: CT ANGIOGRAPHY HEAD AND NECK CT PERFUSION BRAIN TECHNIQUE: Multidetector CT imaging of the head and neck was performed using the standard protocol during bolus administration of intravenous contrast. Multiplanar CT image reconstructions and MIPs were obtained to evaluate the vascular anatomy. Carotid stenosis measurements (when applicable) are obtained utilizing NASCET criteria, using the distal internal carotid diameter as the denominator. Multiphase CT imaging of the brain was performed following IV bolus contrast injection. Subsequent parametric perfusion maps were calculated using RAPID software. CONTRAST:  Administered contrast not known at this time. COMPARISON:  Noncontrast head CT performed earlier today 09/22/2020. Brain MRI 04/19/2020. CT angiogram head/neck 04/19/2020. FINDINGS: CTA NECK FINDINGS Aortic arch: Standard aortic branching. Atherosclerotic plaque within the visualized aortic arch. Streak artifact from a dense left-sided contrast bolus partially obscures the left subclavian artery. Within this limitation, there is no appreciable hemodynamically significant stenosis within the innominate or proximal subclavian arteries. Right carotid system: CCA and ICA patent within the neck without stenosis. Minimal atherosclerotic plaque within the carotid bifurcation. Left carotid system: CCA and ICA patent within the neck without stenosis. Mild soft and calcified plaque within the carotid bifurcation and proximal ICA. Vertebral arteries: Beam hardening artifact from a dense left-sided contrast bolus partially obscures the proximal left vertebral artery. Within this limitation, the vertebral arteries are codominant and patent within the neck without stenosis. Skeleton: Cervical spondylosis. Fusion across the C5-C6  disc space with associated left-sided facet joint ankylosis at this level. No acute bony abnormality or aggressive osseous lesion. Other neck: No neck mass or cervical lymphadenopathy. Upper chest: No consolidation within the imaged lung apices. Review of the MIP images confirms the above findings CTA HEAD FINDINGS Anterior circulation: The intracranial internal carotid arteries are patent. No M2 proximal branch occlusion or high-grade proximal stenosis is identified. The anterior cerebral arteries are patent. No intracranial aneurysm is identified. Posterior circulation: The intracranial vertebral arteries are patent. The basilar artery is patent. The posterior cerebral arteries are patent. The posterior cerebral arteries are patent. Posterior communicating arteries are hypoplastic or absent bilaterally. Venous sinuses: Within the limitations of contrast timing, no convincing thrombus. Anatomic variants: As described Review of the MIP images confirms the above findings CT Brain Perfusion Findings: The CT perfusion post processing failed. In reviewing the source imaging, this is likely due to patient motion. No large vessel occlusion identified. These results were called by telephone at the time of interpretation on 09/22/2020 at 6:40 pm to provider ERIC [facility] , who verbally acknowledged these results. IMPRESSION: CTA neck: Streak and beam hardening artifact arising from a dense left-sided contrast bolus partially obscures the proximal left vertebral artery. Within this limitation, the common carotid, internal carotid and vertebral arteries are patent within the neck without stenosis. Mild atherosclerotic plaque within the bilateral carotid systems within the neck, as described. CTA head: No intracranial large vessel occlusion or proximal high-grade arterial stenosis identified. CT perfusion head: Failed CT  perfusion, likely due to patient motion. Electronically Signed   By: Jackey LogeKyle  Golden D.O.   On: 09/22/2020 18:51       HISTORY OF PRESENT ILLNESS   Alexander Pierce is an 70 y.o. male with a PMHx of HLD who presents acutely to the ED as a Code Stroke for expressive and receptive dysphasia. EMS was called to his home by family after the sudden onset of what was initially thought to be confusion. On EMS assessment, he had difficulty understanding > 1 step commands and had significant word finding difficulty. Code Stroke was called in the field. Wife had stated to EMS that the patient had been fatigued and feverish recently. His sudden neurological change was noted at 2:30 PM (witnessed time of symptom onset).    HOSPITAL COURSE Work-up in the ED was significant for Na of 117, no leukocytosis or lactic acidosis, CXR with atelectasis and possible basal infiltrates, normal renal function, UA with ketones and high specific gravity.   Wife denies any ETOH use, no diuretics at home.  Prior labs show mild hyponatremia 130-132.  Patient developed acute hypoosmolar hyponatremia with very concentrated urine, which resolved during hopspitalization. He also developed a fever with unclear source, blood and urine cultures were negative, patient was COVID negative. He was on cefepime, ceftriaxone. He developed acute respiratory failure, with chest x ray revealing atelectasis vs infiltrate at right lower lobe. He required oxygen for short time, but was weaned off to room air.    Neurologically he appears intact at time of discharge. He is oriented fully. Moves all extremities and follows all commands. His cranial nerve exam is unremarkable. Brain MRI was unremarkable, and he is cleared for discharge.     DISCHARGE EXAM Blood pressure (!) 152/94, pulse (!) 59, temperature 98.7 F (37.1 C), temperature source Oral, resp. rate 20, height 6\' 2"  (1.88 m), weight 96.2 kg, SpO2 97 %.   NEURO EXAM Mental Status -  Level of arousal and orientation to time, place, and person were intact. Language including expression, naming, repetition,  comprehension was assessed and found intact. No apraxia Able to do backward spelling of WORLD, serial 7 correct 2/5 Registration 3/3, and delayed recall 3/3 Fund of Knowledge was assessed and was intact.   Cranial Nerves II - XII - II - Visual field intact OU. III, IV, VI - Extraocular movements intact. V - Facial sensation intact bilaterally. VII - Facial movement intact bilaterally. VIII - Hearing & vestibular intact bilaterally. X - Palate elevates symmetrically. XI - Chin turning & shoulder shrug intact bilaterally. XII - Tongue protrusion intact.   Motor Strength - The patient's strength was normal in all extremities and pronator drift was absent.  Bulk was normal and fasciculations were absent.   Motor Tone - Muscle tone was assessed at the neck and appendages and was normal.   Reflexes - The patient's reflexes were symmetrical in all extremities and he had no pathological reflexes.   Sensory - Light touch, temperature/pinprick were assessed and were symmetrical.     Coordination - The patient had normal movements in the hands and feet with no ataxia or dysmetria.  Tremor was absent.   Gait and Station   Discharge Diet       Diet   Diet Heart Room service appropriate? Yes; Fluid consistency: Thin; Fluid restriction: 1200 mL Fluid   liquids  DISCHARGE PLAN Disposition:  home Eliquis (apixaban) daily for secondary stroke prevention Ongoing stroke risk factor control by Primary Care Physician  at time of discharge Follow-up PCP [name], MD in 2 weeks. Follow-up in [facility] in 4 weeks, office to schedule an appointment.     35 minutes were spent preparing discharge.   Seen - exam back to baseline; Alert and lucid and language normal; VFF; motor normal.

## 2020-09-27 NOTE — Plan of Care (Signed)
  Problem: Education: Goal: Knowledge of General Education information will improve Description: Including pain rating scale, medication(s)/side effects and non-pharmacologic comfort measures 09/27/2020 1357 by Coralie Common, RN Outcome: Adequate for Discharge 09/27/2020 1024 by Coralie Common, RN Outcome: Progressing   Problem: Health Behavior/Discharge Planning: Goal: Ability to manage health-related needs will improve 09/27/2020 1357 by Coralie Common, RN Outcome: Adequate for Discharge 09/27/2020 1024 by Coralie Common, RN Outcome: Progressing   Problem: Clinical Measurements: Goal: Ability to maintain clinical measurements within normal limits will improve 09/27/2020 1357 by Coralie Common, RN Outcome: Adequate for Discharge 09/27/2020 1024 by Coralie Common, RN Outcome: Progressing Goal: Will remain free from infection 09/27/2020 1357 by Coralie Common, RN Outcome: Adequate for Discharge 09/27/2020 1024 by Coralie Common, RN Outcome: Progressing Goal: Diagnostic test results will improve 09/27/2020 1357 by Coralie Common, RN Outcome: Adequate for Discharge 09/27/2020 1024 by Coralie Common, RN Outcome: Progressing Goal: Respiratory complications will improve 09/27/2020 1357 by Coralie Common, RN Outcome: Adequate for Discharge 09/27/2020 1024 by Coralie Common, RN Outcome: Progressing Goal: Cardiovascular complication will be avoided 09/27/2020 1357 by Coralie Common, RN Outcome: Adequate for Discharge 09/27/2020 1024 by Coralie Common, RN Outcome: Progressing   Problem: Nutrition: Goal: Adequate nutrition will be maintained 09/27/2020 1357 by Coralie Common, RN Outcome: Adequate for Discharge 09/27/2020 1024 by Coralie Common, RN Outcome: Progressing   Problem: Activity: Goal: Risk for activity intolerance will decrease 09/27/2020 1357 by Coralie Common, RN Outcome: Adequate for Discharge 09/27/2020 1024 by Coralie Common, RN Outcome:  Progressing   Problem: Education: Goal: Knowledge of disease or condition will improve 09/27/2020 1357 by Coralie Common, RN Outcome: Adequate for Discharge 09/27/2020 1025 by Coralie Common, RN Outcome: Progressing 09/27/2020 1025 by Coralie Common, RN Outcome: Progressing Goal: Knowledge of secondary prevention will improve 09/27/2020 1357 by Coralie Common, RN Outcome: Adequate for Discharge 09/27/2020 1025 by Coralie Common, RN Outcome: Progressing Goal: Knowledge of patient specific risk factors addressed and post discharge goals established will improve Outcome: Adequate for Discharge   Problem: Skin Integrity: Goal: Risk for impaired skin integrity will decrease 09/27/2020 1357 by Coralie Common, RN Outcome: Adequate for Discharge 09/27/2020 1024 by Coralie Common, RN Outcome: Progressing   Problem: Safety: Goal: Ability to remain free from injury will improve 09/27/2020 1357 by Coralie Common, RN Outcome: Adequate for Discharge 09/27/2020 1024 by Coralie Common, RN Outcome: Progressing

## 2020-09-28 LAB — CULTURE, BLOOD (SINGLE): Culture: NO GROWTH

## 2020-09-29 ENCOUNTER — Other Ambulatory Visit (HOSPITAL_COMMUNITY): Payer: Self-pay

## 2020-10-01 ENCOUNTER — Telehealth: Payer: Self-pay

## 2020-10-01 NOTE — Telephone Encounter (Signed)
Transition Care Management Unsuccessful Follow-up Telephone Call  Date of discharge and from where:  09/27/20 from [facility]  Attempts:  1st Attempt  Reason for unsuccessful TCM follow-up call:  Left voice message    Kathyrn Sheriff, RN, MSN, BSN, CCM [facility] Care Management Coordinator [phone]

## 2020-10-02 ENCOUNTER — Telehealth: Payer: Self-pay

## 2020-10-02 NOTE — Telephone Encounter (Signed)
Transition Care Management Follow-up Telephone Call Date of discharge and from where: 09/27/20  How have you been since you were released from the hospital? Reports doing good Any questions or concerns? No  Items Reviewed: Did the pt receive and understand the discharge instructions provided? Yes  Medications obtained and verified? Yes  Other? No  Any new allergies since your discharge? No  Dietary orders reviewed? Yes Do you have support at home? Yes   Home Care and Equipment/Supplies: Were home health services ordered? no If so, what is the name of the agency? na  Has the agency set up a time to come to the patient's home? not applicable Were any new equipment or medical supplies ordered?  No What is the name of the medical supply agency? na Were you able to get the supplies/equipment? not applicable Do you have any questions related to the use of the equipment or supplies? No  Functional Questionnaire: (I = Independent and D = Dependent) ADLs: I  Bathing/Dressing- I  Meal Prep- I  Eating- I  Maintaining continence- I  Transferring/Ambulation- I  Managing Meds- I  Follow up appointments reviewed:  PCP Hospital f/u appt confirmed? Yes  Scheduled to see 10/09/20 at 9:30. Specialist Hospital f/u appt confirmed? No  per discharge instructions: Neurology follow up in 4 weeks. Office to call to schedule . Are transportation arrangements needed? No  If their condition worsens, is the pt aware to call PCP or go to the Emergency Dept.? Yes Was the patient provided with contact information for the PCP's office or ED? Yes Was to pt encouraged to call back with questions or concerns? Yes    Kathyrn Sheriff, RN, MSN, BSN, CCM [facility] Care Management Coordinator [phone]

## 2020-10-09 ENCOUNTER — Other Ambulatory Visit: Payer: Self-pay

## 2020-10-09 ENCOUNTER — Ambulatory Visit (INDEPENDENT_AMBULATORY_CARE_PROVIDER_SITE_OTHER): Payer: BC Managed Care – PPO | Admitting: Internal Medicine

## 2020-10-09 ENCOUNTER — Encounter: Payer: Self-pay | Admitting: Internal Medicine

## 2020-10-09 DIAGNOSIS — I499 Cardiac arrhythmia, unspecified: Secondary | ICD-10-CM

## 2020-10-09 DIAGNOSIS — E7800 Pure hypercholesterolemia, unspecified: Secondary | ICD-10-CM | POA: Diagnosis not present

## 2020-10-09 DIAGNOSIS — I639 Cerebral infarction, unspecified: Secondary | ICD-10-CM

## 2020-10-09 DIAGNOSIS — E559 Vitamin D deficiency, unspecified: Secondary | ICD-10-CM

## 2020-10-09 DIAGNOSIS — E860 Dehydration: Secondary | ICD-10-CM | POA: Diagnosis not present

## 2020-10-09 LAB — BASIC METABOLIC PANEL
BUN: 14 mg/dL (ref 6–23)
CO2: 31 mEq/L (ref 19–32)
Calcium: 9.4 mg/dL (ref 8.4–10.5)
Chloride: 99 mEq/L (ref 96–112)
Creatinine, Ser: 1.05 mg/dL (ref 0.40–1.50)
GFR: 71.16 mL/min (ref >60.00)
Glucose, Bld: 86 mg/dL (ref 70–99)
Potassium: 4.9 mEq/L (ref 3.5–5.1)
Sodium: 134 mEq/L — ABNORMAL LOW (ref 135–145)

## 2020-10-09 NOTE — Patient Instructions (Addendum)
Ok to HOLD the eliquis and lipitor for now with no direct evidence of recent stroke, and Afib not well documented?  You will be contacted regarding the referral for: Cardiology as you may benefit from a heart monitor for a period of time?  Please continue all other medications as before, and refills have been done if requested.  Please have the pharmacy call with any other refills you may need.  Please continue your efforts at being more active, low cholesterol diet, and weight control.  Please keep your appointments with your specialists as you may have planned  Please go to the LAB at the blood drawing area for the tests to be done  You will be contacted by phone if any changes need to be made immediately.  Otherwise, you will receive a letter about your results with an explanation, but please check with MyChart first.  Please remember to sign up for MyChart if you have not done so, as this will be important to you in the future with finding out test results, communicating by private email, and scheduling acute appointments online when needed.

## 2020-10-09 NOTE — Progress Notes (Signed)
Patient ID: Alexander Pierce, male   DOB: [birth year], 70 y.o.   MRN: [id number]        Chief Complaint: follow up recent episode hyponatremia, low volume, FUO with confusion, And stroke ruled out       HPI:  Alexander Pierce is a 70 y.o. male here with above, initially thought to have stroke but MRI neg and d/c summary for some reason seems to still emphasize this; pt o/w hosp with FUO (no source found), low sodium/volume/confusion now resolved and pt does not believe he had TIA or Stroke and does not feel compelled to f/u with neurology.  Pt denies chest pain, increased sob or doe, wheezing, orthopnea, PND, increased LE swelling, palpitations, dizziness or syncope.   Pt denies polydipsia, polyuria, or new focal neuro s/s.   Pt denies fever, wt loss, night sweats, loss of appetite, or other constitutional symptoms  Since home since sept 10, denies new complaints.  Denies worsening reflux, abd pain, dysphagia, n/v, bowel change or blood.  Did also have documented transient afib and plans to f/u with cardiology - ? Needs long term anticoagulation.  For now, pt declines to take eliquis and lipitor.      Wt Readings from Last 3 Encounters:  10/09/20 203 lb (92.1 kg)  09/24/20 212 lb 1.3 oz (96.2 kg)  09/03/20 213 lb (96.6 kg)   BP Readings from Last 3 Encounters:  10/09/20 128/76  09/27/20 (!) 152/94  09/03/20 122/86         Past Medical History:  Diagnosis Date   ALLERGIC RHINITIS 06/29/2007   Qualifier: Diagnosis of  By: Jonny Ruiz MD, Len Blalock    Allergy    seasonal   HYPERLIPIDEMIA 06/29/2007   Qualifier: Diagnosis of  By: Jonny Ruiz MD, Len Blalock    Right inguinal hernia 06/24/2011   Past Surgical History:  Procedure Laterality Date   INGUINAL HERNIA REPAIR     sinus surgury  1992   dr Haroldine Laws    reports that he has never smoked. He has never used smokeless tobacco. He reports that he does not drink alcohol and does not use drugs. family history includes Hypertension in an other family member; Parkinsonism  in an other family member; Prostate cancer in an other family member. Allergies  Allergen Reactions   Acetaminophen     REACTION: eyes swell Per family, patient takes Goody's powders   Aspirin     REACTION: eyes swell   Ibuprofen     REACTION: eyes swell   Penicillins     REACTION: pt cant remember   Current Outpatient Medications on File Prior to Visit  Medication Sig Dispense Refill   Cholecalciferol (QC VITAMIN D3) 50 MCG (2000 UT) TABS Take 2,000 Units by mouth daily.     COD LIVER OIL PO Take by mouth.     POTASSIUM PO Take 250 mg by mouth daily.     apixaban (ELIQUIS) 5 MG TABS tablet Take 1 tablet (5 mg total) by mouth 2 (two) times daily. (Patient not taking: Reported on 10/09/2020) 60 tablet 2   atorvastatin (LIPITOR) 20 MG tablet Take 1 tablet (20 mg total) by mouth daily. (Patient not taking: Reported on 10/09/2020) 30 tablet 1   No current facility-administered medications on file prior to visit.        ROS:  All others reviewed and negative.  Objective        PE:  BP 128/76 (BP Location: Right Arm, Patient Position: Sitting, Cuff Size: Large)  Pulse 76   Temp 99.1 F (37.3 C) (Oral)   Ht 6\' 2"  (1.88 m)   Wt 203 lb (92.1 kg)   SpO2 94%   BMI 26.06 kg/m                 Constitutional: Pt appears in NAD               HENT: Head: NCAT.                Right Ear: External ear normal.                 Left Ear: External ear normal.                Eyes: . Pupils are equal, round, and reactive to light. Conjunctivae and EOM are normal               Nose: without d/c or deformity               Neck: Neck supple. Gross normal ROM               Cardiovascular: Normal rate and regular rhythm.                 Pulmonary/Chest: Effort normal and breath sounds without rales or wheezing.                Abd:  Soft, NT, ND, + BS, no organomegaly               Neurological: Pt is alert. At baseline orientation, motor grossly intact               Skin: Skin is warm. No rashes, no  other new lesions, LE edema - none               Psychiatric: Pt behavior is normal without agitation   Micro: none  Cardiac tracings I have personally interpreted today:  none  Pertinent Radiological findings (summarize): none   Lab Results  Component Value Date   WBC 7.9 09/27/2020   HGB 12.5 (L) 09/27/2020   HCT 35.9 (L) 09/27/2020   PLT 293 09/27/2020   GLUCOSE 86 10/09/2020   CHOL 149 09/23/2020   TRIG 53 09/23/2020   HDL 42 09/23/2020   LDLCALC 96 09/23/2020   ALT 24 09/22/2020   AST 34 09/22/2020   NA 134 (L) 10/09/2020   K 4.9 10/09/2020   CL 99 10/09/2020   CREATININE 1.05 10/09/2020   BUN 14 10/09/2020   CO2 31 10/09/2020   TSH 0.854 09/23/2020   PSA 5.16 (H) 04/24/2020   INR 1.3 (H) 09/22/2020   HGBA1C 5.9 (H) 09/23/2020   Assessment/Plan:  Alexander Pierce is a 70 y.o. White or Caucasian [1] male with  has a past medical history of ALLERGIC RHINITIS (06/29/2007), Allergy, HYPERLIPIDEMIA (06/29/2007), and Right inguinal hernia (06/24/2011).  Arrhythmia Ok for referral cardiology to help assess if pt needs chronic anticoagulation related to transient afib - pt currently HOLDing eliquis  Hyperlipidemia Lab Results  Component Value Date   LDLCALC 96 09/23/2020   Goal ldl < 100 for now, pt decilnes current lipitor as does not believe he had tia or cva   Vitamin D deficiency Last vitamin D Lab Results  Component Value Date   VD25OH 28.73 (L) 04/24/2020   Low, reminded to start oral replacement   Dehydration With low sodium 117 and confusion - for f/u bmp  Followup: Return  if symptoms worsen or fail to improve.  Alexander Barre, MD 10/12/2020 6:31 PM [facility]

## 2020-10-12 ENCOUNTER — Encounter: Payer: Self-pay | Admitting: Internal Medicine

## 2020-10-12 NOTE — Assessment & Plan Note (Signed)
Last vitamin D Lab Results  Component Value Date   VD25OH 28.73 (L) 04/24/2020   Low, reminded to start oral replacement  

## 2020-10-12 NOTE — Assessment & Plan Note (Signed)
With low sodium 117 and confusion - for f/u bmp

## 2020-10-12 NOTE — Assessment & Plan Note (Signed)
Lab Results  Component Value Date   LDLCALC 96 09/23/2020   Goal ldl < 100 for now, pt decilnes current lipitor as does not believe he had tia or cva

## 2020-10-12 NOTE — Assessment & Plan Note (Addendum)
Ok for referral cardiology to help assess if pt needs chronic anticoagulation related to transient afib - pt currently HOLDing eliquis

## 2020-11-04 NOTE — Progress Notes (Deleted)
[facility] [address] [place] [address] Phone: [phone] Subjective:    I'm seeing this patient by the request  of:  [name], MD  CC:   [id number]  09/03/2020 Patient is doing much better overall at this moment.  I would not make any significant changes.  Patient has no significant swelling.  Patient is ambulating well.  Wearing a compression sleeve.  Follow-up with me again 2 months  Update 11/05/2020 Alexander Pierce is a 70 y.o. male coming in with complaint of B knee pain. Synvisc One authorized. Patient states   Onset-  Location Duration-  Character- Aggravating factors- Reliving factors-  Therapies tried-  Severity-     Past Medical History:  Diagnosis Date   ALLERGIC RHINITIS 06/29/2007   Qualifier: Diagnosis of  By: Jonny Ruiz MD, Len Blalock    Allergy    seasonal   HYPERLIPIDEMIA 06/29/2007   Qualifier: Diagnosis of  By: Jonny Ruiz MD, Len Blalock    Right inguinal hernia 06/24/2011   Past Surgical History:  Procedure Laterality Date   INGUINAL HERNIA REPAIR     sinus surgury  1992   dr Haroldine Laws   Social History   Socioeconomic History   Marital status: Married    Spouse name: Not on file   Number of children: Not on file   Years of education: Not on file   Highest education level: Not on file  Occupational History   Not on file  Tobacco Use   Smoking status: Never   Smokeless tobacco: Never  Substance and Sexual Activity   Alcohol use: No   Drug use: No   Sexual activity: Not on file  Other Topics Concern   Not on file  Social History Narrative   Not on file   Social Determinants of Health   Financial Resource Strain: Not on file  Food Insecurity: Not on file  Transportation Needs: Not on file  Physical Activity: Not on file  Stress: Not on file  Social Connections: Not on file   Allergies  Allergen Reactions   Acetaminophen     REACTION: eyes swell Per family, patient takes Goody's powders   Aspirin      REACTION: eyes swell   Ibuprofen     REACTION: eyes swell   Penicillins     REACTION: pt cant remember   Family History  Problem Relation Age of Onset   Parkinsonism Other    Prostate cancer Other    Hypertension Other    Colon cancer Neg Hx    Rectal cancer Neg Hx    Stomach cancer Neg Hx      Current Outpatient Medications (Cardiovascular):    atorvastatin (LIPITOR) 20 MG tablet, Take 1 tablet (20 mg total) by mouth daily. (Patient not taking: Reported on 10/09/2020)    Current Outpatient Medications (Hematological):    apixaban (ELIQUIS) 5 MG TABS tablet, Take 1 tablet (5 mg total) by mouth 2 (two) times daily. (Patient not taking: Reported on 10/09/2020)  Current Outpatient Medications (Other):    Cholecalciferol (QC VITAMIN D3) 50 MCG (2000 UT) TABS, Take 2,000 Units by mouth daily.   COD LIVER OIL PO, Take by mouth.   POTASSIUM PO, Take 250 mg by mouth daily.   Reviewed prior external information including notes and imaging from  primary care provider As well as notes that were available from care everywhere and other healthcare systems.  Past medical history, social, surgical and family history all reviewed  in electronic medical record.  No pertanent information unless stated regarding to the chief complaint.   Review of Systems:  No headache, visual changes, nausea, vomiting, diarrhea, constipation, dizziness, abdominal pain, skin rash, fevers, chills, night sweats, weight loss, swollen lymph nodes, body aches, joint swelling, chest pain, shortness of breath, mood changes. POSITIVE muscle aches  Objective  There were no vitals taken for this visit.   General: No apparent distress alert and oriented x3 mood and affect normal, dressed appropriately.  HEENT: Pupils equal, extraocular movements intact  Respiratory: Patient's speak in full sentences and does not appear short of breath  Cardiovascular: No lower extremity edema, non tender, no erythema  Gait normal  with good balance and coordination.  MSK:  Non tender with full range of motion and good stability and symmetric strength and tone of shoulders, elbows, wrist, hip, knee and ankles bilaterally.     Impression and Recommendations:     The above documentation has been reviewed and is accurate and complete Wilford Grist

## 2020-11-05 ENCOUNTER — Ambulatory Visit: Payer: BC Managed Care – PPO | Admitting: Family Medicine

## 2020-11-11 ENCOUNTER — Other Ambulatory Visit: Payer: Self-pay

## 2020-11-11 ENCOUNTER — Encounter: Payer: Self-pay | Admitting: Family Medicine

## 2020-11-11 ENCOUNTER — Ambulatory Visit (INDEPENDENT_AMBULATORY_CARE_PROVIDER_SITE_OTHER): Payer: BC Managed Care – PPO | Admitting: Family Medicine

## 2020-11-11 DIAGNOSIS — M170 Bilateral primary osteoarthritis of knee: Secondary | ICD-10-CM | POA: Diagnosis not present

## 2020-11-11 NOTE — Progress Notes (Signed)
[name] [address] [place] [address] Phone: [phone] Subjective:   Alexander Pierce, am serving as a scribe for Dr. Antoine Primas.  This visit occurred during the SARS-CoV-2 public health emergency.  Safety protocols were in place, including screening questions prior to the visit, additional usage of staff PPE, and extensive cleaning of exam room while observing appropriate contact time as indicated for disinfecting solutions.   I'm seeing this patient by the request  of:  Alexander Levins, MD  CC: Bilateral knee pain  [id number]  09/03/2020 Patient is doing much better overall at this moment.  I would not make any significant changes.  Patient has no significant swelling.  Patient is ambulating well.  Wearing a compression sleeve.  Follow-up with me again 2 months  Update 11/12/2020 Alexander Pierce is a 70 y.o. male coming in with complaint of B knee pain. Patient is doing well. Was able to walk 5k without pain. Feeling tightness in L knee over lateral aspect. Patient has been traveling a lot by car in past week.        Past Medical History:  Diagnosis Date   ALLERGIC RHINITIS 06/29/2007   Qualifier: Diagnosis of  By: Jonny Ruiz MD, Len Blalock    Allergy    seasonal   HYPERLIPIDEMIA 06/29/2007   Qualifier: Diagnosis of  By: Jonny Ruiz MD, Len Blalock    Right inguinal hernia 06/24/2011   Past Surgical History:  Procedure Laterality Date   INGUINAL HERNIA REPAIR     sinus surgury  1992   dr Haroldine Laws   Social History   Socioeconomic History   Marital status: Married    Spouse name: Not on file   Number of children: Not on file   Years of education: Not on file   Highest education level: Not on file  Occupational History   Not on file  Tobacco Use   Smoking status: Never   Smokeless tobacco: Never  Substance and Sexual Activity   Alcohol use: No   Drug use: No   Sexual activity: Not on file  Other Topics Concern   Not on file  Social  History Narrative   Not on file   Social Determinants of Health   Financial Resource Strain: Not on file  Food Insecurity: Not on file  Transportation Needs: Not on file  Physical Activity: Not on file  Stress: Not on file  Social Connections: Not on file   Allergies  Allergen Reactions   Acetaminophen     REACTION: eyes swell Per family, patient takes Goody's powders   Aspirin     REACTION: eyes swell   Ibuprofen     REACTION: eyes swell   Penicillins     REACTION: pt cant remember   Family History  Problem Relation Age of Onset   Parkinsonism Other    Prostate cancer Other    Hypertension Other    Colon cancer Neg Hx    Rectal cancer Neg Hx    Stomach cancer Neg Hx      Current Outpatient Medications (Cardiovascular):    atorvastatin (LIPITOR) 20 MG tablet, Take 1 tablet (20 mg total) by mouth daily.    Current Outpatient Medications (Hematological):    apixaban (ELIQUIS) 5 MG TABS tablet, Take 1 tablet (5 mg total) by mouth 2 (two) times daily.  Current Outpatient Medications (Other):    Cholecalciferol (QC VITAMIN D3) 50 MCG (2000 UT) TABS, Take 2,000 Units by mouth daily.  COD LIVER OIL PO, Take by mouth.   POTASSIUM PO, Take 250 mg by mouth daily.   Reviewed prior external information including notes and imaging from  primary care provider this includes patient's most recent primary care follow-up after the emergency room visit for the potential for a CVA that seems to be more secondary to a hyponatremia.     Objective  Blood pressure 124/86, pulse 67, height 6\' 2"  (1.88 m), weight 203 lb (92.1 kg), SpO2 97 %.   General: No apparent distress alert and oriented x3 mood and affect normal, dressed appropriately.  HEENT: Pupils equal, extraocular movements intact  Respiratory: Patient's speak in full sentences and does not appear short of breath  Cardiovascular: No lower extremity edema, non tender, no erythema  Gait normal with good balance and  coordination.  MSK: Patient still has mild tenderness to palpation of the knees bilaterally.  Patient does have instability with valgus and varus force.  Trace effusion noted of the left knee but patient has had a worse previously.    Impression and Recommendations:     The above documentation has been reviewed and is accurate and complete , DO

## 2020-11-11 NOTE — Assessment & Plan Note (Signed)
Patient at the moment seems to be doing well.  We discussed the potential for aspiration again but patient declined.  Feels like he is doing relatively well.  Patient wants to hold on the viscosupplementation but we will consider it in the follow-up.  Follow-up with me again in 2 months

## 2020-12-08 NOTE — Progress Notes (Signed)
[name] [address] [place] [address] Phone: [phone] Subjective:   [name], am serving as a scribe for Dr. Antoine Primas. This visit occurred during the SARS-CoV-2 public health emergency.  Safety protocols were in place, including screening questions prior to the visit, additional usage of staff PPE, and extensive cleaning of exam room while observing appropriate contact time as indicated for disinfecting solutions.   I'm seeing this patient by the request  of:  Alexander Levins, MD  CC: Knee pain left greater than right  [id number]  11/11/2020 Patient at the moment seems to be doing well.  We discussed the potential for aspiration again but patient declined.  Feels like he is doing relatively well.  Patient wants to hold on the viscosupplementation but we will consider it in the follow-up.  Follow-up with me again in 2 months  Update 12/09/2020 Alexander Pierce is a 70 y.o. male coming in with complaint of B knee pain. Patient here as he feels that she is having L knee swelling. Patient states left knee at one point the knot on his knee was a little bigger. Fluid builds up, but gets better. No new complaints patient states that he can usually do most of daily activities fine.  At the end of the day seems to have more swelling.  If he does more activity seems to be worse.  At the moment feels like he is doing better.      Past Medical History:  Diagnosis Date   ALLERGIC RHINITIS 06/29/2007   Qualifier: Diagnosis of  By: Jonny Ruiz MD, Len Blalock    Allergy    seasonal   HYPERLIPIDEMIA 06/29/2007   Qualifier: Diagnosis of  By: Jonny Ruiz MD, Len Blalock    Right inguinal hernia 06/24/2011   Past Surgical History:  Procedure Laterality Date   INGUINAL HERNIA REPAIR     sinus surgury  1992   dr Haroldine Laws   Social History   Socioeconomic History   Marital status: Married    Spouse name: Not on file   Number of children: Not on file   Years of  education: Not on file   Highest education level: Not on file  Occupational History   Not on file  Tobacco Use   Smoking status: Never   Smokeless tobacco: Never  Substance and Sexual Activity   Alcohol use: No   Drug use: No   Sexual activity: Not on file  Other Topics Concern   Not on file  Social History Narrative   Not on file   Social Determinants of Health   Financial Resource Strain: Not on file  Food Insecurity: Not on file  Transportation Needs: Not on file  Physical Activity: Not on file  Stress: Not on file  Social Connections: Not on file   Allergies  Allergen Reactions   Acetaminophen     REACTION: eyes swell Per family, patient takes Goody's powders   Aspirin     REACTION: eyes swell   Ibuprofen     REACTION: eyes swell   Penicillins     REACTION: pt cant remember   Family History  Problem Relation Age of Onset   Parkinsonism Other    Prostate cancer Other    Hypertension Other    Colon cancer Neg Hx    Rectal cancer Neg Hx    Stomach cancer Neg Hx      Current Outpatient Medications (Cardiovascular):    atorvastatin (LIPITOR) 20  MG tablet, Take 1 tablet (20 mg total) by mouth daily.    Current Outpatient Medications (Hematological):    apixaban (ELIQUIS) 5 MG TABS tablet, Take 1 tablet (5 mg total) by mouth 2 (two) times daily.  Current Outpatient Medications (Other):    Cholecalciferol (QC VITAMIN D3) 50 MCG (2000 UT) TABS, Take 2,000 Units by mouth daily.   COD LIVER OIL PO, Take by mouth.   POTASSIUM PO, Take 250 mg by mouth daily.   Reviewed prior external information including notes and imaging from  primary care provider As well as notes that were available from care everywhere and other healthcare systems.  Past medical history, social, surgical and family history all reviewed in electronic medical record.  No pertanent information unless stated regarding to the chief complaint.   Review of Systems:  No headache, visual  changes, nausea, vomiting, diarrhea, constipation, dizziness, abdominal pain, skin rash, fevers, chills, night sweats, weight loss, swollen lymph nodes, body aches, joint swelling, chest pain, shortness of breath, mood changes. POSITIVE muscle aches  Objective  Blood pressure 136/88, pulse 71, height 6\' 2"  (1.88 m), weight 209 lb (94.8 kg), SpO2 97 %.   General: No apparent distress alert and oriented x3 mood and affect normal, dressed appropriately.  HEENT: Pupils equal, extraocular movements intact  Respiratory: Patient's speak in full sentences and does not appear short of breath  Cardiovascular: No lower extremity edema, non tender, no erythema  Gait normal with good balance and coordination.  MSK: Left knee exam does have effusion noted.  Some mild tenderness on exam today.  Still instability noted with valgus and varus force.  Lacks last 5 degrees of flexion.   Limited muscular skeletal ultrasound was performed and interpreted by , M  Limited ultrasound of patient's left knee shows the patient does have a synovitis noted.  Trace effusion also noted the patellofemoral joint.  Continued underlying arthritic changes.     Impression and Recommendations:     The above documentation has been reviewed and is accurate and complete Antoine Primas, DO

## 2020-12-09 ENCOUNTER — Ambulatory Visit (INDEPENDENT_AMBULATORY_CARE_PROVIDER_SITE_OTHER): Payer: BC Managed Care – PPO | Admitting: Family Medicine

## 2020-12-09 ENCOUNTER — Encounter: Payer: Self-pay | Admitting: Family Medicine

## 2020-12-09 ENCOUNTER — Ambulatory Visit: Payer: Self-pay

## 2020-12-09 ENCOUNTER — Other Ambulatory Visit: Payer: Self-pay

## 2020-12-09 VITALS — BP 136/88 | HR 71 | Ht 74.0 in | Wt 209.0 lb

## 2020-12-09 DIAGNOSIS — M25569 Pain in unspecified knee: Secondary | ICD-10-CM | POA: Diagnosis not present

## 2020-12-09 DIAGNOSIS — I639 Cerebral infarction, unspecified: Secondary | ICD-10-CM

## 2020-12-09 DIAGNOSIS — M170 Bilateral primary osteoarthritis of knee: Secondary | ICD-10-CM

## 2020-12-09 NOTE — Patient Instructions (Signed)
You know where we are in case you need [place] See you again in 5-6 weeks just in case

## 2020-12-09 NOTE — Assessment & Plan Note (Addendum)
Patient at the moment is doing relatively well.  He still does have the swelling noted in the area.  Patient states that it is better at the moment and will do more of the compression.  He still does not like to have the injections too frequently.  Discussed with patient about potential compression.  He still wants to hold on any surgical intervention.  Wants to hold on the viscosupplementation as well.  Can call us if he changes his mind.  Have him follow-up in 5 to 6 weeks just in case but can change it if necessary.

## 2021-01-01 ENCOUNTER — Other Ambulatory Visit: Payer: Self-pay

## 2021-01-01 NOTE — Patient Outreach (Signed)
[facility]) Care Management  01/01/2021  Alexander Pierce 30-Jan-1950 [id number]   First telephone outreach attempt to obtain mRS. No answer. Left message for returned call.  [name] [facility] Management Assistant [phone]

## 2021-01-05 ENCOUNTER — Other Ambulatory Visit: Payer: Self-pay

## 2021-01-05 NOTE — Patient Outreach (Signed)
[facility]) Care Management  01/05/2021  Alexander Pierce Jul 25, 1950 [id number]   Second telephone outreach attempt to obtain mRS. No answer. Left message for returned call.  [name] [facility] Management Assistant [phone]

## 2021-01-08 ENCOUNTER — Other Ambulatory Visit: Payer: Self-pay

## 2021-01-08 NOTE — Patient Outreach (Signed)
[facility]) Care Management  01/08/2021  Alexander Pierce 1950/12/21 [id number]   3 outreach attempts were completed to obtain mRs. mRs could not be obtained because patient never returned my calls. mRs=7    [name] Care Management Assistant [phone]

## 2021-01-13 NOTE — Progress Notes (Deleted)
[name] [address] [place] [address] Phone: [phone] Subjective:    I'm seeing this patient by the request  of:  [name], MD  CC: Knee pain follow-up  [id number]  12/09/2020 Patient at the moment is doing relatively well.  He still does have the swelling noted in the area.  Patient states that it is better at the moment and will do more of the compression.  He still does not like to have the injections too frequently.  Discussed with patient about potential compression.  He still wants to hold on any surgical intervention.  Wants to hold on the viscosupplementation as well.  Can call us if he changes his mind.  Have him follow-up in 5 to 6 weeks just in case but can change it if necessary.  Update 01/14/2021 DAMAN STEFFENHAGEN is a 70 y.o. male coming in with complaint of B knee pain. Patient states        Past Medical History:  Diagnosis Date   ALLERGIC RHINITIS 06/29/2007   Qualifier: Diagnosis of  By: Jonny Ruiz MD, Len Blalock    Allergy    seasonal   HYPERLIPIDEMIA 06/29/2007   Qualifier: Diagnosis of  By: Jonny Ruiz MD, Len Blalock    Right inguinal hernia 06/24/2011   Past Surgical History:  Procedure Laterality Date   INGUINAL HERNIA REPAIR     sinus surgury  1992   dr Haroldine Laws   Social History   Socioeconomic History   Marital status: Married    Spouse name: Not on file   Number of children: Not on file   Years of education: Not on file   Highest education level: Not on file  Occupational History   Not on file  Tobacco Use   Smoking status: Never   Smokeless tobacco: Never  Substance and Sexual Activity   Alcohol use: No   Drug use: No   Sexual activity: Not on file  Other Topics Concern   Not on file  Social History Narrative   Not on file   Social Determinants of Health   Financial Resource Strain: Not on file  Food Insecurity: Not on file  Transportation Needs: Not on file  Physical Activity: Not on file   Stress: Not on file  Social Connections: Not on file   Allergies  Allergen Reactions   Acetaminophen     REACTION: eyes swell Per family, patient takes Goody's powders   Aspirin     REACTION: eyes swell   Ibuprofen     REACTION: eyes swell   Penicillins     REACTION: pt cant remember   Family History  Problem Relation Age of Onset   Parkinsonism Other    Prostate cancer Other    Hypertension Other    Colon cancer Neg Hx    Rectal cancer Neg Hx    Stomach cancer Neg Hx      Current Outpatient Medications (Cardiovascular):    atorvastatin (LIPITOR) 20 MG tablet, Take 1 tablet (20 mg total) by mouth daily.    Current Outpatient Medications (Hematological):    apixaban (ELIQUIS) 5 MG TABS tablet, Take 1 tablet (5 mg total) by mouth 2 (two) times daily.  Current Outpatient Medications (Other):    Cholecalciferol (QC VITAMIN D3) 50 MCG (2000 UT) TABS, Take 2,000 Units by mouth daily.   COD LIVER OIL PO, Take by mouth.   POTASSIUM PO, Take 250 mg by mouth daily.   Reviewed prior external information  including notes and imaging from  primary care provider As well as notes that were available from care everywhere and other healthcare systems.  Past medical history, social, surgical and family history all reviewed in electronic medical record.  No pertanent information unless stated regarding to the chief complaint.   Review of Systems:  No headache, visual changes, nausea, vomiting, diarrhea, constipation, dizziness, abdominal pain, skin rash, fevers, chills, night sweats, weight loss, swollen lymph nodes, body aches, joint swelling, chest pain, shortness of breath, mood changes. POSITIVE muscle aches  Objective  There were no vitals taken for this visit.   General: No apparent distress alert and oriented x3 mood and affect normal, dressed appropriately.  HEENT: Pupils equal, extraocular movements intact  Respiratory: Patient's speak in full sentences and does not  appear short of breath  Cardiovascular: No lower extremity edema, non tender, no erythema  Gait normal with good balance and coordination.  MSK:     Impression and Recommendations:     The above documentation has been reviewed and is accurate and complete Judi Saa, DO

## 2021-01-14 ENCOUNTER — Ambulatory Visit: Payer: BC Managed Care – PPO | Admitting: Family Medicine

## 2021-01-28 NOTE — Progress Notes (Signed)
[facility] [address] [place] [address] Phone: [phone] Subjective:   Alexander Pierce, am serving as a scribe for Dr. Antoine Primas. This visit occurred during the SARS-CoV-2 public health emergency.  Safety protocols were in place, including screening questions prior to the visit, additional usage of staff PPE, and extensive cleaning of exam room while observing appropriate contact time as indicated for disinfecting solutions.   I'm seeing this patient by the request  of:  Alexander Levins, MD  CC: Knee pain follow-up  [id number]  12/09/2020 Patient at the moment is doing relatively well.  He still does have the swelling noted in the area.  Patient states that it is better at the moment and will do more of the compression.  He still does not like to have the injections too frequently.  Discussed with patient about potential compression.  He still wants to hold on any surgical intervention.  Wants to hold on the viscosupplementation as well.  Can call us if he changes his mind.  Have him follow-up in 5 to 6 weeks just in case but can change it if necessary.  Update 01/30/2021 Alexander Pierce is a 71 y.o. male coming in with complaint of B knee pain. Patient states doing better than the last visit. No new complaints.  Patient states that from time to time has some swelling but nothing that is severe.  Nothing that is stopping him from activity.  Patient denies any increase in instability.     Past Medical History:  Diagnosis Date   ALLERGIC RHINITIS 06/29/2007   Qualifier: Diagnosis of  By: Jonny Ruiz MD, Len Blalock    Allergy    seasonal   HYPERLIPIDEMIA 06/29/2007   Qualifier: Diagnosis of  By: Jonny Ruiz MD, Len Blalock    Right inguinal hernia 06/24/2011   Past Surgical History:  Procedure Laterality Date   INGUINAL HERNIA REPAIR     sinus surgury  1992   dr Haroldine Laws   Social History   Socioeconomic History   Marital status: Married    Spouse name: Not  on file   Number of children: Not on file   Years of education: Not on file   Highest education level: Not on file  Occupational History   Not on file  Tobacco Use   Smoking status: Never   Smokeless tobacco: Never  Substance and Sexual Activity   Alcohol use: No   Drug use: No   Sexual activity: Not on file  Other Topics Concern   Not on file  Social History Narrative   Not on file   Social Determinants of Health   Financial Resource Strain: Not on file  Food Insecurity: Not on file  Transportation Needs: Not on file  Physical Activity: Not on file  Stress: Not on file  Social Connections: Not on file   Allergies  Allergen Reactions   Acetaminophen     REACTION: eyes swell Per family, patient takes Goody's powders   Aspirin     REACTION: eyes swell   Ibuprofen     REACTION: eyes swell   Penicillins     REACTION: pt cant remember   Family History  Problem Relation Age of Onset   Parkinsonism Other    Prostate cancer Other    Hypertension Other    Colon cancer Neg Hx    Rectal cancer Neg Hx    Stomach cancer Neg Hx      Current Outpatient Medications (Cardiovascular):  atorvastatin (LIPITOR) 20 MG tablet, Take 1 tablet (20 mg total) by mouth daily.    Current Outpatient Medications (Hematological):    apixaban (ELIQUIS) 5 MG TABS tablet, Take 1 tablet (5 mg total) by mouth 2 (two) times daily.  Current Outpatient Medications (Other):    Cholecalciferol (QC VITAMIN D3) 50 MCG (2000 UT) TABS, Take 2,000 Units by mouth daily.   COD LIVER OIL PO, Take by mouth.   POTASSIUM PO, Take 250 mg by mouth daily.   Reviewed prior external information including notes and imaging from  primary care provider As well as notes that were available from care everywhere and other healthcare systems.  Past medical history, social, surgical and family history all reviewed in electronic medical record.  No pertanent information unless stated regarding to the chief  complaint.   Review of Systems:  No headache, visual changes, nausea, vomiting, diarrhea, constipation, dizziness, abdominal pain, skin rash, fevers, chills, night sweats, weight loss, swollen lymph nodes, body aches, joint swelling, chest pain, shortness of breath, mood changes. POSITIVE muscle aches  Objective  Blood pressure 122/80, pulse (!) 56, height 6\' 2"  (1.88 m), weight 207 lb (93.9 kg), SpO2 97 %.   General: No apparent distress alert and oriented x3 mood and affect normal, dressed appropriately.  HEENT: Pupils equal, extraocular movements intact  Respiratory: Patient's speak in full sentences and does not appear short of breath  Cardiovascular: No lower extremity edema, non tender, no erythema  Gait very minorly antalgic.  Instability noted with valgus and varus force.  Patient does have some minor tenderness over the medial and lateral joint lines.  Limited muscular skeletal ultrasound was performed and interpreted by , M  Limited ultrasound still shows the patient does have severe osteoarthritic changes within 3 compartments.  Patient has only a trace effusion noted though.  No significant reaccumulation of any of the swelling.  No new findings. 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      Impression and Recommendations:     The above documentation has been reviewed and is accurate and complete Antoine Primas, DO

## 2021-01-30 ENCOUNTER — Ambulatory Visit (INDEPENDENT_AMBULATORY_CARE_PROVIDER_SITE_OTHER): Payer: BC Managed Care – PPO | Admitting: Family Medicine

## 2021-01-30 ENCOUNTER — Encounter: Payer: Self-pay | Admitting: Family Medicine

## 2021-01-30 ENCOUNTER — Ambulatory Visit: Payer: BC Managed Care – PPO

## 2021-01-30 ENCOUNTER — Other Ambulatory Visit: Payer: Self-pay

## 2021-01-30 VITALS — BP 122/80 | HR 56 | Ht 74.0 in | Wt 207.0 lb

## 2021-01-30 DIAGNOSIS — M170 Bilateral primary osteoarthritis of knee: Secondary | ICD-10-CM

## 2021-01-30 NOTE — Patient Instructions (Signed)
Lets keep watching it Approval through March 13th See you again in 6 weeks

## 2021-01-30 NOTE — Assessment & Plan Note (Signed)
Patient has been doing relatively well at this time.  Discussed the possibility of viscosupplementation again.  Patient wants to hold at this time but does have approval until March 2023.  Patient will come back again in 6 weeks for further evaluation and see if we want to continue it or otherwise if still doing well we will continue to monitor.

## 2021-03-18 NOTE — Progress Notes (Signed)
?Terrilee Files D.O. ?[facility] ?[address] [place] [address] ?Phone: [phone] ?Subjective:   ?I, Wilford Grist, am serving as a scribe for Dr. Antoine Primas. ? ?This visit occurred during the SARS-CoV-2 public health emergency.  Safety protocols were in place, including screening questions prior to the visit, additional usage of staff PPE, and extensive cleaning of exam room while observing appropriate contact time as indicated for disinfecting solutions.  ? ? ?I'm seeing this patient by the request  of:  [name], MD ? ?CC: Knee pain follow-up ? ?[id number]  ?01/30/2021 ?Patient has been doing relatively well at this time.  Discussed the possibility of viscosupplementation again.  Patient wants to hold at this time but does have approval until March 2023.  Patient will come back again in 6 weeks for further evaluation and see if we want to continue it or otherwise if still doing well we will continue to monitor. ? ?Update 03/19/2021 ?Alexander Pierce is a 71 y.o. male coming in with complaint of L knee pain. Patient states that he has discomfort but not pain. Able to move most of the time without. ? ?  ? ?Past Medical History:  ?Diagnosis Date  ? ALLERGIC RHINITIS 06/29/2007  ? Qualifier: Diagnosis of  By: Jonny Ruiz MD, Len Blalock   ? Allergy   ? seasonal  ? HYPERLIPIDEMIA 06/29/2007  ? Qualifier: Diagnosis of  By: Jonny Ruiz MD, Len Blalock   ? Right inguinal hernia 06/24/2011  ? ?Past Surgical History:  ?Procedure Laterality Date  ? INGUINAL HERNIA REPAIR    ? sinus surgury  1992  ? dr Haroldine Laws  ? ?Social History  ? ?Socioeconomic History  ? Marital status: Married  ?  Spouse name: Not on file  ? Number of children: Not on file  ? Years of education: Not on file  ? Highest education level: Not on file  ?Occupational History  ? Not on file  ?Tobacco Use  ? Smoking status: Never  ? Smokeless tobacco: Never  ?Substance and Sexual Activity  ? Alcohol use: No  ? Drug use: No  ? Sexual activity: Not on file   ?Other Topics Concern  ? Not on file  ?Social History Narrative  ? Not on file  ? ?Social Determinants of Health  ? ?Financial Resource Strain: Not on file  ?Food Insecurity: Not on file  ?Transportation Needs: Not on file  ?Physical Activity: Not on file  ?Stress: Not on file  ?Social Connections: Not on file  ? ?Allergies  ?Allergen Reactions  ? Acetaminophen   ?  REACTION: eyes swell ?Per family, patient takes Goody's powders  ? Aspirin   ?  REACTION: eyes swell  ? Ibuprofen   ?  REACTION: eyes swell  ? Penicillins   ?  REACTION: pt cant remember  ? ?Family History  ?Problem Relation Age of Onset  ? Parkinsonism Other   ? Prostate cancer Other   ? Hypertension Other   ? Colon cancer Neg Hx   ? Rectal cancer Neg Hx   ? Stomach cancer Neg Hx   ? ? ? ?Current Outpatient Medications (Cardiovascular):  ?  atorvastatin (LIPITOR) 20 MG tablet, Take 1 tablet (20 mg total) by mouth daily. ? ? ? ?Current Outpatient Medications (Hematological):  ?  apixaban (ELIQUIS) 5 MG TABS tablet, Take 1 tablet (5 mg total) by mouth 2 (two) times daily. ? ?Current Outpatient Medications (Other):  ?  Cholecalciferol (QC VITAMIN D3) 50 MCG (2000 UT) TABS,  Take 2,000 Units by mouth daily. ?  COD LIVER OIL PO, Take by mouth. ?  POTASSIUM PO, Take 250 mg by mouth daily. ? ? ?Objective  ?Blood pressure 112/72, pulse (!) 52, height 6\' 2"  (1.88 m), weight 207 lb (93.9 kg), SpO2 96 %. ?  ?General: No apparent distress alert and oriented x3 mood and affect normal, dressed appropriately.  ?HEENT: Pupils equal, extraocular movements intact  ?Respiratory: Patient's speak in full sentences and does not appear short of breath  ?Cardiovascular: No lower extremity edema, non tender, no erythema  ?Gait normal with good balance and coordination.  ?MSK: Patient does have knee arthritis bilaterally.  Mild instability noted with valgus and varus force.  Patient does have some tenderness to palpation in the medial compartment.  Patient does have improvement  in range of motion and less effusion than patient's normal baseline especially in the left ? ?  ?Impression and Recommendations:  ?  ? ?The above documentation has been reviewed and is accurate and complete Lyndal Pulley, DO ? ? ? ?

## 2021-03-19 ENCOUNTER — Other Ambulatory Visit: Payer: Self-pay

## 2021-03-19 ENCOUNTER — Encounter: Payer: Self-pay | Admitting: Family Medicine

## 2021-03-19 ENCOUNTER — Ambulatory Visit (INDEPENDENT_AMBULATORY_CARE_PROVIDER_SITE_OTHER): Payer: BC Managed Care – PPO | Admitting: Family Medicine

## 2021-03-19 DIAGNOSIS — M170 Bilateral primary osteoarthritis of knee: Secondary | ICD-10-CM

## 2021-03-19 NOTE — Assessment & Plan Note (Signed)
Continuing to do very well at the moment with conservative therapy.  Hold on any type of injections but to still have another follow-up in 2 to 3 months just in case.  Patient understands he can call sooner if any type of swelling occurs. ?

## 2021-06-18 NOTE — Progress Notes (Signed)
[name] [address] [place] [address] Phone: [phone] Subjective:   [name], am serving as a scribe for Dr. Antoine Primas.  I'm seeing this patient by the request  of:  [name], MD  CC: Knee pain follow-up left greater than right  Alexander Pierce  03/19/2021 Continuing to do very well at the moment with conservative therapy.  Hold on any type of injections but to still have another follow-up in 2 to 3 months just in case.  Patient understands he can call sooner if any type of swelling occurs.  Update 06/22/2021 VIR Pierce is a 71 y.o. male coming in with complaint of B knee pain. Patient states doing pretty well. Not having any real pains like before. Nothing severe pain. Sporadic tingling sometimes. No new complaints.  Patient was even able to increase activity recently.  Work on the farm and doing relatively well as well.      Past Medical History:  Diagnosis Date   ALLERGIC RHINITIS 06/29/2007   Qualifier: Diagnosis of  By: Jonny Ruiz MD, Len Blalock    Allergy    seasonal   HYPERLIPIDEMIA 06/29/2007   Qualifier: Diagnosis of  By: Jonny Ruiz MD, Len Blalock    Right inguinal hernia 06/24/2011   Past Surgical History:  Procedure Laterality Date   INGUINAL HERNIA REPAIR     sinus surgury  1992   dr Haroldine Laws   Social History   Socioeconomic History   Marital status: Married    Spouse name: Not on file   Number of children: Not on file   Years of education: Not on file   Highest education level: Not on file  Occupational History   Not on file  Tobacco Use   Smoking status: Never   Smokeless tobacco: Never  Substance and Sexual Activity   Alcohol use: No   Drug use: No   Sexual activity: Not on file  Other Topics Concern   Not on file  Social History Narrative   Not on file   Social Determinants of Health   Financial Resource Strain: Not on file  Food Insecurity: Not on file  Transportation Needs: Not on file  Physical  Activity: Not on file  Stress: Not on file  Social Connections: Not on file   Allergies  Allergen Reactions   Acetaminophen     REACTION: eyes swell Per family, patient takes Goody's powders   Aspirin     REACTION: eyes swell   Ibuprofen     REACTION: eyes swell   Penicillins     REACTION: pt cant remember   Family History  Problem Relation Age of Onset   Parkinsonism Other    Prostate cancer Other    Hypertension Other    Colon cancer Neg Hx    Rectal cancer Neg Hx    Stomach cancer Neg Hx      Current Outpatient Medications (Cardiovascular):    atorvastatin (LIPITOR) 20 MG tablet, Take 1 tablet (20 mg total) by mouth daily. (Patient not taking: Reported on 06/22/2021)    Current Outpatient Medications (Hematological):    apixaban (ELIQUIS) 5 MG TABS tablet, Take 1 tablet (5 mg total) by mouth 2 (two) times daily. (Patient not taking: Reported on 06/22/2021)  Current Outpatient Medications (Other):    Cholecalciferol (QC VITAMIN D3) 50 MCG (2000 UT) TABS, Take 2,000 Units by mouth daily.   COD LIVER OIL PO, Take by mouth. (Patient not taking: Reported on 06/22/2021)  POTASSIUM PO, Take 250 mg by mouth daily. (Patient not taking: Reported on 06/22/2021)    Objective  Blood pressure 116/74, pulse 64, height 6\' 2"  (1.88 m), weight 215 lb (97.5 kg), SpO2 96 %.   General: No apparent distress alert and oriented x3 mood and affect normal, dressed appropriately.  HEENT: Pupils equal, extraocular movements intact  Respiratory: Patient's speak in full sentences and does not appear short of breath  Cardiovascular: No lower extremity edema, non tender, no erythema  Gait very minorly antalgic Trace effusion noted of the left knee.  Mild crepitus noted. Mild instability with valgus and varus force.  Patient does have full range of motion noted.    Impression and Recommendations:     The above documentation has been reviewed and is accurate and complete ,  DO

## 2021-06-22 ENCOUNTER — Ambulatory Visit (INDEPENDENT_AMBULATORY_CARE_PROVIDER_SITE_OTHER): Payer: BC Managed Care – PPO | Admitting: Family Medicine

## 2021-06-22 DIAGNOSIS — M170 Bilateral primary osteoarthritis of knee: Secondary | ICD-10-CM | POA: Diagnosis not present

## 2021-06-22 NOTE — Patient Instructions (Signed)
Ice at night Happy with knee See me in 3 months

## 2021-06-22 NOTE — Assessment & Plan Note (Signed)
Known arthritic changes but patient wants to continue to monitor at this point.  Could be a candidate for viscosupplementation again but patient wants to hold.  Follow-up with me again in 3 to 4 months

## 2021-06-30 NOTE — Progress Notes (Signed)
[name] [address] [place] [address] Phone: [phone] Subjective:   Alexander Pierce, am serving as a scribe for Dr. Antoine Primas.  I'm seeing this patient by the request  of:  [name], MD  CC: bilateral knee   [id number]  06/22/2021 Known arthritic changes but patient wants to continue to monitor at this point.  Could be a candidate for viscosupplementation again but patient wants to hold.  Follow-up with me again in 3 to 4 months  Update 07/02/2021 Alexander Pierce is a 71 y.o. male coming in with complaint of B knee pain. Patient states that his L knee pain has increased over medial patella. Pain can radiate down the leg at times. Pain started one week ago.        Past Medical History:  Diagnosis Date   ALLERGIC RHINITIS 06/29/2007   Qualifier: Diagnosis of  By: Jonny Ruiz MD, Len Blalock    Allergy    seasonal   HYPERLIPIDEMIA 06/29/2007   Qualifier: Diagnosis of  By: Jonny Ruiz MD, Len Blalock    Right inguinal hernia 06/24/2011   Past Surgical History:  Procedure Laterality Date   INGUINAL HERNIA REPAIR     sinus surgury  1992   dr Haroldine Laws   Social History   Socioeconomic History   Marital status: Married    Spouse name: Not on file   Number of children: Not on file   Years of education: Not on file   Highest education level: Not on file  Occupational History   Not on file  Tobacco Use   Smoking status: Never   Smokeless tobacco: Never  Substance and Sexual Activity   Alcohol use: No   Drug use: No   Sexual activity: Not on file  Other Topics Concern   Not on file  Social History Narrative   Not on file   Social Determinants of Health   Financial Resource Strain: Not on file  Food Insecurity: Not on file  Transportation Needs: Not on file  Physical Activity: Not on file  Stress: Not on file  Social Connections: Not on file   Allergies  Allergen Reactions   Acetaminophen     REACTION: eyes swell Per family,  patient takes Goody's powders   Aspirin     REACTION: eyes swell   Ibuprofen     REACTION: eyes swell   Penicillins     REACTION: pt cant remember   Family History  Problem Relation Age of Onset   Parkinsonism Other    Prostate cancer Other    Hypertension Other    Colon cancer Neg Hx    Rectal cancer Neg Hx    Stomach cancer Neg Hx     Current Outpatient Medications (Endocrine & Metabolic):    predniSONE (DELTASONE) 20 MG tablet, Take 2 tablets (40 mg total) by mouth daily with breakfast.  Current Outpatient Medications (Cardiovascular):    atorvastatin (LIPITOR) 20 MG tablet, Take 1 tablet (20 mg total) by mouth daily.    Current Outpatient Medications (Hematological):    apixaban (ELIQUIS) 5 MG TABS tablet, Take 1 tablet (5 mg total) by mouth 2 (two) times daily.  Current Outpatient Medications (Other):    Cholecalciferol (QC VITAMIN D3) 50 MCG (2000 UT) TABS, Take 2,000 Units by mouth daily.   COD LIVER OIL PO, Take by mouth.   POTASSIUM PO, Take 250 mg by mouth daily.   Reviewed prior external information including notes and imaging from  primary care provider As well as notes that were available from care everywhere and other healthcare systems.  Past medical history, social, surgical and family history all reviewed in electronic medical record.  No pertanent information unless stated regarding to the chief complaint.   Review of Systems:  No headache, visual changes, nausea, vomiting, diarrhea, constipation, dizziness, abdominal pain, skin rash, fevers, chills, night sweats, weight loss, swollen lymph nodes, body aches, joint swelling, chest pain, shortness of breath, mood changes. POSITIVE muscle aches  Objective  Blood pressure 128/84, pulse 60, height 6\' 2"  (1.88 m), weight 214 lb (97.1 kg), SpO2 92 %.   General: No apparent distress alert and oriented x3 mood and affect normal, dressed appropriately.  HEENT: Pupils equal, extraocular movements intact   Respiratory: Patient's speak in full sentences and does not appear short of breath  Cardiovascular: No lower extremity edema, non tender, no erythema  Antalgic gait noted.  Patient's left knee does have some tenderness to palpation.  Instability with valgus and varus force but new and worsening pain on the internal aspect and medial aspect of the knee.  Positive McMurray's.  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  Hypoechoic changes noted. 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

## 2021-07-02 ENCOUNTER — Encounter: Payer: Self-pay | Admitting: Family Medicine

## 2021-07-02 ENCOUNTER — Ambulatory Visit (INDEPENDENT_AMBULATORY_CARE_PROVIDER_SITE_OTHER): Payer: BC Managed Care – PPO | Admitting: Family Medicine

## 2021-07-02 DIAGNOSIS — M170 Bilateral primary osteoarthritis of knee: Secondary | ICD-10-CM | POA: Diagnosis not present

## 2021-07-02 MED ORDER — PREDNISONE 20 MG PO TABS: 40.0000 mg | ORAL_TABLET | Freq: Every day | ORAL | 0 refills | Status: DC

## 2021-07-02 NOTE — Assessment & Plan Note (Signed)
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  Discussed the possibility of injections.  Patient wants to hold at this point.  Would like to consider the possibility of viscosupplementation but encouraged him not to do it today with the acute injury at the moment.  Patient will let this calm down and then do icing regularly and topical anti-inflammatories.  Follow-up with me again when he comes back in town in mid to late July early August.

## 2021-07-02 NOTE — Patient Instructions (Signed)
Ice at end of day Prednisone 40mg  for 5 days See me after the trip

## 2021-07-31 DIAGNOSIS — I7121 Aneurysm of the ascending aorta, without rupture: Secondary | ICD-10-CM | POA: Insufficient documentation

## 2021-08-27 NOTE — Progress Notes (Signed)
[name] [address] [place] [address] Phone: [phone] Subjective:   Alexander Pierce, am serving as a scribe for Dr. Antoine Primas.  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  Discussed the possibility of injections.  Patient wants to hold at this point.  Would like to consider the possibility of viscosupplementation but encouraged him not to do it today with the acute injury at the moment.  Patient will let this calm down and then do icing regularly and topical anti-inflammatories.  Follow-up with me again when he comes back in town in mid to late July early August.  Update 08/31/2021 Alexander Pierce is a 71 y.o. male coming in with complaint of B knee pain. Patient states that his pain has subsided since last visit. Little to no swelling.        Past Medical History:  Diagnosis Date   ALLERGIC RHINITIS 06/29/2007   Qualifier: Diagnosis of  By: Jonny Ruiz MD, Len Blalock    Allergy    seasonal   HYPERLIPIDEMIA 06/29/2007   Qualifier: Diagnosis of  By: Jonny Ruiz MD, Len Blalock    Right inguinal hernia 06/24/2011   Past Surgical History:  Procedure Laterality Date   INGUINAL HERNIA REPAIR     sinus surgury  1992   dr Haroldine Laws   Social History   Socioeconomic History   Marital status: Married    Spouse name: Not on file   Number of children: Not on file   Years of education: Not on file   Highest education level: Not on file  Occupational History   Not on file  Tobacco Use   Smoking status: Never   Smokeless tobacco: Never  Substance and Sexual Activity   Alcohol use: No   Drug use: No   Sexual activity: Not on file  Other Topics Concern   Not on file  Social History Narrative   Not on file   Social Determinants of Health   Financial  Resource Strain: Not on file  Food Insecurity: Not on file  Transportation Needs: Not on file  Physical Activity: Not on file  Stress: Not on file  Social Connections: Not on file   Allergies  Allergen Reactions   Acetaminophen     REACTION: eyes swell Per family, patient takes Goody's powders   Aspirin     REACTION: eyes swell   Ibuprofen     REACTION: eyes swell   Penicillins     REACTION: pt cant remember   Family History  Problem Relation Age of Onset   Parkinsonism Other    Prostate cancer Other    Hypertension Other    Colon cancer Neg Hx    Rectal cancer Neg Hx    Stomach cancer Neg Hx     Current Outpatient Medications (Endocrine & Metabolic):    predniSONE (DELTASONE) 20 MG tablet, Take 2 tablets (40 mg total) by mouth daily with breakfast.  Current Outpatient Medications (Cardiovascular):    atorvastatin (LIPITOR) 20 MG tablet, Take 1 tablet (20 mg total) by mouth daily.    Current Outpatient Medications (Hematological):    apixaban (ELIQUIS) 5 MG TABS tablet, Take 1 tablet (5 mg total) by mouth 2 (two) times daily.  Current Outpatient Medications (Other):  Cholecalciferol (QC VITAMIN D3) 50 MCG (2000 UT) TABS, Take 2,000 Units by mouth daily.   COD LIVER OIL PO, Take by mouth.   POTASSIUM PO, Take 250 mg by mouth daily.    Objective  Blood pressure 118/80, pulse (!) 56, height 6\' 2"  (1.88 m), weight 218 lb (98.9 kg), SpO2 97 %.   General: No apparent distress alert and oriented x3 mood and affect normal, dressed appropriately.  HEENT: Pupils equal, extraocular movements intact  Respiratory: Patient's speak in full sentences and does not appear short of breath  Cardiovascular: No lower extremity edema, non tender, no erythema  Knee exam shows the patient does have some instability with valgus and varus force.  Patient does have a trace effusion noted of the left knee in the patellofemoral joint.  Near full range of motion.  Patient is nontender in  both knees.    Impression and Recommendations:     The above documentation has been reviewed and is accurate and complete , DO

## 2021-08-31 ENCOUNTER — Encounter: Payer: Self-pay | Admitting: Family Medicine

## 2021-08-31 ENCOUNTER — Ambulatory Visit (INDEPENDENT_AMBULATORY_CARE_PROVIDER_SITE_OTHER): Payer: BC Managed Care – PPO | Admitting: Family Medicine

## 2021-08-31 DIAGNOSIS — M170 Bilateral primary osteoarthritis of knee: Secondary | ICD-10-CM

## 2021-08-31 NOTE — Assessment & Plan Note (Signed)
Patient continues to do extremely well with conservative therapy.  No significant changes.  Will have follow-up as scheduled again in 2 to 3 months but if doing well we will continue to hold.  Otherwise patient could be a be candidate for viscosupplementation which we already have approval for.

## 2021-11-16 NOTE — Progress Notes (Deleted)
[facility] [address] [place] [address] Phone: [phone] Subjective:    I'm seeing this patient by the request  of:  Alexander Levins, MD  CC:   [id number]  08/31/2021 Patient continues to do extremely well with conservative therapy.  No significant changes.  Will have follow-up as scheduled again in 2 to 3 months but if doing well we will continue to hold.  Otherwise patient could be a be candidate for viscosupplementation which we already have approval for.  Update 11/17/2021 Alexander Pierce is a 71 y.o. male coming in with complaint of B knee pain. Patient states         Past Medical History:  Diagnosis Date   ALLERGIC RHINITIS 06/29/2007   Qualifier: Diagnosis of  By: Jonny Ruiz MD, Len Blalock    Allergy    seasonal   HYPERLIPIDEMIA 06/29/2007   Qualifier: Diagnosis of  By: Jonny Ruiz MD, Len Blalock    Right inguinal hernia 06/24/2011   Past Surgical History:  Procedure Laterality Date   INGUINAL HERNIA REPAIR     sinus surgury  1992   dr Haroldine Laws   Social History   Socioeconomic History   Marital status: Married    Spouse name: Not on file   Number of children: Not on file   Years of education: Not on file   Highest education level: Not on file  Occupational History   Not on file  Tobacco Use   Smoking status: Never   Smokeless tobacco: Never  Substance and Sexual Activity   Alcohol use: No   Drug use: No   Sexual activity: Not on file  Other Topics Concern   Not on file  Social History Narrative   Not on file   Social Determinants of Health   Financial Resource Strain: Not on file  Food Insecurity: Not on file  Transportation Needs: Not on file  Physical Activity: Not on file  Stress: Not on file  Social Connections: Not on file   Allergies  Allergen Reactions   Acetaminophen     REACTION: eyes swell Per family, patient takes Goody's powders   Aspirin     REACTION: eyes swell   Ibuprofen     REACTION: eyes swell    Penicillins     REACTION: pt cant remember   Family History  Problem Relation Age of Onset   Parkinsonism Other    Prostate cancer Other    Hypertension Other    Colon cancer Neg Hx    Rectal cancer Neg Hx    Stomach cancer Neg Hx     Current Outpatient Medications (Endocrine & Metabolic):    predniSONE (DELTASONE) 20 MG tablet, Take 2 tablets (40 mg total) by mouth daily with breakfast.  Current Outpatient Medications (Cardiovascular):    atorvastatin (LIPITOR) 20 MG tablet, Take 1 tablet (20 mg total) by mouth daily.    Current Outpatient Medications (Hematological):    apixaban (ELIQUIS) 5 MG TABS tablet, Take 1 tablet (5 mg total) by mouth 2 (two) times daily.  Current Outpatient Medications (Other):    Cholecalciferol (QC VITAMIN D3) 50 MCG (2000 UT) TABS, Take 2,000 Units by mouth daily.   COD LIVER OIL PO, Take by mouth.   POTASSIUM PO, Take 250 mg by mouth daily.   Reviewed prior external information including notes and imaging from  primary care provider As well as notes that were available from care everywhere and other healthcare systems.  Past medical history,  social, surgical and family history all reviewed in electronic medical record.  No pertanent information unless stated regarding to the chief complaint.   Review of Systems:  No headache, visual changes, nausea, vomiting, diarrhea, constipation, dizziness, abdominal pain, skin rash, fevers, chills, night sweats, weight loss, swollen lymph nodes, body aches, joint swelling, chest pain, shortness of breath, mood changes. POSITIVE muscle aches  Objective  There were no vitals taken for this visit.   General: No apparent distress alert and oriented x3 mood and affect normal, dressed appropriately.  HEENT: Pupils equal, extraocular movements intact  Respiratory: Patient's speak in full sentences and does not appear short of breath  Cardiovascular: No lower extremity edema, non tender, no erythema       Impression and Recommendations:

## 2021-11-23 ENCOUNTER — Ambulatory Visit: Payer: BC Managed Care – PPO | Admitting: Family Medicine

## 2021-12-07 LAB — HM DIABETES EYE EXAM

## 2021-12-14 ENCOUNTER — Encounter: Payer: Self-pay | Admitting: Internal Medicine

## 2022-03-22 ENCOUNTER — Ambulatory Visit (INDEPENDENT_AMBULATORY_CARE_PROVIDER_SITE_OTHER): Payer: BC Managed Care – PPO | Admitting: Internal Medicine

## 2022-03-22 ENCOUNTER — Encounter: Payer: Self-pay | Admitting: Internal Medicine

## 2022-03-22 ENCOUNTER — Ambulatory Visit (INDEPENDENT_AMBULATORY_CARE_PROVIDER_SITE_OTHER)
Admission: RE | Admit: 2022-03-22 | Discharge: 2022-03-22 | Disposition: A | Discharge status: Home / Self Care | Payer: BC Managed Care – PPO | Source: Ambulatory Visit | Attending: Internal Medicine | Admitting: Internal Medicine

## 2022-03-22 VITALS — BP 130/82 | HR 77 | Temp 98.3°F | Ht 74.0 in | Wt 220.0 lb

## 2022-03-22 DIAGNOSIS — E559 Vitamin D deficiency, unspecified: Secondary | ICD-10-CM

## 2022-03-22 DIAGNOSIS — R739 Hyperglycemia, unspecified: Secondary | ICD-10-CM

## 2022-03-22 DIAGNOSIS — R058 Other specified cough: Secondary | ICD-10-CM

## 2022-03-22 DIAGNOSIS — R0989 Other specified symptoms and signs involving the circulatory and respiratory systems: Secondary | ICD-10-CM | POA: Diagnosis not present

## 2022-03-22 MED ORDER — GUAIFENESIN-DM 100-10 MG/5ML PO SYRP: 5.0000 mL | ORAL_SOLUTION | ORAL | 0 refills | Status: DC | PRN

## 2022-03-22 MED ORDER — AZITHROMYCIN 250 MG PO TABS: ORAL_TABLET | ORAL | 1 refills | Status: AC

## 2022-03-22 NOTE — Assessment & Plan Note (Signed)
Lab Results  Component Value Date   HGBA1C 5.9 (H) 09/23/2020   Stable, pt to continue current medical treatment  - diet, wt control, declines f/u lab today

## 2022-03-22 NOTE — Progress Notes (Unsigned)
Patient ID: Alexander Pierce, male   DOB: 03-Jan-1951, 72 y.o.   MRN: [id number]        Chief Complaint: follow up prod cough , low vit d, lhyeprgclyemia       HPI:  Alexander Pierce is a 72 y.o. male Here with acute onset mild to mod 2-3 days ST, HA, general weakness and malaise, with prod cough greenish sputum, but Pt denies chest pain, increased sob or doe, wheezing, orthopnea, PND, increased LE swelling, palpitations, dizziness or syncope.   Pt denies polydipsia, polyuria, or new focal neuro s/s.    Pt denies fever, wt loss, night sweats, loss of appetite, or other constitutional symptoms          Wt Readings from Last 3 Encounters:  03/22/22 220 lb (99.8 kg)  08/31/21 218 lb (98.9 kg)  07/02/21 214 lb (97.1 kg)   BP Readings from Last 3 Encounters:  03/22/22 130/82  08/31/21 118/80  07/02/21 128/84         Past Medical History:  Diagnosis Date   ALLERGIC RHINITIS 06/29/2007   Qualifier: Diagnosis of  By: Jenny Reichmann MD, Hunt Oris    Allergy    seasonal   HYPERLIPIDEMIA 06/29/2007   Qualifier: Diagnosis of  By: Jenny Reichmann MD, Hunt Oris    Right inguinal hernia 06/24/2011   Past Surgical History:  Procedure Laterality Date   INGUINAL HERNIA REPAIR     sinus surgury  1992   dr Ernesto Rutherford    reports that he has never smoked. He has never used smokeless tobacco. He reports that he does not drink alcohol and does not use drugs. family history includes Hypertension in an other family member; Parkinsonism in an other family member; Prostate cancer in an other family member. Allergies  Allergen Reactions   Acetaminophen     REACTION: eyes swell Per family, patient takes Goody's powders   Aspirin     REACTION: eyes swell   Ibuprofen     REACTION: eyes swell   Penicillins     REACTION: pt cant remember   Current Outpatient Medications on File Prior to Visit  Medication Sig Dispense Refill   apixaban (ELIQUIS) 5 MG TABS tablet Take 1 tablet (5 mg total) by mouth 2 (two) times daily. 60 tablet 2    atorvastatin (LIPITOR) 20 MG tablet Take 1 tablet (20 mg total) by mouth daily. 30 tablet 1   Cholecalciferol (QC VITAMIN D3) 50 MCG (2000 UT) TABS Take 2,000 Units by mouth daily.     COD LIVER OIL PO Take by mouth.     metoprolol succinate (TOPROL-XL) 50 MG 24 hr tablet Take by mouth.     POTASSIUM PO Take 250 mg by mouth daily.     predniSONE (DELTASONE) 20 MG tablet Take 2 tablets (40 mg total) by mouth daily with breakfast. 10 tablet 0   losartan (COZAAR) 25 MG tablet Take 25 mg by mouth daily.     No current facility-administered medications on file prior to visit.        ROS:  All others reviewed and negative.  Objective        PE:  BP 130/82 (BP Location: Right Arm, Patient Position: Sitting, Cuff Size: Large)   Pulse 77   Temp 98.3 F (36.8 C) (Oral)   Ht '6\' 2"'$  (1.88 m)   Wt 220 lb (99.8 kg)   SpO2 96%   BMI 28.25 kg/m  Constitutional: Pt appears mild ill               HENT: Head: NCAT.                Right Ear: External ear normal.                 Left Ear: External ear normal. Bilat tm's with mild erythema.  Max sinus areas non tender.  Pharynx with mild erythema, no exudate               Eyes: . Pupils are equal, round, and reactive to light. Conjunctivae and EOM are normal               Nose: without d/c or deformity               Neck: Neck supple. Gross normal ROM               Cardiovascular: Normal rate and regular rhythm.                 Pulmonary/Chest: Effort normal and breath sounds decreased bilat without rales or wheezing.                               Neurological: Pt is alert. At baseline orientation, motor grossly intact               Skin: Skin is warm. No rashes, no other new lesions, LE edema - none               Psychiatric: Pt behavior is normal without agitation   Micro: none  Cardiac tracings I have personally interpreted today:  none  Pertinent Radiological findings (summarize): none   Lab Results  Component Value Date    WBC 7.9 09/27/2020   HGB 12.5 (L) 09/27/2020   HCT 35.9 (L) 09/27/2020   PLT 293 09/27/2020   GLUCOSE 86 10/09/2020   CHOL 149 09/23/2020   TRIG 53 09/23/2020   HDL 42 09/23/2020   LDLCALC 96 09/23/2020   ALT 24 09/22/2020   AST 34 09/22/2020   NA 134 (L) 10/09/2020   K 4.9 10/09/2020   CL 99 10/09/2020   CREATININE 1.05 10/09/2020   BUN 14 10/09/2020   CO2 31 10/09/2020   TSH 0.854 09/23/2020   PSA 5.16 (H) 04/24/2020   INR 1.3 (H) 09/22/2020   HGBA1C 5.9 (H) 09/23/2020   POCT - RSV - neg, Covid - neg, Flu - neg  Assessment/Plan:  Alexander Pierce is a 72 y.o. White or Caucasian [1] male with  has a past medical history of ALLERGIC RHINITIS (06/29/2007), Allergy, HYPERLIPIDEMIA (06/29/2007), and Right inguinal hernia (06/24/2011).  Hyperglycemia Lab Results  Component Value Date   HGBA1C 5.9 (H) 09/23/2020   Stable, pt to continue current medical treatment  - diet, wt control, declines f/u lab today  Productive cough Mild to mod, c/w bornchitis vs pna, for cxr, also for antibx course zpack and cough med prn,  to f/u any worsening symptoms or concerns   Vitamin D deficiency Last vitamin D Lab Results  Component Value Date   VD25OH 28.73 (L) 04/24/2020   Low, reminded to start oral replacement  Followup: Return if symptoms worsen or fail to improve.  Cathlean Cower, MD 03/22/2022 7:28 PM [facility]

## 2022-03-22 NOTE — Assessment & Plan Note (Signed)
Mild to mod, c/w bornchitis vs pna, for cxr, also for antibx course zpack and cough med prn,  to f/u any worsening symptoms or concerns

## 2022-03-22 NOTE — Assessment & Plan Note (Signed)
Last vitamin D Lab Results  Component Value Date   VD25OH 28.73 (L) 04/24/2020   Low, reminded to start oral replacement

## 2022-03-22 NOTE — Patient Instructions (Signed)
Please take all new medication as prescribed - the antibiotic, and cough medicine if needed  Please continue all other medications as before, and refills have been done if requested.  Please have the pharmacy call with any other refills you may need.  Please keep your appointments with your specialists as you may have planned  Please go to the XRAY Department in the first floor for the x-ray testing  You will be contacted by phone if any changes need to be made immediately.  Otherwise, you will receive a letter about your results with an explanation, but please check with MyChart first.

## 2022-03-23 ENCOUNTER — Encounter: Payer: Self-pay | Admitting: Internal Medicine

## 2022-03-23 LAB — POC INFLUENZA A&B (BINAX/QUICKVUE)
Influenza A, POC: NEGATIVE
Influenza B, POC: NEGATIVE

## 2022-03-23 LAB — POC SOFIA SARS ANTIGEN FIA: SARS Coronavirus 2 Ag: NEGATIVE

## 2022-03-23 LAB — POCT RESPIRATORY SYNCYTIAL VIRUS: RSV Rapid Ag: NEGATIVE

## 2022-08-27 DIAGNOSIS — Q2381 Bicuspid aortic valve: Secondary | ICD-10-CM | POA: Insufficient documentation

## 2023-01-28 ENCOUNTER — Encounter: Payer: Self-pay | Admitting: Internal Medicine

## 2023-01-28 ENCOUNTER — Ambulatory Visit (INDEPENDENT_AMBULATORY_CARE_PROVIDER_SITE_OTHER): Payer: Medicare Other | Admitting: Internal Medicine

## 2023-01-28 VITALS — BP 120/78 | HR 58 | Temp 97.7°F | Ht 74.0 in | Wt 222.0 lb

## 2023-01-28 DIAGNOSIS — E538 Deficiency of other specified B group vitamins: Secondary | ICD-10-CM

## 2023-01-28 DIAGNOSIS — R972 Elevated prostate specific antigen [PSA]: Secondary | ICD-10-CM

## 2023-01-28 DIAGNOSIS — Z125 Encounter for screening for malignant neoplasm of prostate: Secondary | ICD-10-CM

## 2023-01-28 DIAGNOSIS — I10 Essential (primary) hypertension: Secondary | ICD-10-CM | POA: Diagnosis not present

## 2023-01-28 DIAGNOSIS — N320 Bladder-neck obstruction: Secondary | ICD-10-CM

## 2023-01-28 DIAGNOSIS — E7800 Pure hypercholesterolemia, unspecified: Secondary | ICD-10-CM

## 2023-01-28 DIAGNOSIS — E559 Vitamin D deficiency, unspecified: Secondary | ICD-10-CM | POA: Diagnosis not present

## 2023-01-28 DIAGNOSIS — Z0001 Encounter for general adult medical examination with abnormal findings: Secondary | ICD-10-CM

## 2023-01-28 DIAGNOSIS — I350 Nonrheumatic aortic (valve) stenosis: Secondary | ICD-10-CM | POA: Insufficient documentation

## 2023-01-28 DIAGNOSIS — Q2381 Bicuspid aortic valve: Secondary | ICD-10-CM

## 2023-01-28 DIAGNOSIS — R739 Hyperglycemia, unspecified: Secondary | ICD-10-CM

## 2023-01-28 DIAGNOSIS — I7121 Aneurysm of the ascending aorta, without rupture: Secondary | ICD-10-CM | POA: Insufficient documentation

## 2023-01-28 LAB — CBC WITH DIFFERENTIAL/PLATELET
Basophils Absolute: 0 10*3/uL (ref 0.0–0.1)
Basophils Relative: 0.5 % (ref 0.0–3.0)
Eosinophils Absolute: 1 10*3/uL — ABNORMAL HIGH (ref 0.0–0.7)
Eosinophils Relative: 12.6 % — ABNORMAL HIGH (ref 0.0–5.0)
HCT: 42.7 % (ref 39.0–52.0)
Hemoglobin: 14.5 g/dL (ref 13.0–17.0)
Lymphocytes Relative: 24.3 % (ref 12.0–46.0)
Lymphs Abs: 1.9 10*3/uL (ref 0.7–4.0)
MCHC: 33.9 g/dL (ref 30.0–36.0)
MCV: 90.5 fL (ref 78.0–100.0)
Monocytes Absolute: 0.7 10*3/uL (ref 0.1–1.0)
Monocytes Relative: 9 % (ref 3.0–12.0)
Neutro Abs: 4.3 10*3/uL (ref 1.4–7.7)
Neutrophils Relative %: 53.6 % (ref 43.0–77.0)
Platelets: 326 10*3/uL (ref 150.0–400.0)
RBC: 4.73 Mil/uL (ref 4.22–5.81)
RDW: 13.5 % (ref 11.5–15.5)
WBC: 8 10*3/uL (ref 4.0–10.5)

## 2023-01-28 LAB — BASIC METABOLIC PANEL
BUN: 20 mg/dL (ref 6–23)
CO2: 30 meq/L (ref 19–32)
Calcium: 8.8 mg/dL (ref 8.4–10.5)
Chloride: 94 meq/L — ABNORMAL LOW (ref 96–112)
Creatinine, Ser: 1.16 mg/dL (ref 0.40–1.50)
GFR: 62.12 mL/min (ref >60.00)
Glucose, Bld: 93 mg/dL (ref 70–99)
Potassium: 4.9 meq/L (ref 3.5–5.1)
Sodium: 130 meq/L — ABNORMAL LOW (ref 135–145)

## 2023-01-28 LAB — URINALYSIS, ROUTINE W REFLEX MICROSCOPIC
Bilirubin Urine: NEGATIVE
Hgb urine dipstick: NEGATIVE
Ketones, ur: NEGATIVE
Leukocytes,Ua: NEGATIVE
Nitrite: NEGATIVE
RBC / HPF: NONE SEEN (ref 0–?)
Specific Gravity, Urine: 1.01 (ref 1.000–1.030)
Total Protein, Urine: NEGATIVE
Urine Glucose: NEGATIVE
Urobilinogen, UA: 0.2 (ref 0.0–1.0)
pH: 7 (ref 5.0–8.0)

## 2023-01-28 LAB — LIPID PANEL
Cholesterol: 173 mg/dL (ref 0–200)
HDL: 29.6 mg/dL — ABNORMAL LOW (ref >39.00)
LDL Cholesterol: 99 mg/dL (ref 0–99)
NonHDL: 143.04
Total CHOL/HDL Ratio: 6
Triglycerides: 220 mg/dL — ABNORMAL HIGH (ref 0.0–149.0)
VLDL: 44 mg/dL — ABNORMAL HIGH (ref 0.0–40.0)

## 2023-01-28 LAB — HEPATIC FUNCTION PANEL
ALT: 18 U/L (ref 0–53)
AST: 18 U/L (ref 0–37)
Albumin: 4.3 g/dL (ref 3.5–5.2)
Alkaline Phosphatase: 76 U/L (ref 39–117)
Bilirubin, Direct: 0.1 mg/dL (ref 0.0–0.3)
Total Bilirubin: 0.4 mg/dL (ref 0.2–1.2)
Total Protein: 7.1 g/dL (ref 6.0–8.3)

## 2023-01-28 LAB — VITAMIN D 25 HYDROXY (VIT D DEFICIENCY, FRACTURES): VITD: 26.3 ng/mL — ABNORMAL LOW (ref 30.00–100.00)

## 2023-01-28 LAB — HEMOGLOBIN A1C: Hgb A1c MFr Bld: 6.4 % (ref 4.6–6.5)

## 2023-01-28 LAB — TSH: TSH: 1 u[IU]/mL (ref 0.35–5.50)

## 2023-01-28 LAB — VITAMIN B12: Vitamin B-12: 424 pg/mL (ref 211–911)

## 2023-01-28 LAB — PSA: PSA: 9.77 ng/mL — ABNORMAL HIGH (ref 0.10–4.00)

## 2023-01-28 NOTE — Patient Instructions (Signed)
 Metta Sours with your Surgury!  Please continue all other medications as before, and refills have been done if requested.  Please have the pharmacy call with any other refills you may need.  Please continue your efforts at being more active, low cholesterol diet, and weight control.  You are otherwise up to date with prevention measures today.  Please keep your appointments with your specialists as you may have planned  Please go to the LAB at the blood drawing area for the tests to be done  You will be contacted by phone if any changes need to be made immediately.  Otherwise, you will receive a letter about your results with an explanation, but please check with MyChart first.  Please make an Appointment to return for your 1 year visit, or sooner if needed

## 2023-01-28 NOTE — Progress Notes (Signed)
 Patient ID: Alexander Pierce, male   DOB: 1950/05/22, 73 y.o.   MRN: [id number]         Chief Complaint:: yearly exam and hyperglycemia, hld, low vit d, htn       HPI:  Alexander Pierce is a 73 y.o. male here for above,   Also now for next month planned ascending aortic aneurysm and bicuspic valve with plan for aneurysm repair and bicuspic valve repair with pig valve.  Pt denies chest pain, increased sob or doe, wheezing, orthopnea, PND, increased LE swelling, palpitations, dizziness or syncope.   Pt denies polydipsia, polyuria, or new focal neuro s/s.    Pt denies fever, wt loss, night sweats, loss of appetite, or other constitutional symptoms  Decliens pneumovax and flu shot, for shignrx at pharmacy   Wt Readings from Last 3 Encounters:  01/28/23 222 lb (100.7 kg)  03/22/22 220 lb (99.8 kg)  08/31/21 218 lb (98.9 kg)   BP Readings from Last 3 Encounters:  01/28/23 120/78  03/22/22 130/82  08/31/21 118/80   Immunization History  Administered Date(s) Administered   DTaP 01/19/2008   Tdap 07/20/2018   Health Maintenance Due  Topic Date Due   Medicare Annual Wellness (AWV)  Never done   Pneumonia Vaccine 40+ Years old (1 of 2 - PCV) Never done   Zoster Vaccines- Shingrix (1 of 2) Never done   INFLUENZA VACCINE  Never done      Past Medical History:  Diagnosis Date   ALLERGIC RHINITIS 06/29/2007   Qualifier: Diagnosis of  By: Norleen MD, Lynwood ORN    Allergy    seasonal   HYPERLIPIDEMIA 06/29/2007   Qualifier: Diagnosis of  By: Norleen MD, Lynwood ORN    Right inguinal hernia 06/24/2011   Past Surgical History:  Procedure Laterality Date   INGUINAL HERNIA REPAIR     sinus surgury  1992   dr floy    reports that he has never smoked. He has never used smokeless tobacco. He reports that he does not drink alcohol and does not use drugs. family history includes Hypertension in an other family member; Parkinsonism in an other family member; Prostate cancer in an other family member. Allergies   Allergen Reactions   Quinolones Other (See Comments)    Fluroquinolone antibiotics should be avoided in patients with aortic disease unless alternative therapy is not an option.   Acetaminophen      REACTION: eyes swell Per family, patient takes Goody's powders   Aspirin      REACTION: eyes swell   Ibuprofen      REACTION: eyes swell   Penicillins     REACTION: pt cant remember   Current Outpatient Medications on File Prior to Visit  Medication Sig Dispense Refill   losartan (COZAAR) 25 MG tablet Take 25 mg by mouth daily.     metoprolol  succinate (TOPROL -XL) 50 MG 24 hr tablet Take by mouth.     apixaban  (ELIQUIS ) 5 MG TABS tablet Take 1 tablet (5 mg total) by mouth 2 (two) times daily. (Patient not taking: Reported on 01/28/2023) 60 tablet 2   atorvastatin  (LIPITOR) 20 MG tablet Take 1 tablet (20 mg total) by mouth daily. (Patient not taking: Reported on 01/28/2023) 30 tablet 1   Cholecalciferol (QC VITAMIN D3) 50 MCG (2000 UT) TABS Take 2,000 Units by mouth daily. (Patient not taking: Reported on 01/28/2023)     COD LIVER OIL PO Take by mouth. (Patient not taking: Reported on 01/28/2023)     guaiFENesin -dextromethorphan (  ROBITUSSIN DM) 100-10 MG/5ML syrup Take 5 mLs by mouth every 4 (four) hours as needed for cough. (Patient not taking: Reported on 01/28/2023) 118 mL 0   POTASSIUM PO Take 250 mg by mouth daily. (Patient not taking: Reported on 01/28/2023)     predniSONE  (DELTASONE ) 20 MG tablet Take 2 tablets (40 mg total) by mouth daily with breakfast. (Patient not taking: Reported on 01/28/2023) 10 tablet 0   No current facility-administered medications on file prior to visit.        ROS:  All others reviewed and negative.  Objective        PE:  BP 120/78 (BP Location: Right Arm, Patient Position: Sitting, Cuff Size: Normal)   Pulse (!) 58   Temp 97.7 F (36.5 C) (Oral)   Ht 6' 2 (1.88 m)   Wt 222 lb (100.7 kg)   SpO2 98%   BMI 28.50 kg/m                 Constitutional: Pt  appears in NAD               HENT: Head: NCAT.                Right Ear: External ear normal.                 Left Ear: External ear normal.                Eyes: . Pupils are equal, round, and reactive to light. Conjunctivae and EOM are normal               Nose: without d/c or deformity               Neck: Neck supple. Gross normal ROM               Cardiovascular: Normal rate and regular rhythm.                 Pulmonary/Chest: Effort normal and breath sounds without rales or wheezing.                Abd:  Soft, NT, ND, + BS, no organomegaly               Neurological: Pt is alert. At baseline orientation, motor grossly intact               Skin: Skin is warm. No rashes, no other new lesions, LE edema - none               Psychiatric: Pt behavior is normal without agitation   Micro: none  Cardiac tracings I have personally interpreted today:  none  Pertinent Radiological findings (summarize): none   Lab Results  Component Value Date   WBC 8.0 01/28/2023   HGB 14.5 01/28/2023   HCT 42.7 01/28/2023   PLT 326.0 01/28/2023   GLUCOSE 93 01/28/2023   CHOL 173 01/28/2023   TRIG 220.0 (H) 01/28/2023   HDL 29.60 (L) 01/28/2023   LDLCALC 99 01/28/2023   ALT 18 01/28/2023   AST 18 01/28/2023   NA 130 (L) 01/28/2023   K 4.9 01/28/2023   CL 94 (L) 01/28/2023   CREATININE 1.16 01/28/2023   BUN 20 01/28/2023   CO2 30 01/28/2023   TSH 1.00 01/28/2023   PSA 9.77 (H) 01/28/2023   INR 1.3 (H) 09/22/2020   HGBA1C 6.4 01/28/2023   Assessment/Plan:  Alexander Pierce is a 73 y.o. White or  Caucasian [1] male with  has a past medical history of ALLERGIC RHINITIS (06/29/2007), Allergy, HYPERLIPIDEMIA (06/29/2007), and Right inguinal hernia (06/24/2011).  Hyperglycemia Lab Results  Component Value Date   HGBA1C 6.4 01/28/2023   Stable, pt to continue current medical treatment  - diet, wt control   Hyperlipidemia Lab Results  Component Value Date   LDLCALC 99 01/28/2023   Stable, pt to  continue current statin lipitor 20 mg   Hypertension BP Readings from Last 3 Encounters:  01/28/23 120/78  03/22/22 130/82  08/31/21 118/80   Stable, pt to continue medical treatment losartan 25 mg every day, toprol  xl 50 qd   Vitamin D  deficiency Last vitamin D  Lab Results  Component Value Date   VD25OH 26.30 (L) 01/28/2023   Low, to start oral replacement   Increased prostate specific antigen (PSA) velocity Lab Results  Component Value Date   PSA 9.77 (H) 01/28/2023   PSA 5.16 (H) 04/24/2020   PSA 5.20 (H) 02/27/2019   Worsening, will need referral to urology,  to f/u any worsening symptoms or concerns  Followup: Return in about 1 year (around 01/28/2024).  Lynwood Rush, MD 01/30/2023 9:27 PM [facility]

## 2023-01-30 ENCOUNTER — Encounter: Payer: Self-pay | Admitting: Internal Medicine

## 2023-01-30 NOTE — Assessment & Plan Note (Signed)
 Lab Results  Component Value Date   HGBA1C 6.4 01/28/2023   Stable, pt to continue current medical treatment  - diet, wt control

## 2023-01-30 NOTE — Assessment & Plan Note (Signed)
 Lab Results  Component Value Date   PSA 9.77 (H) 01/28/2023   PSA 5.16 (H) 04/24/2020   PSA 5.20 (H) 02/27/2019   Worsening, will need referral to urology,  to f/u any worsening symptoms or concerns

## 2023-01-30 NOTE — Assessment & Plan Note (Signed)
 Last vitamin D Lab Results  Component Value Date   VD25OH 26.30 (L) 01/28/2023   Low, to start oral replacement

## 2023-01-30 NOTE — Assessment & Plan Note (Signed)
 BP Readings from Last 3 Encounters:  01/28/23 120/78  03/22/22 130/82  08/31/21 118/80   Stable, pt to continue medical treatment losartan 25 mg every day, toprol xl 50 qd

## 2023-01-30 NOTE — Assessment & Plan Note (Signed)
 Lab Results  Component Value Date   LDLCALC 99 01/28/2023   Stable, pt to continue current statin lipitor 20 mg

## 2023-02-11 ENCOUNTER — Telehealth: Payer: Self-pay | Admitting: Internal Medicine

## 2023-02-11 NOTE — Telephone Encounter (Unsigned)
Copied from CRM [id number]. Topic: General - Other >> Feb 11, 2023  3:48 PM Almira Coaster wrote: Reason for CRM: Patient is calling to advise that he did receive the letter with lab results. He wanted to discuss and see if you had names to the urologist that he can see.

## 2023-02-11 NOTE — Telephone Encounter (Signed)
We normally refer to [facility], although there is a Dr Logan Bores associated with [facility] who is very good as well.  I can refer if he wants, thanks

## 2023-02-14 NOTE — Telephone Encounter (Signed)
Called and unable to leave voicemail

## 2023-02-28 DIAGNOSIS — Z952 Presence of prosthetic heart valve: Secondary | ICD-10-CM | POA: Insufficient documentation

## 2023-02-28 DIAGNOSIS — Z8679 Personal history of other diseases of the circulatory system: Secondary | ICD-10-CM | POA: Insufficient documentation

## 2023-02-28 DIAGNOSIS — D62 Acute posthemorrhagic anemia: Secondary | ICD-10-CM | POA: Insufficient documentation

## 2023-02-28 DIAGNOSIS — I2510 Atherosclerotic heart disease of native coronary artery without angina pectoris: Secondary | ICD-10-CM | POA: Insufficient documentation

## 2023-02-28 DIAGNOSIS — G8918 Other acute postprocedural pain: Secondary | ICD-10-CM | POA: Insufficient documentation

## 2023-02-28 DIAGNOSIS — D72829 Elevated white blood cell count, unspecified: Secondary | ICD-10-CM | POA: Insufficient documentation

## 2023-02-28 DIAGNOSIS — Z951 Presence of aortocoronary bypass graft: Secondary | ICD-10-CM | POA: Insufficient documentation

## 2023-03-05 DIAGNOSIS — I4891 Unspecified atrial fibrillation: Secondary | ICD-10-CM | POA: Insufficient documentation

## 2023-03-08 DIAGNOSIS — D696 Thrombocytopenia, unspecified: Secondary | ICD-10-CM | POA: Insufficient documentation

## 2023-03-08 DIAGNOSIS — R410 Disorientation, unspecified: Secondary | ICD-10-CM | POA: Insufficient documentation

## 2023-03-11 ENCOUNTER — Telehealth: Payer: Self-pay

## 2023-03-11 NOTE — Transitions of Care (Post Inpatient/ED Visit) (Signed)
   03/11/2023  Name: Alexander Pierce MRN: [id number] DOB: 19-Oct-1950  Today's TOC FU Call Status: Today's TOC FU Call Status:: Unsuccessful Call (1st Attempt) Unsuccessful Call (1st Attempt) Date: 03/11/23  Attempted to reach the patient regarding the most recent Inpatient/ED visit.  Follow Up Plan: Additional outreach attempts will be made to reach the patient to complete the Transitions of Care (Post Inpatient/ED visit) call.   Signature Karena Addison, LPN [facility] Nurse Health Advisor Direct Dial [phone]

## 2023-03-15 NOTE — Transitions of Care (Post Inpatient/ED Visit) (Signed)
 03/15/2023  Name: Alexander Pierce MRN: [id number] DOB: Jun 16, 1950  Today's TOC FU Call Status: Today's TOC FU Call Status:: Successful TOC FU Call Completed Unsuccessful Call (1st Attempt) Date: 03/11/23 [facility] FU Call Complete Date: 03/15/23 Patient's Name and Date of Birth confirmed.  Transition Care Management Follow-up Telephone Call Date of Discharge: 03/10/23 Discharge Facility: Other ([facility]) Name of Other ([facility]) Discharge Facility: [facility] Type of Discharge: Inpatient Admission Primary Inpatient Discharge Diagnosis:: hypo-osmolality How have you been since you were released from the hospital?: Better Any questions or concerns?: No  Items Reviewed: Did you receive and understand the discharge instructions provided?: Yes Medications obtained,verified, and reconciled?: Yes (Medications Reviewed) Any new allergies since your discharge?: No Dietary orders reviewed?: Yes Do you have support at home?: Yes People in Home: spouse  Medications Reviewed Today: Medications Reviewed Today     Reviewed by Karena Addison, LPN (Licensed Practical Nurse) on 03/15/23 at 1108  Med List Status: <None>   Medication Order Taking? Sig Documenting Provider Last Dose Status Informant  apixaban (ELIQUIS) 5 MG TABS tablet [id number]  Take 1 tablet (5 mg total) by mouth 2 (two) times daily.  Patient not taking: Reported on 01/28/2023   Suzan Garibaldi, PA-C  Active   atorvastatin (LIPITOR) 20 MG tablet [id number]  Take 1 tablet (20 mg total) by mouth daily.  Patient not taking: Reported on 01/28/2023   Suzan Garibaldi, PA-C  Active   Cholecalciferol (QC VITAMIN D3) 50 MCG (2000 UT) TABS [id number]  Take 2,000 Units by mouth daily.  Patient not taking: Reported on 01/28/2023   [provider]  Active Spouse/Significant Other  COD LIVER OIL PO [id number]  Take by mouth.  Patient not taking: Reported on 01/28/2023   [provider]  Active   guaiFENesin-dextromethorphan  [facility] DM) 100-10 MG/5ML syrup [id number]  Take 5 mLs by mouth every 4 (four) hours as needed for cough.  Patient not taking: Reported on 01/28/2023   [name], MD  Active   losartan (COZAAR) 25 MG tablet [id number]  Take 25 mg by mouth daily. [provider]  Active   metoprolol succinate (TOPROL-XL) 50 MG 24 hr tablet [id number]  Take by mouth. [provider]  Active   POTASSIUM PO [id number]  Take 250 mg by mouth daily.  Patient not taking: Reported on 01/28/2023   [provider]  Active   predniSONE (DELTASONE) 20 MG tablet [id number]  Take 2 tablets (40 mg total) by mouth daily with breakfast.  Patient not taking: Reported on 01/28/2023   Judi Saa, DO  Active   urea (URE-NA) 15 g PACK oral packet [id number] Yes Take 15 g by mouth daily. [provider]  Active             Home Care and Equipment/Supplies: Were Home Health Services Ordered?: NA Any new equipment or medical supplies ordered?: NA  Functional Questionnaire: Do you need assistance with bathing/showering or dressing?: No Do you need assistance with meal preparation?: No Do you need assistance with eating?: No Do you have difficulty maintaining continence: No Do you need assistance with getting out of bed/getting out of a chair/moving?: No Do you have difficulty managing or taking your medications?: No  Follow up appointments reviewed: PCP Follow-up appointment confirmed?: Yes Date of PCP follow-up appointment?: 03/17/23 Specialist Hospital Follow-up appointment confirmed?: Yes Date of Specialist follow-up appointment?: 03/23/23 Follow-Up Specialty Provider:: surgeon Do you need transportation to your follow-up appointment?: No Do you understand  care options if your condition(s) worsen?: Yes-patient verbalized understanding   SIGNATURE Karena Addison, LPN [facility] Nurse Health Advisor Direct Dial [phone]

## 2023-03-17 ENCOUNTER — Ambulatory Visit (INDEPENDENT_AMBULATORY_CARE_PROVIDER_SITE_OTHER): Payer: Medicare Other | Admitting: Internal Medicine

## 2023-03-17 ENCOUNTER — Encounter: Payer: Self-pay | Admitting: Internal Medicine

## 2023-03-17 VITALS — BP 120/70 | HR 80 | Temp 99.1°F | Ht 74.0 in | Wt 224.0 lb

## 2023-03-17 DIAGNOSIS — E871 Hypo-osmolality and hyponatremia: Secondary | ICD-10-CM | POA: Diagnosis not present

## 2023-03-17 DIAGNOSIS — N1831 Chronic kidney disease, stage 3a: Secondary | ICD-10-CM

## 2023-03-17 DIAGNOSIS — E7800 Pure hypercholesterolemia, unspecified: Secondary | ICD-10-CM

## 2023-03-17 DIAGNOSIS — D649 Anemia, unspecified: Secondary | ICD-10-CM | POA: Diagnosis not present

## 2023-03-17 DIAGNOSIS — E559 Vitamin D deficiency, unspecified: Secondary | ICD-10-CM

## 2023-03-17 DIAGNOSIS — E538 Deficiency of other specified B group vitamins: Secondary | ICD-10-CM

## 2023-03-17 DIAGNOSIS — R972 Elevated prostate specific antigen [PSA]: Secondary | ICD-10-CM | POA: Diagnosis not present

## 2023-03-17 DIAGNOSIS — R739 Hyperglycemia, unspecified: Secondary | ICD-10-CM

## 2023-03-17 LAB — BASIC METABOLIC PANEL
BUN: 28 mg/dL — ABNORMAL HIGH (ref 6–23)
CO2: 28 meq/L (ref 19–32)
Calcium: 8.3 mg/dL — ABNORMAL LOW (ref 8.4–10.5)
Chloride: 95 meq/L — ABNORMAL LOW (ref 96–112)
Creatinine, Ser: 1.16 mg/dL (ref 0.40–1.50)
GFR: 62.07 mL/min (ref >60.00)
Glucose, Bld: 98 mg/dL (ref 70–99)
Potassium: 5.4 meq/L — ABNORMAL HIGH (ref 3.5–5.1)
Sodium: 128 meq/L — ABNORMAL LOW (ref 135–145)

## 2023-03-17 LAB — HEPATIC FUNCTION PANEL
ALT: 41 U/L (ref 0–53)
AST: 23 U/L (ref 0–37)
Albumin: 3.5 g/dL (ref 3.5–5.2)
Alkaline Phosphatase: 122 U/L — ABNORMAL HIGH (ref 39–117)
Bilirubin, Direct: 0.1 mg/dL (ref 0.0–0.3)
Total Bilirubin: 0.4 mg/dL (ref 0.2–1.2)
Total Protein: 6.7 g/dL (ref 6.0–8.3)

## 2023-03-17 LAB — CBC WITH DIFFERENTIAL/PLATELET
Basophils Absolute: 0.1 10*3/uL (ref 0.0–0.1)
Basophils Relative: 1 % (ref 0.0–3.0)
Eosinophils Absolute: 0.4 10*3/uL (ref 0.0–0.7)
Eosinophils Relative: 4.2 % (ref 0.0–5.0)
HCT: 29 % — ABNORMAL LOW (ref 39.0–52.0)
Hemoglobin: 9.8 g/dL — ABNORMAL LOW (ref 13.0–17.0)
Lymphocytes Relative: 12 % (ref 12.0–46.0)
Lymphs Abs: 1 10*3/uL (ref 0.7–4.0)
MCHC: 33.6 g/dL (ref 30.0–36.0)
MCV: 89.5 fL (ref 78.0–100.0)
Monocytes Absolute: 1 10*3/uL (ref 0.1–1.0)
Monocytes Relative: 12.6 % — ABNORMAL HIGH (ref 3.0–12.0)
Neutro Abs: 5.9 10*3/uL (ref 1.4–7.7)
Neutrophils Relative %: 70.2 % (ref 43.0–77.0)
Platelets: 601 10*3/uL — ABNORMAL HIGH (ref 150.0–400.0)
RBC: 3.25 Mil/uL — ABNORMAL LOW (ref 4.22–5.81)
RDW: 14.1 % (ref 11.5–15.5)
WBC: 8.3 10*3/uL (ref 4.0–10.5)

## 2023-03-17 LAB — PSA: PSA: 16.16 ng/mL — ABNORMAL HIGH (ref 0.10–4.00)

## 2023-03-17 LAB — IBC PANEL
Iron: 25 ug/dL — ABNORMAL LOW (ref 42–165)
Saturation Ratios: 7.9 % — ABNORMAL LOW (ref 20.0–50.0)
TIBC: 315 ug/dL (ref 250.0–450.0)
Transferrin: 225 mg/dL (ref 212.0–360.0)

## 2023-03-17 LAB — FERRITIN: Ferritin: 500.4 ng/mL — ABNORMAL HIGH (ref 22.0–322.0)

## 2023-03-17 NOTE — Progress Notes (Signed)
 Patient ID: Alexander Pierce, male   DOB: 10/24/1950, 73 y.o.   MRN: [id number]        Chief Complaint: follow up post hospn feb 9 - 20 2025 with heart disease       HPI:  Alexander Pierce is a 73 y.o. male here after hospn with  Diagnoses  Aneurysm of ascending aorta without rupture (CMS-HCC)  Bicuspid aortic valve  Moderate aortic stenosis  Mild aortic insufficiency  Aneurysm of ascending aorta without rupture (CMS-HCC) [I71.21]  Bicuspid aortic valve [Q23.81]  Moderate aortic stenosis [I35.0]  Mild aortic insufficiency [I35.1]    Procedures  PR RPLCMT PROST AORTIC VALVE OPEN XCP HOMOGRF/STENT  PR AS-AORT GRF W/CARD BYP F/AORTIC DS OTH/THN DSJ  ADULT, REPLACEMENT, AORTIC VALVE, OPEN, WITH CARDIOPULMONARY BYPASS, STERNOTOMY; WITH PROSTHETIC VALVE OTHER THAN HOMOGRAFT OR STENTLESS VALVE  ASCENDING AORTA GRAFT, WITH CARDIOPULMONARY BYPASS, INCLUDES VALVE SUSPENSION, WHEN PERFORMED; FOR AORTIC DISEASE OTHER THAN DISSECTION (EG, ANEURYSM)      Here after a lifeline screening testing with u/s echo resulting in abnormal findings with ascending aortic aneurysm and bicuspid valve, verified with his own cardiologist, then referred for surgury Dr Romona Curls.  Overall doing well.  Pt denies chest pain, increased sob or doe, wheezing, orthopnea, PND, increased LE swelling, palpitations, dizziness or syncope.   Pt denies polydipsia, polyuria, or new focal neuro s/s.   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  Post op has afib transient, and hyponatremia.  D/c Na 133 after fluid restriction.        Transitional Care Management elements noted today: 1)  Date of D/C: as above 2)  Medication reconciliation:  done today at end visit 3)  Review of D/C summary or other information:  done today 4)  Review of need for f/u on pending diagnostic tests and treatments:   done today 5)  Review of need for Interaction with other providers who will assume or resume care of pt specific problems: done today 6)  Education of patient/family/guardian or caregiver: done today  Wt Readings from Last 3 Encounters:  03/17/23 224 lb (101.6 kg)  01/28/23 222 lb (100.7 kg)  03/22/22 220 lb (99.8 kg)   BP Readings from Last 3 Encounters:  03/17/23 120/70  01/28/23 120/78  03/22/22 130/82         Past Medical History:  Diagnosis Date   ALLERGIC RHINITIS 06/29/2007   Qualifier: Diagnosis of  By: Jonny Ruiz MD, Len Blalock    Allergy    seasonal   HYPERLIPIDEMIA 06/29/2007   Qualifier: Diagnosis of  By: Jonny Ruiz MD, Len Blalock    Right inguinal hernia 06/24/2011   Past Surgical History:  Procedure Laterality Date   INGUINAL HERNIA REPAIR     sinus surgury  1992   dr Haroldine Laws    reports that he has never smoked. He has never used smokeless tobacco. He reports that he does not drink alcohol and does not use drugs. family history includes Hypertension in an other family member; Parkinsonism in an other family member; Prostate cancer in an other family member. Allergies  Allergen Reactions   Quinolones Other (See Comments)    Fluroquinolone antibiotics should be avoided in patients with aortic disease unless alternative therapy is not an option.   Acetaminophen     REACTION: eyes swell Per family, patient takes Goody's powders   Aspirin  REACTION: eyes swell   Ibuprofen     REACTION: eyes swell   Penicillins     REACTION: pt cant remember   Current Outpatient Medications on File Prior to Visit  Medication Sig Dispense Refill   losartan (COZAAR) 25 MG tablet Take 25 mg by mouth daily.     metoprolol succinate (TOPROL-XL) 50 MG 24 hr tablet Take by mouth.     polyethylene glycol (MIRALAX / GLYCOLAX) 17 g packet Take by mouth.     senna-docusate (SENOKOT-S) 8.6-50 MG tablet Take by mouth.     urea (URE-NA) 15 g PACK oral packet Take 15 g by mouth daily.     apixaban  (ELIQUIS) 5 MG TABS tablet Take 1 tablet (5 mg total) by mouth 2 (two) times daily. (Patient not taking: Reported on 03/17/2023) 60 tablet 2   atorvastatin (LIPITOR) 20 MG tablet Take 1 tablet (20 mg total) by mouth daily. (Patient not taking: Reported on 03/17/2023) 30 tablet 1   Cholecalciferol (QC VITAMIN D3) 50 MCG (2000 UT) TABS Take 2,000 Units by mouth daily. (Patient not taking: Reported on 01/28/2023)     COD LIVER OIL PO Take by mouth. (Patient not taking: Reported on 03/17/2023)     guaiFENesin-dextromethorphan (ROBITUSSIN DM) 100-10 MG/5ML syrup Take 5 mLs by mouth every 4 (four) hours as needed for cough. (Patient not taking: Reported on 03/17/2023) 118 mL 0   POTASSIUM PO Take 250 mg by mouth daily. (Patient not taking: Reported on 03/17/2023)     predniSONE (DELTASONE) 20 MG tablet Take 2 tablets (40 mg total) by mouth daily with breakfast. (Patient not taking: Reported on 03/17/2023) 10 tablet 0   No current facility-administered medications on file prior to visit.        ROS:  All others reviewed and negative.  Objective        PE:  BP 120/70 (BP Location: Right Arm, Patient Position: Sitting, Cuff Size: Normal)   Pulse 80   Temp 99.1 F (37.3 C) (Oral)   Ht 6\' 2"  (1.88 m)   Wt 224 lb (101.6 kg)   SpO2 96%   BMI 28.76 kg/m                 Constitutional: Pt appears in NAD               HENT: Head: NCAT.                Right Ear: External ear normal.                 Left Ear: External ear normal.                Eyes: . Pupils are equal, round, and reactive to light. Conjunctivae and EOM are normal               Nose: without d/c or deformity               Neck: Neck supple. Gross normal ROM               Cardiovascular: Normal rate and regular rhythm.                 Pulmonary/Chest: Effort normal and breath sounds without rales or wheezing.                Abd:  Soft, NT, ND, + BS, no organomegaly  Neurological: Pt is alert. At baseline orientation, motor  grossly intact               Skin: Skin is warm. No rashes, no other new lesions, LE edema - none               Psychiatric: Pt behavior is normal without agitation   Micro: none  Cardiac tracings I have personally interpreted today:  none  Pertinent Radiological findings (summarize): none   Lab Results  Component Value Date   WBC 8.3 03/17/2023   HGB 9.8 (L) 03/17/2023   HCT 29.0 (L) 03/17/2023   PLT 601.0 (H) 03/17/2023   GLUCOSE 98 03/17/2023   CHOL 173 01/28/2023   TRIG 220.0 (H) 01/28/2023   HDL 29.60 (L) 01/28/2023   LDLCALC 99 01/28/2023   ALT 41 03/17/2023   AST 23 03/17/2023   NA 128 (L) 03/17/2023   K 5.4 No hemolysis seen (H) 03/17/2023   CL 95 (L) 03/17/2023   CREATININE 1.16 03/17/2023   BUN 28 (H) 03/17/2023   CO2 28 03/17/2023   TSH 1.00 01/28/2023   PSA 16.16 (H) 03/17/2023   INR 1.3 (H) 09/22/2020   HGBA1C 6.4 01/28/2023   Assessment/Plan:  Alexander Pierce is a 73 y.o. White or Caucasian [1] male with  has a past medical history of ALLERGIC RHINITIS (06/29/2007), Allergy, HYPERLIPIDEMIA (06/29/2007), and Right inguinal hernia (06/24/2011).  CKD stage 3a, GFR 45-59 ml/min (HCC) Lab Results  Component Value Date   CREATININE 1.16 03/17/2023   Stable overall, cont to avoid nephrotoxins, refer renal - [facility] per pt   Elevated PSA Lab Results  Component Value Date   PSA 16.16 (H) 03/17/2023   PSA 9.77 (H) 01/28/2023   PSA 5.16 (H) 04/24/2020   Markedly elevated, high risk for prost ca - pt for urology referral - [facility] per pt  Hyponatremia For f/u lab today, asympt it seems today  Postoperative anemia Lab Results  Component Value Date   WBC 8.3 03/17/2023   HGB 9.8 (L) 03/17/2023   HCT 29.0 (L) 03/17/2023   MCV 89.5 03/17/2023   PLT 601.0 (H) 03/17/2023   Post op anemia noted (ABL anemia), recent iron normal, for MVI with iron, f/u lab next visit  Followup: Return in about 6 months (around 09/14/2023).  [name], MD 03/20/2023 2:38 PM [facility] [place] [facility]

## 2023-03-17 NOTE — Patient Instructions (Signed)
 Please continue all other medications as before, and refills have been done if requested.  Please have the pharmacy call with any other refills you may need.  Please continue your efforts at being more active, low cholesterol diet, and weight control.  Please keep your appointments with your specialists as you may have planned  You will be contacted regarding the referral for: urology and renal at [facility]  Please go to the LAB at the blood drawing area for the tests to be done  You will be contacted by phone if any changes need to be made immediately.  Otherwise, you will receive a letter about your results with an explanation, but please check with MyChart first.  Please make an Appointment to return in 6 months, or sooner if needed, also with Lab Appointment for testing done 3-5 days before at the FIRST FLOOR Lab (so this is for TWO appointments - please see the scheduling desk as you leave)

## 2023-03-20 ENCOUNTER — Encounter: Payer: Self-pay | Admitting: Internal Medicine

## 2023-03-20 NOTE — Assessment & Plan Note (Addendum)
 Lab Results  Component Value Date   CREATININE 1.16 03/17/2023   Stable overall, cont to avoid nephrotoxins, refer renal - [facility] per pt

## 2023-03-20 NOTE — Assessment & Plan Note (Signed)
 For f/u lab today, asympt it seems today

## 2023-03-20 NOTE — Assessment & Plan Note (Signed)
 Lab Results  Component Value Date   WBC 8.3 03/17/2023   HGB 9.8 (L) 03/17/2023   HCT 29.0 (L) 03/17/2023   MCV 89.5 03/17/2023   PLT 601.0 (H) 03/17/2023   Post op anemia noted (ABL anemia), recent iron normal, for MVI with iron, f/u lab next visit

## 2023-03-20 NOTE — Assessment & Plan Note (Addendum)
 Lab Results  Component Value Date   PSA 16.16 (H) 03/17/2023   PSA 9.77 (H) 01/28/2023   PSA 5.16 (H) 04/24/2020   Markedly elevated, high risk for prost ca - pt for urology referral - [facility] per pt

## 2023-03-28 ENCOUNTER — Telehealth (HOSPITAL_COMMUNITY): Payer: Self-pay | Admitting: *Deleted

## 2023-03-28 NOTE — Telephone Encounter (Signed)
 Received referral notification from [facility] of this pt eligibility to participate in Cardiac rehab s/p CABG x 1 and AV replacement on 2/10.  Pt completed his post op follow up appt on 3/5.  Indicated he would like to participate at [facility].  Called and verified with pt this is correct.  Pt has office in [place] and he is scheduled for orientation on tomorrow. Thanked me for checking in with him. Alanson Aly, BSN Cardiac and Emergency planning/management officer

## 2023-03-31 ENCOUNTER — Telehealth: Payer: Self-pay

## 2023-03-31 DIAGNOSIS — E871 Hypo-osmolality and hyponatremia: Secondary | ICD-10-CM

## 2023-03-31 DIAGNOSIS — L989 Disorder of the skin and subcutaneous tissue, unspecified: Secondary | ICD-10-CM

## 2023-03-31 NOTE — Telephone Encounter (Signed)
 Copied from CRM [id number]. Topic: Referral - Request for Referral >> Mar 31, 2023  2:44 PM Shelbie Proctor wrote: Patient [phone] states 02/28/23 had open heart surgery and was losing sodium, they're trying to figure out the issues and recommended to see a nephrologist. Patient needs to see a kidney specialist, needs a referral. Also, need a referral for dermatologist for a yearly skin check, no history of skin cancer. Please advise and call back.

## 2023-04-01 NOTE — Telephone Encounter (Signed)
 Ok referrals are done

## 2023-04-05 NOTE — Telephone Encounter (Signed)
 Patient referral has not been sent over to [place] [facility] he has called them and was told it is not in the system he would like a call back regarding this

## 2023-04-07 ENCOUNTER — Telehealth: Payer: Self-pay | Admitting: Internal Medicine

## 2023-04-07 DIAGNOSIS — E871 Hypo-osmolality and hyponatremia: Secondary | ICD-10-CM

## 2023-04-07 NOTE — Telephone Encounter (Signed)
 Copied from CRM [id number]. Topic: Clinical - Request for Lab/Test Order >> Apr 07, 2023 12:55 PM Alexander Pierce O wrote: Reason for CRM: patient is calling cause the cardiologist want patient to come in and get another blood sample a current blood sample. Alexander Pierce is the cardiologist doctor  Please reach out to patient onse lab order has been put in  [id number]

## 2023-04-07 NOTE — Telephone Encounter (Signed)
**Note De-identified  [place] Obfuscation** Please advise 

## 2023-04-08 NOTE — Telephone Encounter (Signed)
 I am unable to do this as the name of the test is not given  Also there are multiple labs ordered already so perhaps the one the cardiologist wants is included?

## 2023-04-11 NOTE — Telephone Encounter (Signed)
 Copied from CRM [id number]. Topic: General - Other >> Apr 11, 2023  2:53 PM Fredrich Romans wrote: Reason for CRM: Patient called in stating that his cardiologist just needs a regular blood sample to look at his sodium levels.

## 2023-04-12 NOTE — Telephone Encounter (Signed)
 Ok sure this is ordered  thanks

## 2023-04-25 ENCOUNTER — Encounter

## 2023-04-25 NOTE — Progress Notes (Signed)
 This encounter was created in error - please disregard.  I called pt for visit and started the visit with him, pt then said that he had a meeting that was starting and that he would have to call me back.  He hung up on me as I did not get a chance to explain to pt what we could do about the visit today.

## 2023-04-29 ENCOUNTER — Other Ambulatory Visit (HOSPITAL_BASED_OUTPATIENT_CLINIC_OR_DEPARTMENT_OTHER): Payer: Self-pay

## 2023-05-01 ENCOUNTER — Emergency Department (HOSPITAL_COMMUNITY)
Admission: EM | Admit: 2023-05-01 | Discharge: 2023-05-01 | Disposition: A | Discharge status: Home / Self Care | Attending: Emergency Medicine | Admitting: Emergency Medicine

## 2023-05-01 ENCOUNTER — Ambulatory Visit (HOSPITAL_COMMUNITY): Admission: EM | Admit: 2023-05-01 | Discharge: 2023-05-01 | Disposition: A | Discharge status: Home / Self Care

## 2023-05-01 ENCOUNTER — Emergency Department (HOSPITAL_COMMUNITY)

## 2023-05-01 ENCOUNTER — Other Ambulatory Visit: Payer: Self-pay

## 2023-05-01 ENCOUNTER — Encounter (HOSPITAL_COMMUNITY): Payer: Self-pay | Admitting: Emergency Medicine

## 2023-05-01 ENCOUNTER — Encounter (HOSPITAL_COMMUNITY): Payer: Self-pay

## 2023-05-01 DIAGNOSIS — Z8679 Personal history of other diseases of the circulatory system: Secondary | ICD-10-CM

## 2023-05-01 DIAGNOSIS — Z955 Presence of coronary angioplasty implant and graft: Secondary | ICD-10-CM | POA: Insufficient documentation

## 2023-05-01 DIAGNOSIS — N183 Chronic kidney disease, stage 3 unspecified: Secondary | ICD-10-CM | POA: Insufficient documentation

## 2023-05-01 DIAGNOSIS — I129 Hypertensive chronic kidney disease with stage 1 through stage 4 chronic kidney disease, or unspecified chronic kidney disease: Secondary | ICD-10-CM | POA: Diagnosis not present

## 2023-05-01 DIAGNOSIS — R079 Chest pain, unspecified: Secondary | ICD-10-CM

## 2023-05-01 DIAGNOSIS — Q2381 Bicuspid aortic valve: Secondary | ICD-10-CM

## 2023-05-01 DIAGNOSIS — Z7901 Long term (current) use of anticoagulants: Secondary | ICD-10-CM | POA: Insufficient documentation

## 2023-05-01 DIAGNOSIS — Z79899 Other long term (current) drug therapy: Secondary | ICD-10-CM | POA: Insufficient documentation

## 2023-05-01 DIAGNOSIS — I7121 Aneurysm of the ascending aorta, without rupture: Secondary | ICD-10-CM | POA: Diagnosis present

## 2023-05-01 DIAGNOSIS — I2510 Atherosclerotic heart disease of native coronary artery without angina pectoris: Secondary | ICD-10-CM | POA: Diagnosis not present

## 2023-05-01 DIAGNOSIS — N401 Enlarged prostate with lower urinary tract symptoms: Secondary | ICD-10-CM | POA: Diagnosis present

## 2023-05-01 DIAGNOSIS — Z952 Presence of prosthetic heart valve: Secondary | ICD-10-CM

## 2023-05-01 DIAGNOSIS — N1831 Chronic kidney disease, stage 3a: Secondary | ICD-10-CM | POA: Diagnosis present

## 2023-05-01 DIAGNOSIS — I719 Aortic aneurysm of unspecified site, without rupture: Secondary | ICD-10-CM | POA: Diagnosis not present

## 2023-05-01 DIAGNOSIS — E785 Hyperlipidemia, unspecified: Secondary | ICD-10-CM | POA: Diagnosis present

## 2023-05-01 DIAGNOSIS — Z951 Presence of aortocoronary bypass graft: Secondary | ICD-10-CM

## 2023-05-01 DIAGNOSIS — I10 Essential (primary) hypertension: Secondary | ICD-10-CM | POA: Diagnosis not present

## 2023-05-01 LAB — CBC
HCT: 40.8 % (ref 39.0–52.0)
Hemoglobin: 13.4 g/dL (ref 13.0–17.0)
MCH: 28.5 pg (ref 26.0–34.0)
MCHC: 32.8 g/dL (ref 30.0–36.0)
MCV: 86.6 fL (ref 80.0–100.0)
Platelets: 270 10*3/uL (ref 150–400)
RBC: 4.71 MIL/uL (ref 4.22–5.81)
RDW: 14.7 % (ref 11.5–15.5)
WBC: 7.6 10*3/uL (ref 4.0–10.5)
nRBC: 0 % (ref 0.0–0.2)

## 2023-05-01 LAB — BASIC METABOLIC PANEL WITH GFR
Anion gap: 10 (ref 5–15)
BUN: 16 mg/dL (ref 8–23)
CO2: 24 mmol/L (ref 22–32)
Calcium: 8.9 mg/dL (ref 8.9–10.3)
Chloride: 94 mmol/L — ABNORMAL LOW (ref 98–111)
Creatinine, Ser: 1.09 mg/dL (ref 0.61–1.24)
GFR, Estimated: >60 mL/min (ref >60)
Glucose, Bld: 90 mg/dL (ref 70–99)
Potassium: 4.6 mmol/L (ref 3.5–5.1)
Sodium: 128 mmol/L — ABNORMAL LOW (ref 135–145)

## 2023-05-01 LAB — MAGNESIUM: Magnesium: 2.1 mg/dL (ref 1.7–2.4)

## 2023-05-01 LAB — TROPONIN I (HIGH SENSITIVITY): Troponin I (High Sensitivity): 15 ng/L (ref <18)

## 2023-05-01 MED ORDER — ALUM & MAG HYDROXIDE-SIMETH 200-200-20 MG/5ML PO SUSP
30.0000 mL | Freq: Once | ORAL | Status: AC
  Administered 2023-05-01: 30 mL via ORAL
  Filled 2023-05-01: qty 30

## 2023-05-01 NOTE — ED Notes (Signed)
 Patient is being discharged from the Urgent Care and sent to the Emergency Department via POV. Per Cristal Don, NP, patient is in need of higher level of care due to chest pain and recent open heart surgery 02/28/23. Patient is aware and verbalizes understanding of plan of care.  Vitals:   05/01/23 1053  BP: (!) 168/90  Pulse: 69  Resp: 18  Temp: 97.7 F (36.5 C)  SpO2: 97%

## 2023-05-01 NOTE — Discharge Instructions (Addendum)
 1. Chest pain, unspecified type (Primary) - EKG 12-Lead completed in UC to evaluate for chest pain shows sinus rhythm with first-degree AV block, possible anterior infarct noted on EKG, abnormal EKG, ventricular rate of 73 bpm.  No STEMI. -Due to patient's past history of open heart surgery and recent onset of chest pain recommend follow-up in emergency department for further evaluation and management. -As discussed due to complicated cardiac history and onset of chest pain care requirements are beyond the scope of practice of the urgent care setting and require more emergency medical attention. -Please go directly to [facility] emergency department across the street for immediate cardiac management and evaluation.

## 2023-05-01 NOTE — ED Provider Notes (Cosign Needed Addendum)
 [place] EMERGENCY DEPARTMENT AT [facility] Provider Note   CSN: [id number] Arrival date & time: 05/01/23  1112     History  Chief Complaint  Patient presents with   Chest Pain    Alexander Pierce is a 73 y.o. male. With pmhx of CAD, Hx of CABG, S/P AVR, H/O aortic aneurysm repair, HTN, HLD, CKD stage 3 presenting to emergency room with complaint of chest pain.  Patient reports that yesterday evening he had some left-sided chest discomfort that was nonradiating.  He reports that it went away once he drank some water and Pedialyte.  He reports that today when he was getting up to go to church he sat down in his car and noticed he had some chest discomfort again in the left upper part of chest.  It was associated with feeling diaphoretic and unwell. Again, drank water and symptoms improved on there own. Denies any medications tried. Rate chest feeling as very mid and not painful anymore. He denies any palpitations, presyncope syncope shortness of breath abdominal pain nausea or vomiting. Has been going to cardiac rehab 3 times a week over the past 2 months reports he has not had any exertional chest pain or exertional shortness of breath during this time.   Chest Pain      Home Medications Prior to Admission medications   Medication Sig Start Date End Date Taking? Authorizing Provider  apixaban (ELIQUIS) 5 MG TABS tablet Take 1 tablet (5 mg total) by mouth 2 (two) times daily. Patient not taking: Reported on 03/17/2023 09/27/20   Ilda Malkin, PA-C  atorvastatin (LIPITOR) 20 MG tablet Take 1 tablet (20 mg total) by mouth daily. Patient not taking: Reported on 03/17/2023 09/28/20   Ilda Malkin, PA-C  Cholecalciferol (QC VITAMIN D3) 50 MCG (2000 UT) TABS Take 2,000 Units by mouth daily. Patient not taking: Reported on 01/28/2023    [provider]  COD LIVER OIL PO Take by mouth. Patient not taking: Reported on 03/17/2023    [provider]   guaiFENesin-dextromethorphan (ROBITUSSIN DM) 100-10 MG/5ML syrup Take 5 mLs by mouth every 4 (four) hours as needed for cough. Patient not taking: Reported on 03/17/2023 03/22/22   Roslyn Coombe, MD  losartan (COZAAR) 25 MG tablet Take 25 mg by mouth daily.    [provider]  metoprolol succinate (TOPROL-XL) 50 MG 24 hr tablet Take by mouth. 07/16/21   [provider]  polyethylene glycol (MIRALAX / GLYCOLAX) 17 g packet Take by mouth. 03/10/23   [provider]  POTASSIUM PO Take 250 mg by mouth daily. Patient not taking: Reported on 03/17/2023    [provider]  predniSONE (DELTASONE) 20 MG tablet Take 2 tablets (40 mg total) by mouth daily with breakfast. Patient not taking: Reported on 03/17/2023 07/02/21   Isidro Margo, DO  senna-docusate (SENOKOT-S) 8.6-50 MG tablet Take by mouth. 03/10/23   [provider]      Allergies    Quinolones, Acetaminophen, Aspirin, Ibuprofen, and Penicillins    Review of Systems   Review of Systems  Cardiovascular:  Positive for chest pain.    Physical Exam Updated Vital Signs BP (!) 172/95 (BP Location: Right Arm)   Pulse 70   Temp 97.9 F (36.6 C) (Oral)   Resp 16   Ht 6\' 2"  (1.88 m)   Wt 97.5 kg   SpO2 97%   BMI 27.60 kg/m  Physical Exam Vitals and nursing note reviewed.  Constitutional:  General: He is not in acute distress.    Appearance: He is not toxic-appearing.  HENT:     Head: Normocephalic and atraumatic.  Eyes:     General: No scleral icterus.    Conjunctiva/sclera: Conjunctivae normal.  Cardiovascular:     Rate and Rhythm: Normal rate and regular rhythm.     Pulses: Normal pulses.     Heart sounds: Normal heart sounds.  Pulmonary:     Effort: Pulmonary effort is normal. No respiratory distress.     Breath sounds: Normal breath sounds.  Abdominal:     General: Abdomen is flat. Bowel sounds are normal.     Palpations: Abdomen is soft.     Tenderness: There is no abdominal  tenderness.  Musculoskeletal:     Right lower leg: No edema.     Left lower leg: No edema.  Skin:    General: Skin is warm and dry.     Findings: No lesion.  Neurological:     General: No focal deficit present.     Mental Status: He is alert and oriented to person, place, and time. Mental status is at baseline.     ED Results / Procedures / Treatments   Labs (all labs ordered are listed, but only abnormal results are displayed) Labs Reviewed  BASIC METABOLIC PANEL WITH GFR - Abnormal; Notable for the following components:      Result Value   Sodium 128 (*)    Chloride 94 (*)    All other components within normal limits  CBC  MAGNESIUM  TROPONIN I (HIGH SENSITIVITY)  TROPONIN I (HIGH SENSITIVITY)    EKG EKG Interpretation Date/Time:  Sunday May 01 2023 11:19:12 EDT Ventricular Rate:  73 PR Interval:  220 QRS Duration:  76 QT Interval:  394 QTC Calculation: 434 R Axis:   68  Text Interpretation: Sinus rhythm with 1st degree A-V block Possible Anterior infarct , age undetermined Abnormal ECG When compared with ECG of 01-May-2023 10:56, PREVIOUS ECG IS PRESENT Confirmed by Auston Blush [phone]) on 05/01/2023 12:57:16 PM  Radiology DG Chest 2 View Result Date: 05/01/2023 CLINICAL DATA:  chest pain EXAM: CHEST - 2 VIEW COMPARISON:  03/22/2022 FINDINGS: Lungs are clear. No pneumothorax. Mild elevation of the right diaphragmatic leaflet. Heart size and mediastinal contours are within normal limits. Post AVR. Blunting of posterior left costophrenic angle. Sternotomy wires. IMPRESSION: 1. No acute cardiopulmonary disease. 2. Blunting of posterior left costophrenic angle. Electronically Signed   By: Nicoletta Barrier M.D.   On: 05/01/2023 11:49    Procedures Procedures    Medications Ordered in ED Medications - No data to display  ED Course/ Medical Decision Making/ A&P Clinical Course as of 05/01/23 1336  Sun May 01, 2023  1305 Spoke with cardiology who agree to see patient.   [JB]  1335 Cards spoke with patient - feels CP is atypical, feel he is low risk and has good follow up, okay to discharge.  [JB]    Clinical Course User Index [JB] Jorell Agne, Kandace Organ, PA-C                                 Medical Decision Making Amount and/or Complexity of Data Reviewed Labs: ordered. Radiology: ordered.  Risk OTC drugs.   This patient presents to the ED for concern of CP, this involves an extensive number of treatment options, and is a complaint that carries with it a high  risk of complications and morbidity.  The differential diagnosis includes ACS, aortic dissection, PE, gastritis, GERD, pneumothorax, pneumonia, viral URI   Co morbidities that complicate the patient evaluation  CAD, Hx of CABG, S/P AVR, H/O aortic aneurysm repair, HTN, HLD, CKD stage 3   Additional history obtained:  Additional history 04/07/22 OV with cardiology    Lab Tests:  I personally interpreted labs.  The pertinent results include:   CBC without leukocytosis and no anemia Initial troponin 15 Mag WNL BMP remarkable for sodium of 128 patient has history of hyponatremia and follows with nephrology for this   Imaging Studies ordered:  I ordered imaging studies including chest x-ray  I independently visualized and interpreted imaging which showed no acute disease I agree with the radiologist interpretation   Cardiac Monitoring: / EKG:  The patient was maintained on a cardiac monitor.  I personally viewed and interpreted the cardiac monitored which showed an underlying rhythm of: 1st degree AV block    Consultations Obtained:  I requested consultation with the cards,  and discussed lab and imaging findings as well as pertinent plan - they recommend: admit and they will consult.    Problem List / ED Course / Critical interventions / Medication management  Reporting with chest pain that is left sided and associated with diaphoresis and nausea. He has had some intermittent  chest pain since his procedure.  Over the last 2 days he has had 2 episodes of chest pain.  Today this started while at rest and started off as tight chest pain and has had some mild improvement of symptoms with rest and drinking water.  He is hemodynamically stable and he is well-appearing.  He does have mild EKG changes. Ha did have recent CABG.  Symptoms are not consistent with aortic dissection and he does have radial pulses equal bilaterally.  Troponin is 15 thus doubt ACS.  Chest x-ray without pneumonia or pneumothorax. No history of PE and no sign of DVT on exam.  Dr Arlester Ladd with cardiology saw patient.  Feels patient is low risk with atypical story less consistent with coronary artery disease.  Patient is feeling better after Maalox. Feels patient is okay to go home.  Patient has appropriate follow-up with cardiology tomorrow.  Stable throughout stay.  Will discharge and give strict return precautions. I ordered medication including Maalox  Reevaluation of the patient after these medicines showed that the patient improved I have reviewed the patients home medicines and have made adjustments as needed   Plan  Stable for discharge. Given return precautions.          Final Clinical Impression(s) / ED Diagnoses Final diagnoses:  Chest pain, unspecified type    Rx / DC Orders ED Discharge Orders     None         Eudora Heron, PA-C 05/01/23 1343    Yara Tomkinson, Kandace Organ, PA-C 05/01/23 1353    Auston Blush, MD 05/05/23 [phone]

## 2023-05-01 NOTE — ED Triage Notes (Signed)
 Pt reports last night before midnight was woken up to left sided chest pain. Reports it finally eased off. When getting ready for church felt "something weird going on in my heart and I just had open heart surgery 2/10". In the past week has happened 4-5 times. Mentioned to nurse at cardiology on Friday and was told he was still healing. Had valve and aorta replace. Reports in hospital sodium kept dropping.  Reports pressure and pain in heart.

## 2023-05-01 NOTE — Consult Note (Signed)
 Cardiology CONSULT NOTE    Patient ID: Alexander Pierce MRN: [id number], DOB/AGE: Jan 01, 1951 73 y.o.  Admit date: 05/01/2023 Date of Consult: 05/01/2023  Primary Physician: Roslyn Coombe, MD Primary Cardiologist:  Electrophysiologist:   Patient Profile: Alexander Pierce is a 73 y.o. male with a history of bicuspid aortic valve, recent Bentall procedure and LIMA graft to LAD, Stroke who is being seen today for the evaluation of chest pain at the request of PA Barrett.  HPI:  Alexander Pierce is a 73 y.o. male with a history of aortic valve replacement and coronary artery bypass grafting, presents with chest tightness.  Chest tightness and discomfort began last night just before midnight, described as a 'little disc' with tightness in the chest. The symptoms improved after drinking water and Pedialyte and lying down with legs elevated, allowing them to return to bed. This morning, similar chest tightness occurred while sitting in the car, prompting a visit to urgent care where blood pressure was noted to be 168. Previously, blood pressure was 126/78 after cardiac rehab on Friday. Episodes of chest tightness have been intermittent over the past week, lasting 10 to 15 minutes, with deep breathing providing relief.  A history of aortic valve replacement and coronary artery bypass grafting was performed on February 28, 2023, due to a bicuspid aortic valve and an aortic aneurysm. A heart catheterization on February 15, 2023, revealed high-grade LAD stenosis and mild aortic stenosis. Post-surgery, sodium loss was experienced but has since normalized.  Atrial fibrillation was noted post-surgery and managed with medically and resolved prior to discharge.  Currently on metoprolol 25 mg extended release and participating in cardiac rehab for about three weeks without discomfort during sessions.  No shortness of breath, inability to lie flat, lightheadedness, dizziness, or palpitations. Deep breathing and resting  help relieve chest tightness.  He denies palpitations, dyspnea, PND, orthopnea, nausea, vomiting, dizziness, syncope, edema, or weight gain.  Past Medical History:  Diagnosis Date   ALLERGIC RHINITIS 06/29/2007   Qualifier: Diagnosis of  By: Autry Legions MD, Alveda Aures    Allergy    seasonal   HYPERLIPIDEMIA 06/29/2007   Qualifier: Diagnosis of  By: Autry Legions MD, Alveda Aures    Right inguinal hernia 06/24/2011      Home medications (Not in a hospital admission)     Physical Exam: Vitals:   05/01/23 1121 05/01/23 1124  BP: (!) 172/95   Pulse: 70   Resp: 16   Temp: 97.9 F (36.6 C)   TempSrc: Oral   SpO2: 97%   Weight:  97.5 kg  Height:  6\' 2"  (1.88 m)    Gen: Appears comfortable, well-nourished CV: RRR, no dependent edema Pulm: breathing easily, CTAB MSK: No tenderness to chest palpation  PERTINENT STUDIES SUMMARIZED:  Echocardiogram:      09/2020 --EF 45 to 50%.  Bicuspid aortic valve.      09/10/2022 -- ([facility]) normal LVEF functional bicuspid valve.  Dilated ascending aorta 4 to 5.4 cm mild aortic regurgitation   Heart Cath: [facility] coronary angiogram reviewed in epic.  Significant LAD disease noted moderate nonocclusive disease noted of the remaining vessels.  Imaging:   CXR reviewed in Epic   EKG: Sinus rhythm with first-degree AV block.  (personally reviewed)  TELEMETRY:    Sinus rhythm (personally reviewed)  ASSESSMENT & PLAN:  Bicuspid Aortic Valve with Aortic Aneurysm and Coronary Artery Disease Post-surgical recovery from valve replacement, aortic root replacement, and coronary artery bypass grafting is progressing well. Surgery was  performed to prevent aneurysm rupture.  Chest Pain Intermittent chest tightness likely due to post-procedural inflammation, not coronary issue. No graft failure or acute coronary syndrome indicated. - Continue cardiac rehabilitation. - Monitor for abnormal heart rhythms during rehab. - Educate on signs of abnormal heart rhythms. - Ensure  blood pressure control.  Atrial Fibrillation (Post-Surgical) - No definitive evidence of recurrence - He did not mention a history of AF, but it ECGs from 2022 show AF - continue eliquis  Hypertension Elevated blood pressure noted, requires monitoring and management. - Per pt, Bps have been controlled at cardiac rehab - Adjust antihypertensive therapy if needed.  Hyponatremia (Post-Surgical) Resolved with normal sodium levels.  Follow-up Continued follow-up with cardiac rehabilitation and monitoring. No hospital admission needed. - Attend cardiac rehabilitation sessions. - Follow up with cardiologist and surgeon as needed.  For questions or updates, please contact [facility] Please consult [email or web address] for contact info under Cardiology/STEMI.  Signed, Marlane Silver, MD 05/01/2023 1:34 PM

## 2023-05-01 NOTE — Discharge Instructions (Signed)
 You were seen in emergency room today.  Your EKG chest x-ray and labs are overall reassuring.  You spoke with our cardiologist team who feels it is appropriate for you to go to cardiac rehab tomorrow and call your cardiologist for further care.  Please return to the ER if you do have reproduced chest pain or shortness of breath.

## 2023-05-01 NOTE — ED Triage Notes (Addendum)
 Patient reports left sided chest pain last night and then again this morning and while walking into church.  Reports he go sweaty and felt something weird was going on with her heart.  Reports it has happened  several times over the past week.  Denies pain radiating anywhere. Open heart in feb.

## 2023-05-01 NOTE — ED Provider Notes (Signed)
 [facility]  Note:  This document was prepared using Dragon voice recognition software and may include unintentional dictation errors.  MRN: [id number] DOB: 30-Aug-1950  Subjective:   Alexander Pierce is a 73 y.o. male presenting for chest pain that woke him up last night around midnight.  He states that chest pain did eventually ease off but while he was getting ready for work his heart felt "weird" patient had recent open heart surgery on February 28, 2023.  Patient states his surgery was to replace aortic valve.  Patient states that he currently feels pressure in his chest.  Over the last week he has had 4-5 episodes of this pressure in his chest.  Patient was advised by cardiology nurse on Friday at that his heart is still healing.  They made no recommendation of follow-up in ER for cardiac evaluation.  Denies shortness of breath, weakness, dizziness.  No current facility-administered medications for this encounter.  Current Outpatient Medications:    apixaban (ELIQUIS) 5 MG TABS tablet, Take 1 tablet (5 mg total) by mouth 2 (two) times daily. (Patient not taking: Reported on 03/17/2023), Disp: 60 tablet, Rfl: 2   atorvastatin (LIPITOR) 20 MG tablet, Take 1 tablet (20 mg total) by mouth daily. (Patient not taking: Reported on 03/17/2023), Disp: 30 tablet, Rfl: 1   Cholecalciferol (QC VITAMIN D3) 50 MCG (2000 UT) TABS, Take 2,000 Units by mouth daily. (Patient not taking: Reported on 01/28/2023), Disp: , Rfl:    COD LIVER OIL PO, Take by mouth. (Patient not taking: Reported on 03/17/2023), Disp: , Rfl:    guaiFENesin-dextromethorphan (ROBITUSSIN DM) 100-10 MG/5ML syrup, Take 5 mLs by mouth every 4 (four) hours as needed for cough. (Patient not taking: Reported on 03/17/2023), Disp: 118 mL, Rfl: 0   losartan (COZAAR) 25 MG tablet, Take 25 mg by mouth daily., Disp: , Rfl:    metoprolol succinate (TOPROL-XL) 50 MG 24 hr tablet, Take by mouth., Disp: , Rfl:    polyethylene glycol (MIRALAX /  GLYCOLAX) 17 g packet, Take by mouth., Disp: , Rfl:    POTASSIUM PO, Take 250 mg by mouth daily. (Patient not taking: Reported on 03/17/2023), Disp: , Rfl:    predniSONE (DELTASONE) 20 MG tablet, Take 2 tablets (40 mg total) by mouth daily with breakfast. (Patient not taking: Reported on 03/17/2023), Disp: 10 tablet, Rfl: 0   senna-docusate (SENOKOT-S) 8.6-50 MG tablet, Take by mouth., Disp: , Rfl:    Allergies  Allergen Reactions   Quinolones Other (See Comments)    Fluroquinolone antibiotics should be avoided in patients with aortic disease unless alternative therapy is not an option.   Acetaminophen     REACTION: eyes swell Per family, patient takes Goody's powders   Aspirin     REACTION: eyes swell   Ibuprofen     REACTION: eyes swell   Penicillins     REACTION: pt cant remember    Past Medical History:  Diagnosis Date   ALLERGIC RHINITIS 06/29/2007   Qualifier: Diagnosis of  By: Autry Legions MD, Alveda Aures    Allergy    seasonal   HYPERLIPIDEMIA 06/29/2007   Qualifier: Diagnosis of  By: Autry Legions MD, Alveda Aures    Right inguinal hernia 06/24/2011     Past Surgical History:  Procedure Laterality Date   CARDIAC SURGERY     INGUINAL HERNIA REPAIR     sinus surgury  01/18/1990   dr Franklin Ito    Family History  Problem Relation Age of Onset   Parkinsonism  Other    Prostate cancer Other    Hypertension Other    Colon cancer Neg Hx    Rectal cancer Neg Hx    Stomach cancer Neg Hx     Social History   Tobacco Use   Smoking status: Never   Smokeless tobacco: Never  Substance Use Topics   Alcohol use: No   Drug use: No    ROS Refer to HPI for ROS details.  Objective:   Vitals: BP (!) 168/90 (BP Location: Right Arm)   Pulse 69   Temp 97.7 F (36.5 C) (Oral)   Resp 18   SpO2 97%   Physical Exam Vitals and nursing note reviewed.  Constitutional:      General: He is not in acute distress.    Appearance: He is well-developed. He is not ill-appearing or toxic-appearing.  HENT:      Head: Normocephalic.  Cardiovascular:     Rate and Rhythm: Normal rate and regular rhythm.  Pulmonary:     Effort: Pulmonary effort is normal. No respiratory distress.  Chest:     Chest wall: No tenderness.  Skin:    General: Skin is warm and dry.     Capillary Refill: Capillary refill takes less than 2 seconds.  Neurological:     General: No focal deficit present.     Mental Status: He is alert and oriented to person, place, and time.  Psychiatric:        Mood and Affect: Mood normal.        Behavior: Behavior normal.     Procedures  No results found for this or any previous visit (from the past 24 hours).  Assessment and Plan :   1. Chest pain, unspecified type (Primary) - EKG 12-Lead completed in UC to evaluate for chest pain shows sinus rhythm with first-degree AV block, possible anterior infarct noted on EKG, abnormal EKG, ventricular rate of 73 bpm.  No STEMI. -Due to patient's past history of open heart surgery and recent onset of chest pain recommend follow-up in emergency department for further evaluation and management. -As discussed due to complicated cardiac history and onset of chest pain care requirements are beyond the scope of practice of the urgent care setting and require more emergency medical attention. -Please go directly to [facility] emergency department across the street for immediate cardiac management and evaluation. -Continue to monitor symptoms for any change in severity if there is any escalation of current symptoms or development of new symptoms follow-up in ER for further evaluation and management.  Myleka Moncure B Hillman Attig   Armistead Sult, [place] B, [place] 05/01/23 1110

## 2023-06-29 ENCOUNTER — Ambulatory Visit (INDEPENDENT_AMBULATORY_CARE_PROVIDER_SITE_OTHER)

## 2023-06-29 ENCOUNTER — Encounter: Payer: Self-pay | Admitting: Emergency Medicine

## 2023-06-29 ENCOUNTER — Other Ambulatory Visit: Payer: Self-pay

## 2023-06-29 ENCOUNTER — Ambulatory Visit
Admission: EM | Admit: 2023-06-29 | Discharge: 2023-06-29 | Disposition: A | Discharge status: Home / Self Care | Attending: Family Medicine | Admitting: Family Medicine

## 2023-06-29 DIAGNOSIS — R051 Acute cough: Secondary | ICD-10-CM | POA: Diagnosis not present

## 2023-06-29 MED ORDER — PREDNISONE 20 MG PO TABS: 40.0000 mg | ORAL_TABLET | Freq: Every day | ORAL | 0 refills | Status: DC

## 2023-06-29 MED ORDER — BENZONATATE 100 MG PO CAPS: ORAL_CAPSULE | ORAL | 0 refills | Status: DC

## 2023-06-29 NOTE — ED Triage Notes (Signed)
 Pt st's he started having chest congestion 1 week ago with sore throat.  Pt st's sore throat has subsided but st's he still feels like he has some congestion

## 2023-06-29 NOTE — ED Provider Notes (Signed)
 [facility]   [id number] 06/29/23 Arrival Time: 1436  ASSESSMENT & PLAN:  1. Acute cough    I have personally viewed and independently interpreted the imaging studies ordered this visit. CXR: no acute changes.  Discussed typical duration of likely viral illness and post-viral cough. With wheezing. Begin: Meds ordered this encounter  Medications   benzonatate (TESSALON) 100 MG capsule    Sig: Take 1 capsule by mouth every 8 (eight) hours for cough.    Dispense:  21 capsule    Refill:  0   predniSONE  (DELTASONE ) 20 MG tablet    Sig: Take 2 tablets (40 mg total) by mouth daily.    Dispense:  10 tablet    Refill:  0     Follow-up Information     Roslyn Coombe, MD.   Specialties: Internal Medicine, Radiology Why: If worsening or failing to improve as anticipated. Contact information: [address] [place] [address] [phone]                 Reviewed expectations re: course of current medical issues. Questions answered. Outlined signs and symptoms indicating need for more acute intervention. Understanding verbalized. After Visit Summary given.   SUBJECTIVE: History from: Patient. Alexander Pierce is a 73 y.o. male. Pt st's he started having chest congestion 1 week ago with sore throat.  Pt st's sore throat has subsided but st's he still feels like he has some congestion. Denies SOB; ques wheezing at times.  Denies: fever. Normal PO intake without n/v/d.  OBJECTIVE:  Vitals:   06/29/23 1454  BP: 133/80  Pulse: 74  Resp: 18  Temp: 97.7 F (36.5 C)  TempSrc: Oral  SpO2: 94%    General appearance: alert; no distress Eyes: PERRLA; EOMI; conjunctiva normal HENT: [place]; AT; without nasal congestion Neck: supple  Lungs: speaks full sentences without difficulty; unlabored; mod bilateral wheezing Extremities: no edema Skin: warm and dry Neurologic: normal gait Psychological: alert and cooperative; normal mood and affect  Imaging: DG  Chest 2 View Result Date: 06/29/2023 CLINICAL DATA:  Chest congestion for 1 week.  Cough and wheezing. EXAM: CHEST - 2 VIEW COMPARISON:  May 01, 2023 FINDINGS: The heart size and mediastinal contours are within normal limits. Status post aortic valve repair. Both lungs are clear. The visualized skeletal structures are unremarkable. IMPRESSION: No active cardiopulmonary disease. Electronically Signed   By: Rosalene Colon M.D.   On: 06/29/2023 16:03    Allergies  Allergen Reactions   Quinolones Other (See Comments)    Fluroquinolone antibiotics should be avoided in patients with aortic disease unless alternative therapy is not an option.   Acetaminophen      REACTION: eyes swell Per family, patient takes Goody's powders   Aspirin      REACTION: eyes swell   Ibuprofen      REACTION: eyes swell   Penicillins     REACTION: pt cant remember    Past Medical History:  Diagnosis Date   ALLERGIC RHINITIS 06/29/2007   Qualifier: Diagnosis of  By: Autry Legions MD, Alveda Aures    Allergy    seasonal   HYPERLIPIDEMIA 06/29/2007   Qualifier: Diagnosis of  By: Autry Legions MD, Alveda Aures    Right inguinal hernia 06/24/2011   Social History   Socioeconomic History   Marital status: Married    Spouse name: Not on file   Number of children: Not on file   Years of education: Not on file   Highest education level: Not  on file  Occupational History   Not on file  Tobacco Use   Smoking status: Never   Smokeless tobacco: Never  Substance and Sexual Activity   Alcohol use: No   Drug use: No   Sexual activity: Not on file  Other Topics Concern   Not on file  Social History Narrative   Not on file   Social Drivers of Health   Financial Resource Strain: Low Risk  (02/27/2023)   Received from [facility]   Overall Financial Resource Strain (CARDIA)    Difficulty of Paying Living Expenses: Not hard at all  Food Insecurity: No Food Insecurity (02/27/2023)   Received from [facility]    Hunger Vital Sign    Worried About Running Out of Food in the Last Year: Never true    Ran Out of Food in the Last Year: Never true  Transportation Needs: No Transportation Needs (03/01/2023)   Received from [facility] - Transportation    In the past 12 months, has lack of transportation kept you from medical appointments or from getting medications?: No    Lack of Transportation (Non-Medical): No  Physical Activity: Not on file  Stress: No Stress Concern Present (02/15/2023)   Received from [facility] - Occupational Stress Questionnaire    Feeling of Stress : Not at all  Social Connections: Unknown (06/02/2021)   Received from [facility]   Social Network    Social Network: Not on file  Intimate Partner Violence: Not At Risk (02/15/2023)   Received from [facility]   HITS    Over the last 12 months how often did your partner physically hurt you?: Never    Over the last 12 months how often did your partner insult you or talk down to you?: Never    Over the last 12 months how often did your partner threaten you with physical harm?: Never    Over the last 12 months how often did your partner scream or curse at you?: Never   Family History  Problem Relation Age of Onset   Parkinsonism Other    Prostate cancer Other    Hypertension Other    Colon cancer Neg Hx    Rectal cancer Neg Hx    Stomach cancer Neg Hx    Past Surgical History:  Procedure Laterality Date   CARDIAC SURGERY     INGUINAL HERNIA REPAIR     sinus surgury  01/18/1990   dr Osvaldo Blender, MD 06/29/23 [phone]

## 2023-07-06 ENCOUNTER — Ambulatory Visit: Admitting: Internal Medicine

## 2023-07-12 LAB — COLOGUARD: Cologuard: NEGATIVE

## 2023-07-13 LAB — COLOGUARD: COLOGUARD: NEGATIVE

## 2023-07-19 ENCOUNTER — Ambulatory Visit
Admission: EM | Admit: 2023-07-19 | Discharge: 2023-07-19 | Disposition: A | Discharge status: Home / Self Care | Attending: Family Medicine | Admitting: Family Medicine

## 2023-07-19 DIAGNOSIS — R072 Precordial pain: Secondary | ICD-10-CM | POA: Diagnosis not present

## 2023-07-19 NOTE — ED Provider Notes (Signed)
 [facility]    CSN: [id number] Arrival date & time: 07/19/23  1548      History   Chief Complaint Chief Complaint  Patient presents with   Chest Pain    HPI Alexander Pierce is a 73 y.o. male.    Chest Pain Here for chest pain and tightness.  This afternoon shortly after having eaten much he noted a feeling of lightheadedness and then had some pain and tightness in his left chest.  It was not intense or severe.  Then he developed some diaphoresis.  He laid down and then felt better within about 10 minutes.  He did contact his cardiologist and they were going to try to see him tomorrow.  He decided come here for further evaluation.  His symptoms have resolved and he is not having any more dizziness or diaphoresis or chest pain or tightness.  He has had these spells several times for the last few months.  Heart monitoring has not revealed any arrhythmias so far.  He has not checked his blood pressure during this time  He does not have diabetes.  He has allergy to ibuprofen , aspirin , quinolones, and penicillins.   Past Medical History:  Diagnosis Date   ALLERGIC RHINITIS 06/29/2007   Qualifier: Diagnosis of  By: Norleen MD, Lynwood ORN    Allergy    seasonal   HYPERLIPIDEMIA 06/29/2007   Qualifier: Diagnosis of  By: Norleen MD, Lynwood ORN    Right inguinal hernia 06/24/2011    Patient Active Problem List   Diagnosis Date Noted   Postoperative anemia 03/17/2023   Elevated PSA 03/17/2023   CKD stage 3a, GFR 45-59 ml/min (HCC) 03/17/2023   Delirium 03/08/2023   Thrombocytopenia (HCC) 03/08/2023   Postoperative atrial fibrillation (HCC) 03/05/2023   Acute postoperative pain 02/28/2023   CAD (coronary artery disease) 02/28/2023   Leukocytosis 02/28/2023   Postoperative anemia due to acute blood loss 02/28/2023   S/P aortic aneurysm repair 02/28/2023   S/P AVR (aortic valve replacement) 02/28/2023   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   Hypertension 01/28/2023   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   Bicuspid aortic valve 08/27/2022   Productive cough 03/22/2022   Aneurysm of ascending aorta without rupture (HCC) 07/31/2021   Arrhythmia 10/09/2020   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   Altered mental status 09/22/2020   Dehydration 09/22/2020   Cough 06/03/2020   Hyperglycemia 04/24/2020   Orthostasis 04/24/2020   Vitamin D  deficiency 02/27/2019   Low back pain 02/27/2019   Finger infection 07/20/2018   Right lateral epicondylitis 01/23/2018   Hyponatremia 05/04/2016   Tick bite of left upper arm 05/03/2016   Fever 05/03/2016   Upper respiratory tract infection 05/03/2016   Degenerative arthritis of knee, 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 07/09/2011   Skin lesion 06/24/2011   Hyperlipidemia 06/29/2007   Allergic rhinitis 06/29/2007    Past Surgical History:  Procedure Laterality Date   CARDIAC SURGERY     INGUINAL HERNIA REPAIR     sinus surgury  01/18/1990   dr floy       Home Medications    Prior to Admission medications   Medication Sig Start Date End Date Taking? Authorizing  Provider  amiodarone (PACERONE) 200 MG tablet Take 200 mg by mouth daily. 03/10/23  Yes [provider]  amoxicillin (AMOXIL) 500 MG capsule Take 1,000 mg by mouth 2 (two) times daily. 03/23/23  Yes [provider]  atorvastatin  (LIPITOR) 40 MG tablet Take 40 mg by mouth daily. 03/10/23  Yes [provider]  clobetasol ointment (TEMOVATE) 0.05 % Apply 1 Application topically 2 (two) times daily. 03/30/23  Yes [provider]  clopidogrel (PLAVIX) 75 MG tablet Take 75 mg by mouth daily. 03/10/23  Yes  [provider]  colchicine 0.6 MG tablet Take 0.3 mg by mouth daily. 05/05/23  Yes [provider]  losartan (COZAAR) 50 MG tablet Take 50 mg by mouth daily. 02/23/23  Yes [provider]  metoprolol  succinate (TOPROL -XL) 25 MG 24 hr tablet Take 25 mg by mouth daily. 03/23/23  Yes [provider]  metoprolol  tartrate (LOPRESSOR ) 25 MG tablet Take 25 mg by mouth daily. 03/10/23  Yes [provider]  oxyCODONE (OXY IR/ROXICODONE) 5 MG immediate release tablet Take 5 mg by mouth 4 (four) times daily as needed. 03/10/23  Yes [provider]  benzonatate  (TESSALON ) 100 MG capsule Take 1 capsule by mouth every 8 (eight) hours for cough. 06/29/23   Rolinda Rogue, MD  ferrous sulfate 325 (65 FE) MG tablet Take 325 mg by mouth daily with breakfast.    [provider]  metoprolol  succinate (TOPROL -XL) 50 MG 24 hr tablet Take by mouth. 07/16/21   [provider]  MULTIPLE VITAMIN PO Take by mouth.    [provider]  predniSONE  (DELTASONE ) 20 MG tablet Take 2 tablets (40 mg total) by mouth daily. 06/29/23   Rolinda Rogue, MD    Family History Family History  Problem Relation Age of Onset   Parkinsonism Other    Prostate cancer Other    Hypertension Other    Colon cancer Neg Hx    Rectal cancer Neg Hx    Stomach cancer Neg Hx     Social History Social History   Tobacco Use   Smoking status: Never   Smokeless tobacco: Never  Vaping Use   Vaping status: Never Used  Substance Use Topics   Alcohol use: No   Drug use: No     Allergies   Quinolones, Acetaminophen , Aspirin , Ibuprofen , and Penicillins   Review of Systems Review of Systems  Cardiovascular:  Positive for chest pain.     Physical Exam Triage Vital Signs ED Triage Vitals  Encounter Vitals Group     BP 07/19/23 1559 (S) (!) 148/79     Girls Systolic BP Percentile --      Girls Diastolic BP Percentile --      Boys Systolic BP Percentile --      Boys  Diastolic BP Percentile --      Pulse Rate 07/19/23 1559 73     Resp 07/19/23 1559 20     Temp 07/19/23 1559 97.9 F (36.6 C)     Temp Source 07/19/23 1559 Oral     SpO2 07/19/23 1559 95 %     Weight 07/19/23 1558 214 lb 15.2 oz (97.5 kg)     Height 07/19/23 1558 6' 2 (1.88 m)     Head Circumference --      Peak Flow --      Pain Score 07/19/23 1552 2     Pain Loc --      Pain Education --      Exclude  from Growth Chart --    No data found.  Updated Vital Signs BP (S) (!) 148/79 (BP Location: Left Arm)   Pulse 73   Temp 97.9 F (36.6 C) (Oral)   Resp 20   Ht 6' 2 (1.88 m)   Wt 97.5 kg   SpO2 95%   BMI 27.60 kg/m   Visual Acuity Right Eye Distance:   Left Eye Distance:   Bilateral Distance:    Right Eye Near:   Left Eye Near:    Bilateral Near:     Physical Exam Vitals reviewed.  Constitutional:      General: He is not in acute distress.    Appearance: He is not ill-appearing, toxic-appearing or diaphoretic.  HENT:     Mouth/Throat:     Mouth: Mucous membranes are moist.   Eyes:     Extraocular Movements: Extraocular movements intact.     Conjunctiva/sclera: Conjunctivae normal.     Pupils: Pupils are equal, round, and reactive to light.    Cardiovascular:     Rate and Rhythm: Normal rate and regular rhythm.  Pulmonary:     Effort: Pulmonary effort is normal.     Breath sounds: Normal breath sounds.   Musculoskeletal:     Cervical back: Neck supple.     Right lower leg: No edema.     Left lower leg: No edema.  Lymphadenopathy:     Cervical: No cervical adenopathy.   Skin:    Coloration: Skin is not pale.   Neurological:     General: No focal deficit present.     Mental Status: He is alert and oriented to person, place, and time.   Psychiatric:        Behavior: Behavior normal.      UC Treatments / Results  Labs (all labs ordered are listed, but only abnormal results are displayed) Labs Reviewed - No data to  display  EKG   Radiology No results found.  Procedures Procedures (including critical care time)  Medications Ordered in UC Medications - No data to display  Initial Impression / Assessment and Plan / UC Course  I have reviewed the triage vital signs and the nursing notes.  Pertinent labs & imaging results that were available during my care of the patient were reviewed by me and considered in my medical decision making (see chart for details).     EKG does not show any concerning ST segment elevations or depressions.  Shows normal sinus rhythm.  I have asked him to check his blood pressure and heart rate when he has the spells.  He will keep his follow-up with his cardiologist.  I also asked him to proceed to the emergency room if he has any persistent chest pain. Final Clinical Impressions(s) / UC Diagnoses   Final diagnoses:  Precordial pain     Discharge Instructions      Your EKG did not show any sign of heart attack at this time.  Please try checking your blood pressure and heart rate when you have these spells and write down those numbers.  Keep your follow-up with your cardiologist  If you ever had this chest pain and is lasting more than 5 minutes, please consider going to the emergency room for further evaluation     ED Prescriptions   None    PDMP not reviewed this encounter.   Vonna Sharlet POUR, MD 07/19/23 [phone]

## 2023-07-19 NOTE — Discharge Instructions (Signed)
 Your EKG did not show any sign of heart attack at this time.  Please try checking your blood pressure and heart rate when you have these spells and write down those numbers.  Keep your follow-up with your cardiologist  If you ever had this chest pain and is lasting more than 5 minutes, please consider going to the emergency room for further evaluation

## 2023-07-19 NOTE — ED Triage Notes (Signed)
 I was down in [facility] this morning at an opening ceremony, stopped and had lunch in [place], got home thereafter and noticed left sided chest tightness after a light headed spell. It was uncomfortable and tight, when lying down got clammy and sweaty. I called my Cardiologist, they said they could see me tomorrow but in the mean time will go to UC.   Note: Open heart surgery earlier this year.

## 2023-09-14 ENCOUNTER — Ambulatory Visit: Payer: 59 | Admitting: Internal Medicine

## 2023-10-10 ENCOUNTER — Encounter: Payer: Self-pay | Admitting: Internal Medicine

## 2023-10-10 ENCOUNTER — Ambulatory Visit: Payer: Self-pay | Admitting: Internal Medicine

## 2023-10-10 ENCOUNTER — Ambulatory Visit (INDEPENDENT_AMBULATORY_CARE_PROVIDER_SITE_OTHER): Admitting: Internal Medicine

## 2023-10-10 ENCOUNTER — Other Ambulatory Visit: Payer: Self-pay | Admitting: Internal Medicine

## 2023-10-10 VITALS — BP 112/78 | HR 58 | Temp 97.6°F | Ht 74.0 in | Wt 221.2 lb

## 2023-10-10 DIAGNOSIS — E871 Hypo-osmolality and hyponatremia: Secondary | ICD-10-CM | POA: Diagnosis not present

## 2023-10-10 DIAGNOSIS — E7800 Pure hypercholesterolemia, unspecified: Secondary | ICD-10-CM | POA: Diagnosis not present

## 2023-10-10 DIAGNOSIS — E559 Vitamin D deficiency, unspecified: Secondary | ICD-10-CM

## 2023-10-10 DIAGNOSIS — R739 Hyperglycemia, unspecified: Secondary | ICD-10-CM

## 2023-10-10 DIAGNOSIS — I10 Essential (primary) hypertension: Secondary | ICD-10-CM | POA: Diagnosis not present

## 2023-10-10 DIAGNOSIS — R972 Elevated prostate specific antigen [PSA]: Secondary | ICD-10-CM | POA: Diagnosis not present

## 2023-10-10 DIAGNOSIS — N1831 Chronic kidney disease, stage 3a: Secondary | ICD-10-CM

## 2023-10-10 DIAGNOSIS — E538 Deficiency of other specified B group vitamins: Secondary | ICD-10-CM

## 2023-10-10 LAB — URINALYSIS, ROUTINE W REFLEX MICROSCOPIC
Bilirubin Urine: NEGATIVE
Hgb urine dipstick: NEGATIVE
Ketones, ur: NEGATIVE
Leukocytes,Ua: NEGATIVE
Nitrite: NEGATIVE
RBC / HPF: NONE SEEN (ref 0–?)
Specific Gravity, Urine: 1.01 (ref 1.000–1.030)
Total Protein, Urine: NEGATIVE
Urine Glucose: NEGATIVE
Urobilinogen, UA: 0.2 (ref 0.0–1.0)
WBC, UA: NONE SEEN (ref 0–?)
pH: 6 (ref 5.0–8.0)

## 2023-10-10 LAB — CBC WITH DIFFERENTIAL/PLATELET
Basophils Absolute: 0.1 K/uL (ref 0.0–0.1)
Basophils Relative: 1 % (ref 0.0–3.0)
Eosinophils Absolute: 0.8 K/uL — ABNORMAL HIGH (ref 0.0–0.7)
Eosinophils Relative: 11.1 % — ABNORMAL HIGH (ref 0.0–5.0)
HCT: 43.4 % (ref 39.0–52.0)
Hemoglobin: 14.4 g/dL (ref 13.0–17.0)
Lymphocytes Relative: 23.1 % (ref 12.0–46.0)
Lymphs Abs: 1.7 K/uL (ref 0.7–4.0)
MCHC: 33.2 g/dL (ref 30.0–36.0)
MCV: 87.7 fl (ref 78.0–100.0)
Monocytes Absolute: 0.9 K/uL (ref 0.1–1.0)
Monocytes Relative: 12.5 % — ABNORMAL HIGH (ref 3.0–12.0)
Neutro Abs: 3.8 K/uL (ref 1.4–7.7)
Neutrophils Relative %: 52.3 % (ref 43.0–77.0)
Platelets: 236 K/uL (ref 150.0–400.0)
RBC: 4.95 Mil/uL (ref 4.22–5.81)
RDW: 14.8 % (ref 11.5–15.5)
WBC: 7.2 K/uL (ref 4.0–10.5)

## 2023-10-10 LAB — BASIC METABOLIC PANEL WITH GFR
BUN: 23 mg/dL (ref 6–23)
CO2: 29 meq/L (ref 19–32)
Calcium: 9.3 mg/dL (ref 8.4–10.5)
Chloride: 98 meq/L (ref 96–112)
Creatinine, Ser: 1.22 mg/dL (ref 0.40–1.50)
GFR: 58.19 mL/min — ABNORMAL LOW (ref >60.00)
Glucose, Bld: 85 mg/dL (ref 70–99)
Potassium: 4.8 meq/L (ref 3.5–5.1)
Sodium: 133 meq/L — ABNORMAL LOW (ref 135–145)

## 2023-10-10 LAB — HEMOGLOBIN A1C: Hgb A1c MFr Bld: 6.3 % (ref 4.6–6.5)

## 2023-10-10 LAB — LIPID PANEL
Cholesterol: 194 mg/dL (ref 0–200)
HDL: 37 mg/dL — ABNORMAL LOW (ref >39.00)
LDL Cholesterol: 127 mg/dL — ABNORMAL HIGH (ref 0–99)
NonHDL: 156.93
Total CHOL/HDL Ratio: 5
Triglycerides: 149 mg/dL (ref 0.0–149.0)
VLDL: 29.8 mg/dL (ref 0.0–40.0)

## 2023-10-10 LAB — HEPATIC FUNCTION PANEL
ALT: 23 U/L (ref 0–53)
AST: 23 U/L (ref 0–37)
Albumin: 4.3 g/dL (ref 3.5–5.2)
Alkaline Phosphatase: 74 U/L (ref 39–117)
Bilirubin, Direct: 0.1 mg/dL (ref 0.0–0.3)
Total Bilirubin: 0.4 mg/dL (ref 0.2–1.2)
Total Protein: 7.3 g/dL (ref 6.0–8.3)

## 2023-10-10 LAB — TSH: TSH: 1.16 u[IU]/mL (ref 0.35–5.50)

## 2023-10-10 LAB — PSA: PSA: 9.59 ng/mL — ABNORMAL HIGH (ref 0.10–4.00)

## 2023-10-10 LAB — MICROALBUMIN / CREATININE URINE RATIO
Creatinine,U: 51.5 mg/dL
Microalb Creat Ratio: 27.4 mg/g (ref 0.0–30.0)
Microalb, Ur: 1.4 mg/dL (ref 0.0–1.9)

## 2023-10-10 LAB — VITAMIN B12: Vitamin B-12: 356 pg/mL (ref 211–911)

## 2023-10-10 LAB — VITAMIN D 25 HYDROXY (VIT D DEFICIENCY, FRACTURES): VITD: 25.07 ng/mL — ABNORMAL LOW (ref 30.00–100.00)

## 2023-10-10 MED ORDER — ATORVASTATIN CALCIUM 40 MG PO TABS: 40.0000 mg | ORAL_TABLET | Freq: Every day | ORAL | 3 refills | Status: AC

## 2023-10-10 NOTE — Assessment & Plan Note (Signed)
 Lab Results  Component Value Date   CREATININE 1.09 05/01/2023   Stable overall, cont to avoid nephrotoxins

## 2023-10-10 NOTE — Assessment & Plan Note (Signed)
 Lab Results  Component Value Date   LDLCALC 99 01/28/2023   Uncontrolled , cont lower chol diet, for f/u lab today

## 2023-10-10 NOTE — Progress Notes (Signed)
 Patient ID: CHEVEZ SAMBRANO, male   DOB: 1950-03-05, 73 y.o.   MRN: [id number]        Chief Complaint: follow up HTN, HLD and hyperglycemia , low vit d, elevated psa, ckd3a       HPI:  ASER NYLUND is a 73 y.o. male here overall doing ok; Did well with cardiac surgury with aortic graft, CABG and valve replacement bovine valve, but since then had to f/u with 2 kidney MD (b/c the first at [facility] was too long to get into given his level of concern at [facility])  Sodium stable per pt. Has also seen Cardiology with reassurance  Finished 12 wks PT, now doing some gym  Pt denies chest pain, increased sob or doe, wheezing, orthopnea, PND, increased LE swelling, palpitations, dizziness or syncope.   Pt denies polydipsia, polyuria, or new focal neuro s/s.   Pt denies fever, wt loss, night sweats, loss of appetite, or other constitutional symptoms  Saw urology but elev psa felt to be transient benign per Dr Alvaro as f/u psa was much improved Denies urinary symptoms such as dysuria, frequency, urgency, flank pain, hematuria or n/v, fever, chills.  NOTE:  pt currently not taking lipitor 40 mg , has been working on [employer] Readings from Last 3 Encounters:  10/10/23 221 lb 3.2 oz (100.3 kg)  07/19/23 214 lb 15.2 oz (97.5 kg)  05/01/23 215 lb (97.5 kg)   BP Readings from Last 3 Encounters:  10/10/23 112/78  07/19/23 (S) (!) 148/79  06/29/23 133/80         Past Medical History:  Diagnosis Date   ALLERGIC RHINITIS 06/29/2007   Qualifier: Diagnosis of  By: Norleen MD, Lynwood ORN    Allergy    seasonal   HYPERLIPIDEMIA 06/29/2007   Qualifier: Diagnosis of  By: Norleen MD, Lynwood ORN    Right inguinal hernia 06/24/2011   Past Surgical History:  Procedure Laterality Date   CARDIAC SURGERY     INGUINAL HERNIA REPAIR     sinus surgury  01/18/1990   dr floy    reports that he has never smoked. He has never used smokeless tobacco. He reports that he does not drink alcohol and does not use drugs. family history includes  Hypertension in an other family member; Parkinsonism in an other family member; Prostate cancer in an other family member. Allergies  Allergen Reactions   Quinolones Other (See Comments)    Fluroquinolone antibiotics should be avoided in patients with aortic disease unless alternative therapy is not an option.   Acetaminophen      REACTION: eyes swell Per family, patient takes [employer] powders   Aspirin      REACTION: eyes swell   Ibuprofen      REACTION: eyes swell   Penicillins     REACTION: pt cant remember   Current Outpatient Medications on File Prior to Visit  Medication Sig Dispense Refill   metoprolol  succinate (TOPROL -XL) 25 MG 24 hr tablet Take 25 mg by mouth daily.     clobetasol ointment (TEMOVATE) 0.05 % Apply 1 Application topically 2 (two) times daily. (Patient not taking: Reported on 10/10/2023)     metoprolol  tartrate (LOPRESSOR ) 25 MG tablet Take 25 mg by mouth daily. (Patient not taking: Reported on 10/10/2023)     No current facility-administered medications on file prior to visit.        ROS:  All others reviewed and negative.  Objective        PE:  BP  112/78   Pulse (!) 58   Temp 97.6 F (36.4 C)   Ht 6' 2 (1.88 m)   Wt 221 lb 3.2 oz (100.3 kg)   SpO2 98%   BMI 28.40 kg/m                 Constitutional: Pt appears in NAD               HENT: Head: NCAT.                Right Ear: External ear normal.                 Left Ear: External ear normal.                Eyes: . Pupils are equal, round, and reactive to light. Conjunctivae and EOM are normal               Nose: without d/c or deformity               Neck: Neck supple. Gross normal ROM               Cardiovascular: Normal rate and regular rhythm.                 Pulmonary/Chest: Effort normal and breath sounds without rales or wheezing.                Abd:  Soft, NT, ND, + BS, no organomegaly               Neurological: Pt is alert. At baseline orientation, motor grossly intact               Skin:  Skin is warm. No rashes, no other new lesions, LE edema - none               Psychiatric: Pt behavior is normal without agitation   Micro: none  Cardiac tracings I have personally interpreted today:  none  Pertinent Radiological findings (summarize): none   Lab Results  Component Value Date   WBC 7.6 05/01/2023   HGB 13.4 05/01/2023   HCT 40.8 05/01/2023   PLT 270 05/01/2023   GLUCOSE 90 05/01/2023   CHOL 173 01/28/2023   TRIG 220.0 (H) 01/28/2023   HDL 29.60 (L) 01/28/2023   LDLCALC 99 01/28/2023   ALT 41 03/17/2023   AST 23 03/17/2023   NA 128 (L) 05/01/2023   K 4.6 05/01/2023   CL 94 (L) 05/01/2023   CREATININE 1.09 05/01/2023   BUN 16 05/01/2023   CO2 24 05/01/2023   TSH 1.00 01/28/2023   PSA 16.16 (H) 03/17/2023   INR 1.3 (H) 09/22/2020   HGBA1C 6.4 01/28/2023   Assessment/Plan:  KENNIS WISSMANN is a 73 y.o. White or Caucasian [1] male with  has a past medical history of ALLERGIC RHINITIS (06/29/2007), Allergy, HYPERLIPIDEMIA (06/29/2007), and Right inguinal hernia (06/24/2011).  Elevated PSA Lab Results  Component Value Date   PSA 16.16 (H) 03/17/2023   PSA 9.77 (H) 01/28/2023   PSA 5.16 (H) 04/24/2020   Uncontrolled worsening, but felt to be benign transient, f/u urology as planned, for f/u psa today  Vitamin D  deficiency Last vitamin D  Lab Results  Component Value Date   VD25OH 26.30 (L) 01/28/2023   Low, to start oral replacement   Hyponatremia Also for f/u lab, asympt  Hypertension BP Readings from Last 3 Encounters:  10/10/23 112/78  07/19/23 (S) (!) 148/79  06/29/23 133/80   Stable, pt to continue medical treatment toprol  xl 25 mg every day, lopressor  25 mg qd   Hyperlipidemia Lab Results  Component Value Date   LDLCALC 99 01/28/2023   Uncontrolled , cont lower chol diet, for f/u lab today   Hyperglycemia Lab Results  Component Value Date   HGBA1C 6.4 01/28/2023   Stable, pt to continue current medical treatment  - diet, wt  control   CKD stage 3a, GFR 45-59 ml/min (HCC) Lab Results  Component Value Date   CREATININE 1.09 05/01/2023   Stable overall, cont to avoid nephrotoxins  Followup: Return in about 6 months (around 04/08/2024).  Lynwood Rush, MD 10/10/2023 12:49 PM [facility]

## 2023-10-10 NOTE — Assessment & Plan Note (Signed)
 Also for f/u lab, asympt

## 2023-10-10 NOTE — Assessment & Plan Note (Signed)
 Lab Results  Component Value Date   HGBA1C 6.4 01/28/2023   Stable, pt to continue current medical treatment  - diet, wt control

## 2023-10-10 NOTE — Assessment & Plan Note (Signed)
 BP Readings from Last 3 Encounters:  10/10/23 112/78  07/19/23 (S) (!) 148/79  06/29/23 133/80   Stable, pt to continue medical treatment toprol  xl 25 mg every day, lopressor  25 mg qd

## 2023-10-10 NOTE — Assessment & Plan Note (Signed)
 Last vitamin D Lab Results  Component Value Date   VD25OH 26.30 (L) 01/28/2023   Low, to start oral replacement

## 2023-10-10 NOTE — Patient Instructions (Signed)

## 2023-10-10 NOTE — Assessment & Plan Note (Addendum)
 Lab Results  Component Value Date   PSA 16.16 (H) 03/17/2023   PSA 9.77 (H) 01/28/2023   PSA 5.16 (H) 04/24/2020   Uncontrolled worsening, but felt to be benign transient, f/u urology as planned, for f/u psa today

## 2023-11-30 ENCOUNTER — Ambulatory Visit: Admitting: Physician Assistant

## 2024-04-09 ENCOUNTER — Ambulatory Visit: Admitting: Internal Medicine
